# Patient Record
Sex: Female | Born: 1970 | Race: Black or African American | Hispanic: No | Marital: Single | Smoking: Never smoker
Health system: Southern US, Community
[De-identification: ages and names within clinical notes are randomized; demographics above are authoritative.]

## PROBLEM LIST (undated history)

## (undated) DIAGNOSIS — K219 Gastro-esophageal reflux disease without esophagitis: Secondary | ICD-10-CM

## (undated) DIAGNOSIS — N3281 Overactive bladder: Secondary | ICD-10-CM

## (undated) DIAGNOSIS — R053 Chronic cough: Secondary | ICD-10-CM

## (undated) DIAGNOSIS — D219 Benign neoplasm of connective and other soft tissue, unspecified: Secondary | ICD-10-CM

## (undated) DIAGNOSIS — I10 Essential (primary) hypertension: Secondary | ICD-10-CM

## (undated) DIAGNOSIS — N390 Urinary tract infection, site not specified: Secondary | ICD-10-CM

## (undated) DIAGNOSIS — U071 COVID-19: Secondary | ICD-10-CM

## (undated) DIAGNOSIS — R05 Cough: Secondary | ICD-10-CM

---

## 2006-02-05 HISTORY — PX: ABDOMINAL HYSTERECTOMY: SHX81

## 2011-11-01 ENCOUNTER — Other Ambulatory Visit: Payer: Self-pay

## 2011-11-01 ENCOUNTER — Encounter (HOSPITAL_COMMUNITY): Payer: Self-pay | Admitting: *Deleted

## 2011-11-01 ENCOUNTER — Emergency Department (HOSPITAL_COMMUNITY): Payer: Medicaid - Out of State

## 2011-11-01 ENCOUNTER — Observation Stay (HOSPITAL_COMMUNITY)
Admission: EM | Admit: 2011-11-01 | Discharge: 2011-11-02 | Disposition: A | Discharge status: Home / Self Care | Payer: Medicaid - Out of State | Attending: Emergency Medicine | Admitting: Emergency Medicine

## 2011-11-01 DIAGNOSIS — J45909 Unspecified asthma, uncomplicated: Secondary | ICD-10-CM | POA: Insufficient documentation

## 2011-11-01 DIAGNOSIS — Z79899 Other long term (current) drug therapy: Secondary | ICD-10-CM | POA: Insufficient documentation

## 2011-11-01 DIAGNOSIS — R079 Chest pain, unspecified: Principal | ICD-10-CM | POA: Insufficient documentation

## 2011-11-01 DIAGNOSIS — R0602 Shortness of breath: Secondary | ICD-10-CM | POA: Insufficient documentation

## 2011-11-01 LAB — BASIC METABOLIC PANEL
BUN: 14 mg/dL (ref 6–23)
CO2: 27 mEq/L (ref 19–32)
Calcium: 9.2 mg/dL (ref 8.4–10.5)
Chloride: 106 mEq/L (ref 96–112)
Creatinine, Ser: 0.82 mg/dL (ref 0.50–1.10)
GFR calc Af Amer: >90 mL/min (ref >90)
GFR calc non Af Amer: 87 mL/min — ABNORMAL LOW (ref >90)
Glucose, Bld: 86 mg/dL (ref 70–99)
Potassium: 4.2 mEq/L (ref 3.5–5.1)
Sodium: 141 mEq/L (ref 135–145)

## 2011-11-01 LAB — CBC
HCT: 35.4 % — ABNORMAL LOW (ref 36.0–46.0)
Hemoglobin: 11.6 g/dL — ABNORMAL LOW (ref 12.0–15.0)
MCH: 25.9 pg — ABNORMAL LOW (ref 26.0–34.0)
MCHC: 32.8 g/dL (ref 30.0–36.0)
MCV: 79 fL (ref 78.0–100.0)
Platelets: 268 10*3/uL (ref 150–400)
RBC: 4.48 MIL/uL (ref 3.87–5.11)
RDW: 12.5 % (ref 11.5–15.5)
WBC: 8.7 10*3/uL (ref 4.0–10.5)

## 2011-11-01 LAB — TROPONIN I: Troponin I: <0.3 ng/mL (ref <0.30)

## 2011-11-01 LAB — POCT I-STAT TROPONIN I
Troponin i, poc: 0 ng/mL (ref 0.00–0.08)
Troponin i, poc: 0 ng/mL (ref 0.00–0.08)

## 2011-11-01 LAB — DIFFERENTIAL
Basophils Absolute: 0 10*3/uL (ref 0.0–0.1)
Basophils Relative: 0 % (ref 0–1)
Eosinophils Absolute: 0.2 10*3/uL (ref 0.0–0.7)
Eosinophils Relative: 2 % (ref 0–5)
Lymphocytes Relative: 38 % (ref 12–46)
Lymphs Abs: 3.4 10*3/uL (ref 0.7–4.0)
Monocytes Absolute: 0.8 10*3/uL (ref 0.1–1.0)
Monocytes Relative: 9 % (ref 3–12)
Neutro Abs: 4.4 10*3/uL (ref 1.7–7.7)
Neutrophils Relative %: 51 % (ref 43–77)

## 2011-11-01 MED ORDER — ASPIRIN 81 MG PO CHEW
324.0000 mg | CHEWABLE_TABLET | Freq: Once | ORAL | Status: AC
  Administered 2011-11-01: 324 mg via ORAL
  Filled 2011-11-01: qty 4

## 2011-11-01 MED ORDER — NITROGLYCERIN 0.4 MG SL SUBL
0.4000 mg | SUBLINGUAL_TABLET | SUBLINGUAL | Status: DC | PRN
Start: 2011-11-01 — End: 2011-11-02
  Administered 2011-11-01 (×2): 0.4 mg via SUBLINGUAL

## 2011-11-01 MED ORDER — ACETAMINOPHEN 325 MG PO TABS
650.0000 mg | ORAL_TABLET | Freq: Once | ORAL | Status: AC
  Administered 2011-11-01: 650 mg via ORAL
  Filled 2011-11-01: qty 2

## 2011-11-01 NOTE — ED Notes (Signed)
Pt given [place] sandwich, pt informed that at 04:00 on 11/02/11 she is to be NPO

## 2011-11-01 NOTE — ED Provider Notes (Cosign Needed)
History     CSN: [id number]  Arrival date & time 11/01/11  1726   First MD Initiated Contact with Patient 11/01/11 1815      Chief Complaint  Patient presents with  . Chest Pain    (Consider location/radiation/quality/duration/timing/severity/associated sxs/prior treatment) Patient is a 41 y.o. female presenting with chest pain. The history is provided by the patient.  Chest Pain Primary symptoms include shortness of breath and palpitations. Pertinent negatives for primary symptoms include no fever, no cough, no abdominal pain, no nausea and no vomiting.  The palpitations also occurred with shortness of breath.     the patient is a 41 year old, female, with a history of asthma, who presents to emergency department complaining of intermittent chest pain.  For the last 2 days.  She says the pain is on the left side.  It goes under her breast and into her left arm.  It lasts minutes at a time.  He comes and goes.  Several times a day.  It occurs when she is ambulatory, as well as when she is at rest.  She is shortness of breath, associated with it.  She has not had nausea or vomiting associated with it.  Last night.  She was diaphoretic while she was in bed.  She denies leg pain or swelling.  She does not smoke cigarettes.  She denies diabetes or hypertension.  She denies a family history of coronary artery disease.  She took aspirin last 2 days for her pain.  Her symptoms have persisted, and, therefore, she came to the emergency department for further evaluation.  She is recently moved from [place] and does not have a local physician.  Past Medical History  Diagnosis Date  . Asthma     History reviewed. No pertinent past surgical history.  History reviewed. No pertinent family history.  History  Substance Use Topics  . Smoking status: Not on file  . Smokeless tobacco: Not on file  . Alcohol Use: No    OB History    Grav Para Term Preterm Abortions TAB SAB Ect Mult Living                Review of Systems  Constitutional: Negative for fever and chills.  Respiratory: Positive for shortness of breath. Negative for cough.   Cardiovascular: Positive for chest pain and palpitations. Negative for leg swelling.  Gastrointestinal: Negative for nausea, vomiting and abdominal pain.  Neurological: Negative for headaches.  Psychiatric/Behavioral: Negative for confusion.  All other systems reviewed and are negative.    Allergies  Review of patient's allergies indicates no known allergies.  Home Medications   Current Outpatient Rx  Name Route Sig Dispense Refill  . ALBUTEROL SULFATE HFA 108 (90 BASE) MCG/ACT IN AERS Inhalation Inhale 2 puffs into the lungs every 6 (six) hours as needed. For shortness of breath    . FLUTICASONE-SALMETEROL 250-50 MCG/DOSE IN AEPB Inhalation Inhale 1 puff into the lungs every 12 (twelve) hours.    Marland Kitchen LORATADINE 10 MG PO TABS Oral Take 10 mg by mouth daily.      BP 132/89  Pulse 85  Temp(Src) 98.7 F (37.1 C) (Oral)  Resp 18  SpO2 100%  Physical Exam  Vitals reviewed. Constitutional: She is oriented to person, place, and time. She appears well-developed and well-nourished.  HENT:  Head: Normocephalic and atraumatic.  Eyes: Pupils are equal, round, and reactive to light.  Neck: Normal range of motion.  Cardiovascular: Normal rate, regular rhythm and normal heart  sounds.   No murmur heard. Pulmonary/Chest: Effort normal and breath sounds normal. No respiratory distress. She has no wheezes. She has no rales. She exhibits tenderness.       Left-sided chest wall tenderness  Abdominal: Soft. She exhibits no distension and no mass. There is no tenderness. There is no rebound and no guarding.  Musculoskeletal: Normal range of motion. She exhibits no edema and no tenderness.  Neurological: She is alert and oriented to person, place, and time. No cranial nerve deficit.  Skin: Skin is warm and dry. No rash noted. No erythema.    Psychiatric: She has a normal mood and affect. Her behavior is normal.    ED Course  Procedures (including critical care time) 41 year old, female, with no risk factors for coronary artery disease.  Has been having intermittent chest pain, which radiates into her left arm, associated with shortness of breath.  For the past 2 days.  Her symptoms occur both at rest and with exertion.  We will perform laboratory testing.  An EKG, and chest x-ray, and treat her with nitroglycerin to control her pain  Labs Reviewed  CBC - Abnormal; Notable for the following:    Hemoglobin 11.6 (*)    HCT 35.4 (*)    MCH 25.9 (*)    All other components within normal limits  DIFFERENTIAL  BASIC METABOLIC PANEL   No results found.   No diagnosis found.  Still says pain is intermittent.  It "comes and goes."  Discussed with cardiologist.  He suggested CDU CP protocol.  Discussed with the pa.  She will place on protocol.  MDM  Chest pain Possible angina.   No signs stemi or cardiac damage now.Nicholes Stairs, MD 11/01/11 2034

## 2011-11-01 NOTE — ED Notes (Signed)
Pt undressed, in gown, on monitor, continuous pulse oximetry and blood pressure cuff; family at bedside 

## 2011-11-01 NOTE — ED Notes (Signed)
Reports mid chest pains intermittent x 2 days, having mild sob with exertion. ekg done at triage, no acute distress noted at triage.

## 2011-11-01 NOTE — ED Notes (Signed)
Patient ambulated to bathroom. Gait steady. No complaints at this time 

## 2011-11-01 NOTE — ED Provider Notes (Signed)
Assumed care of patient in the CDU from Dr. Weldon Inches.  Patient put on Chest Pain Protocol.  Patient came into the ED today with chest pain.  Cardiology was already been consulted and recommended putting patient on the CDU chest pain protocol.   Patient given Nitroglycerin and Aspirin prior to transfer to CDU. Patient's CXR was negative.  Patient will have CT coronary in the morning.  9:00 PM Reassessed patient.  Patient reports that her chest pain has improved.  Patient is not feeling short of breath at this time.  Patient resting comfortably.  VSS.  Alert and orientated x 3, Heart RRR, Lungs CTAB.  Mild left sided chest wall tenderness to palpation.  Patient awaiting CT coronary in the morning.  12:16 AM Reassessed patient.  Patient resting comfortably.  VSS.  Alert and orientated.  Patient reports that her pain is controlled at this time.    Pascal Lux [place], PA-C 11/02/11 [phone]

## 2011-11-02 ENCOUNTER — Observation Stay (HOSPITAL_COMMUNITY): Payer: Medicaid - Out of State

## 2011-11-02 LAB — POCT I-STAT TROPONIN I: Troponin i, poc: 0 ng/mL (ref 0.00–0.08)

## 2011-11-02 MED ORDER — METOPROLOL TARTRATE 25 MG PO TABS: 100.0000 mg | ORAL_TABLET | Freq: Once | ORAL | Status: DC

## 2011-11-02 MED ORDER — METOPROLOL TARTRATE 1 MG/ML IV SOLN
INTRAVENOUS | Status: AC
  Filled 2011-11-02: qty 5

## 2011-11-02 MED ORDER — NITROGLYCERIN 0.4 MG SL SUBL
SUBLINGUAL_TABLET | SUBLINGUAL | Status: AC
  Filled 2011-11-02: qty 25

## 2011-11-02 MED ORDER — METOPROLOL TARTRATE 1 MG/ML IV SOLN
5.0000 mg | Freq: Once | INTRAVENOUS | Status: AC
  Administered 2011-11-02: 5 mg via INTRAVENOUS

## 2011-11-02 MED ORDER — METOPROLOL TARTRATE 25 MG PO TABS
50.0000 mg | ORAL_TABLET | Freq: Once | ORAL | Status: AC
  Administered 2011-11-02: 100 mg via ORAL
  Filled 2011-11-02 (×2): qty 2

## 2011-11-02 MED ORDER — METOPROLOL TARTRATE 25 MG PO TABS: 50.0000 mg | ORAL_TABLET | Freq: Once | ORAL | Status: DC

## 2011-11-02 MED ORDER — NITROGLYCERIN 0.4 MG SL SUBL
0.4000 mg | SUBLINGUAL_TABLET | Freq: Once | SUBLINGUAL | Status: AC
  Administered 2011-11-02: 0.4 mg via SUBLINGUAL

## 2011-11-02 NOTE — ED Provider Notes (Signed)
7:23 AM patient is in CDU under observation-chest pain protocol.  Patient reports no problems overnight.  Denies any chest pain or shortness of breath currently.  On exam, pt is A&Ox4, NAD, RRR, no m/r/g, CTAB, abd soft, NT, extremities without edema, distal pulses intact and equal bilaterally.  Plan is for coronary CT this morning.  Will continue to follow.  Received a call from Dr Reche Dixon.  Study is negative.  Patient to be discharged home with PCP follow up.  Discussed findings with patient.  Patient verbalizes understanding and agrees with plan.   Rise Patience, [place] 11/02/11 1140

## 2011-11-02 NOTE — ED Provider Notes (Signed)
Medical screening examination/treatment/procedure(s) were conducted as a shared visit with non-physician practitioner(s) and myself.  I personally evaluated the patient during the encounter  Nicholes Stairs, MD 11/02/11 [phone]

## 2011-11-02 NOTE — Progress Notes (Signed)
Observation review is complete. 

## 2011-11-02 NOTE — ED Provider Notes (Signed)
Medical screening examination/treatment/procedure(s) were conducted as a shared visit with non-physician practitioner(s) and myself.  I personally evaluated the patient during the encounter  Joyce Stairs, MD 11/02/11 [phone]

## 2012-07-15 ENCOUNTER — Other Ambulatory Visit: Payer: Self-pay | Admitting: Internal Medicine

## 2012-07-15 DIAGNOSIS — R109 Unspecified abdominal pain: Secondary | ICD-10-CM

## 2012-07-22 ENCOUNTER — Ambulatory Visit
Admission: RE | Admit: 2012-07-22 | Discharge: 2012-07-22 | Disposition: A | Discharge status: Home / Self Care | Payer: Medicaid Other | Source: Ambulatory Visit | Attending: Internal Medicine | Admitting: Internal Medicine

## 2012-07-22 DIAGNOSIS — R109 Unspecified abdominal pain: Secondary | ICD-10-CM

## 2012-08-19 ENCOUNTER — Encounter (HOSPITAL_COMMUNITY): Payer: Self-pay | Admitting: *Deleted

## 2012-08-19 ENCOUNTER — Emergency Department (HOSPITAL_COMMUNITY)
Admission: EM | Admit: 2012-08-19 | Discharge: 2012-08-19 | Disposition: A | Discharge status: Home / Self Care | Payer: Medicaid Other | Source: Home / Self Care

## 2012-08-19 DIAGNOSIS — S39012A Strain of muscle, fascia and tendon of lower back, initial encounter: Secondary | ICD-10-CM

## 2012-08-19 DIAGNOSIS — S335XXA Sprain of ligaments of lumbar spine, initial encounter: Secondary | ICD-10-CM

## 2012-08-19 HISTORY — DX: Overactive bladder: N32.81

## 2012-08-19 LAB — POCT URINALYSIS DIP (DEVICE)
Bilirubin Urine: NEGATIVE
Glucose, UA: NEGATIVE mg/dL
Ketones, ur: NEGATIVE mg/dL
Leukocytes, UA: NEGATIVE
Nitrite: NEGATIVE
Protein, ur: NEGATIVE mg/dL
Specific Gravity, Urine: 1.015 (ref 1.005–1.030)
Urobilinogen, UA: 0.2 mg/dL (ref 0.0–1.0)
pH: 6 (ref 5.0–8.0)

## 2012-08-19 MED ORDER — TRAMADOL HCL 50 MG PO TABS: 50.0000 mg | ORAL_TABLET | Freq: Four times a day (QID) | ORAL | Status: DC | PRN

## 2012-08-19 NOTE — ED Notes (Signed)
Pt reports " i just feel bad. I have so many things going on - it started Thursday. My ears hurts and my chest is sore and I started coughing last night. My lower back hurts."  Pt denies burning when she pees - has hx of OAB

## 2012-08-19 NOTE — ED Notes (Signed)
Medication called into [facility] on [address]. Tramadol 50mg  Q6 hours PRN for pain. 15 tablets.

## 2012-08-19 NOTE — ED Provider Notes (Signed)
History     CSN: [id number]  Arrival date & time 08/19/12  1134   None     Chief Complaint  Patient presents with  . URI  . Back Pain  . Hemorrhoids    (Consider location/radiation/quality/duration/timing/severity/associated sxs/prior treatment) HPI Comments: 41 year old female with chief complaint of low back pain for 4 days. She denies any known injury or move that may have produced her pain. There is pain and tenderness the left low back in the para lumbar musculature. Is worse with sitting and better with standing. She denies any focal neurologic symptoms or radicular pain. She denies urinary symptoms.  2 weeks ago she had an ear infection and saw her PCP. She was treated with an antibiotic and now that is somewhat better but she still had some ear discomfort.  She does not have any urinary tract symptoms.  Patient is a 41 y.o. female presenting with URI and back pain.  URI The primary symptoms include fatigue and ear pain. Primary symptoms do not include fever or sore throat.  Symptoms associated with the illness include congestion.  Back Pain  Pertinent negatives include no fever.    Past Medical History  Diagnosis Date  . Asthma   . OAB (overactive bladder)     Past Surgical History  Procedure Date  . Abdominal hysterectomy     Family History  Problem Relation Age of Onset  . Family history unknown: Yes    History  Substance Use Topics  . Smoking status: Not on file  . Smokeless tobacco: Not on file  . Alcohol Use: No    OB History    Grav Para Term Preterm Abortions TAB SAB Ect Mult Living   1 1 1       1       Review of Systems  Constitutional: Positive for fatigue. Negative for fever.  HENT: Positive for ear pain and congestion. Negative for sore throat, neck stiffness and ear discharge.   Respiratory: Negative.   Cardiovascular: Negative.   Gastrointestinal: Negative.   Genitourinary: Negative.   Musculoskeletal: Positive for back pain.    Neurological: Negative.   Psychiatric/Behavioral: Negative.     Allergies  Review of patient's allergies indicates no known allergies.  Home Medications   Current Outpatient Rx  Name  Route  Sig  Dispense  Refill  . LORATADINE 10 MG PO TABS   Oral   Take 10 mg by mouth daily.         . ALBUTEROL SULFATE HFA 108 (90 BASE) MCG/ACT IN AERS   Inhalation   Inhale 2 puffs into the lungs every 6 (six) hours as needed. For shortness of breath         . BISACODYL 5 MG PO TBEC   Oral   Take 5 mg by mouth daily as needed.         Marland Kitchen FLUTICASONE-SALMETEROL 250-50 MCG/DOSE IN AEPB   Inhalation   Inhale 1 puff into the lungs every 12 (twelve) hours.         . SOLIFENACIN SUCCINATE 10 MG PO TABS   Oral   Take by mouth daily.           BP 128/82  Pulse 78  Temp 98.6 F (37 C) (Oral)  Resp 18  SpO2 100%  Physical Exam  Constitutional: She is oriented to person, place, and time. She appears well-developed and well-nourished. No distress.  HENT:  Mouth/Throat: No oropharyngeal exudate.       Bilateral  TMs without erythema. They are pearly gray and translucent without effusion. The left TM has mild retraction. Oropharynx is clear with scant clear PND  Eyes: Conjunctivae normal and EOM are normal.  Neck: Normal range of motion. Neck supple.  Pulmonary/Chest: Effort normal.  Musculoskeletal: Normal range of motion. She exhibits no edema and no tenderness.  Lymphadenopathy:    She has no cervical adenopathy.  Neurological: She is alert and oriented to person, place, and time. No cranial nerve deficit.  Skin: Skin is warm and dry.  Psychiatric: She has a normal mood and affect.    ED Course  Procedures (including critical care time)  Labs Reviewed  POCT URINALYSIS DIP (DEVICE) - Abnormal; Notable for the following:    Hgb urine dipstick SMALL (*)     All other components within normal limits   No results found.   1. Lumbar strain       MDM  Apply heat to  the sore areas as directed. Stretches as demonstrated. No Heavy lifting pulling pushing May take ibuprofen at home when necessary as previously directed. For any upper respiratory congestion head phenylephrine 10 mg by mouth every 4 hours as needed. He may take this along with your loratadine.        Hayden Rasmussen, NP 08/19/12 1402

## 2012-08-20 NOTE — ED Provider Notes (Signed)
Medical screening examination/treatment/procedure(s) were performed by resident physician or non-physician practitioner and as supervising physician I was immediately available for consultation/collaboration.   KINDL,JAMES DOUGLAS MD.    James D Kindl, MD 08/20/12 1048 

## 2012-12-26 ENCOUNTER — Other Ambulatory Visit: Payer: Self-pay

## 2012-12-26 ENCOUNTER — Other Ambulatory Visit: Payer: Self-pay | Admitting: Internal Medicine

## 2012-12-26 DIAGNOSIS — Z1231 Encounter for screening mammogram for malignant neoplasm of breast: Secondary | ICD-10-CM

## 2013-01-06 ENCOUNTER — Emergency Department (HOSPITAL_COMMUNITY)
Admission: EM | Admit: 2013-01-06 | Discharge: 2013-01-06 | Disposition: A | Discharge status: Home / Self Care | Payer: Medicaid Other | Attending: Emergency Medicine | Admitting: Emergency Medicine

## 2013-01-06 ENCOUNTER — Encounter (HOSPITAL_COMMUNITY): Payer: Self-pay | Admitting: *Deleted

## 2013-01-06 DIAGNOSIS — M545 Low back pain, unspecified: Secondary | ICD-10-CM | POA: Insufficient documentation

## 2013-01-06 DIAGNOSIS — R110 Nausea: Secondary | ICD-10-CM | POA: Insufficient documentation

## 2013-01-06 DIAGNOSIS — R350 Frequency of micturition: Secondary | ICD-10-CM | POA: Insufficient documentation

## 2013-01-06 DIAGNOSIS — Z90710 Acquired absence of both cervix and uterus: Secondary | ICD-10-CM | POA: Insufficient documentation

## 2013-01-06 DIAGNOSIS — Z79899 Other long term (current) drug therapy: Secondary | ICD-10-CM | POA: Insufficient documentation

## 2013-01-06 DIAGNOSIS — Z8742 Personal history of other diseases of the female genital tract: Secondary | ICD-10-CM | POA: Insufficient documentation

## 2013-01-06 DIAGNOSIS — J45909 Unspecified asthma, uncomplicated: Secondary | ICD-10-CM | POA: Insufficient documentation

## 2013-01-06 LAB — URINALYSIS, ROUTINE W REFLEX MICROSCOPIC
Bilirubin Urine: NEGATIVE
Glucose, UA: NEGATIVE mg/dL
Hgb urine dipstick: NEGATIVE
Ketones, ur: NEGATIVE mg/dL
Leukocytes, UA: NEGATIVE
Nitrite: NEGATIVE
Protein, ur: NEGATIVE mg/dL
Specific Gravity, Urine: 1.016 (ref 1.005–1.030)
Urobilinogen, UA: 0.2 mg/dL (ref 0.0–1.0)
pH: 6.5 (ref 5.0–8.0)

## 2013-01-06 MED ORDER — TRAMADOL HCL 50 MG PO TABS: 50.0000 mg | ORAL_TABLET | Freq: Four times a day (QID) | ORAL | Status: DC | PRN

## 2013-01-06 MED ORDER — PHENAZOPYRIDINE HCL 200 MG PO TABS: 200.0000 mg | ORAL_TABLET | Freq: Three times a day (TID) | ORAL | Status: DC

## 2013-01-06 NOTE — ED Notes (Signed)
Pt states she was dx with UTI 1 week ago, finished pyridium and pain has gotten worse since.  Denies n/v, no fevers/chills.  Pt is still finishing abx course.

## 2013-01-06 NOTE — ED Notes (Signed)
Patient is alert and orientedx4.  Patient was explained discharge instructions and they understood them with no questions.   

## 2013-01-06 NOTE — ED Notes (Signed)
Patient went to her PCP for the same thing and they gave her Cipro and Pyridium.  The patient says she has been urinating on herself and her pain has gotten worse.  The patient decided to come to the ED to be evaluated.

## 2013-01-06 NOTE — ED Provider Notes (Signed)
History  This chart was scribed for non-physician practitioner working with Glynn Octave, MD by Erskine Emery, ED Scribe. This patient was seen in room TR07C/TR07C and the patient's care was started at 21:17.   CSN: [id number]  Arrival date & time 01/06/13  2117   First MD Initiated Contact with Patient 01/06/13 2210      Chief Complaint  Patient presents with  . Urinary Frequency    (Consider location/radiation/quality/duration/timing/severity/associated sxs/prior treatment) The history is provided by the patient. No language interpreter was used.  Joyce Welch is a 42 y.o. female who presents to the Emergency Department complaining of urinary frequency. Pt was tx for a UTI last week and prescribed pyridium which she finished and antibiotics (Cipro) which she is still taking. She claims her pain has only gotten worse since then. She reports some associated back pain and nausea.  Denies any dysuria, hematuria, abdominal pain, fever, chills, or vaginal discharge. Pt has a h/o overactive bladder but no h/o kidney stones.  No loss of bowel or bladder function.  Past Medical History  Diagnosis Date  . Asthma   . OAB (overactive bladder)     Past Surgical History  Procedure Laterality Date  . Abdominal hysterectomy    . Cesarean section      History reviewed. No pertinent family history.  History  Substance Use Topics  . Smoking status: Never Smoker   . Smokeless tobacco: Not on file  . Alcohol Use: No    OB History   Grav Para Term Preterm Abortions TAB SAB Ect Mult Living   1 1 1       1       Review of Systems  Constitutional: Negative for fever.  Genitourinary: Positive for frequency.  Musculoskeletal: Positive for back pain.  All other systems reviewed and are negative.    Allergies  Review of patient's allergies indicates no known allergies.  Home Medications   Current Outpatient Rx  Name  Route  Sig  Dispense  Refill  . albuterol (PROVENTIL HFA;VENTOLIN  HFA) 108 (90 BASE) MCG/ACT inhaler   Inhalation   Inhale 2 puffs into the lungs every 6 (six) hours as needed. For shortness of breath         . bisacodyl (DULCOLAX) 5 MG EC tablet   Oral   Take 5 mg by mouth daily as needed.         . Fluticasone-Salmeterol (ADVAIR) 250-50 MCG/DOSE AEPB   Inhalation   Inhale 1 puff into the lungs every 12 (twelve) hours.         Marland Kitchen loratadine (CLARITIN) 10 MG tablet   Oral   Take 10 mg by mouth daily.         . solifenacin (VESICARE) 10 MG tablet   Oral   Take by mouth daily.         . traMADol (ULTRAM) 50 MG tablet   Oral   Take 1 tablet (50 mg total) by mouth every 6 (six) hours as needed for pain.   15 tablet   0     Triage Vitals: BP 133/83  Pulse 91  Temp(Src) 98.3 F (36.8 C) (Oral)  Resp 24  SpO2 100%  Physical Exam  Nursing note and vitals reviewed. Constitutional: She is oriented to person, place, and time. She appears well-developed and well-nourished.  HENT:  Head: Normocephalic and atraumatic.  Mouth/Throat: Oropharynx is clear and moist.  Eyes: Conjunctivae and EOM are normal. Pupils are equal, round, and reactive to light.  Neck: Normal range of motion.  Cardiovascular: Normal rate, regular rhythm and normal heart sounds.   Pulmonary/Chest: Effort normal and breath sounds normal.  Abdominal: Soft. Bowel sounds are normal. There is no tenderness. There is no CVA tenderness.  Musculoskeletal: Normal range of motion.  Pain localized to left LS- no bony tenderness, deformity, or spasm; normal ROM and sensation  Neurological: She is alert and oriented to person, place, and time.  Skin: Skin is warm and dry.  Psychiatric: She has a normal mood and affect.    ED Course  Procedures (including critical care time) DIAGNOSTIC STUDIES: Oxygen Saturation is 100% on room air, normal by my interpretation.    COORDINATION OF CARE: 23:00--I evaluated the patient and we discussed a treatment plan including urinalysis  and continued use of antibiotics to which the pt agreed. I notified the pt that her urine is completely normal.   Results for orders placed during the hospital encounter of 01/06/13  URINALYSIS, ROUTINE W REFLEX MICROSCOPIC      Result Value Range   Color, Urine YELLOW  YELLOW   APPearance CLEAR  CLEAR   Specific Gravity, Urine 1.016  1.005 - 1.030   pH 6.5  5.0 - 8.0   Glucose, UA NEGATIVE  NEGATIVE mg/dL   Hgb urine dipstick NEGATIVE  NEGATIVE   Bilirubin Urine NEGATIVE  NEGATIVE   Ketones, ur NEGATIVE  NEGATIVE mg/dL   Protein, ur NEGATIVE  NEGATIVE mg/dL   Urobilinogen, UA 0.2  0.0 - 1.0 mg/dL   Nitrite NEGATIVE  NEGATIVE   Leukocytes, UA NEGATIVE  NEGATIVE       1. Increased urinary frequency       MDM   Dx with UTI last week, currently taking ciprofloxacin with 4 days of tx remaining.  Continues to have increased urinary frequency without dysuria.  U/a negative for infection- culture pending.  Back pain is localized to left LS, not CVA.  Continue with abx.  Rx pyridium and tramadol given.  FU with PCP if sx remain after finishing abx.  Return precautions advised.  I personally performed the services described in this documentation, which was scribed in my presence. The recorded information has been reviewed and is accurate.    Garlon Hatchet, PA-C 01/07/13 1403  Garlon Hatchet, PA-C 01/07/13 [phone]

## 2013-01-08 LAB — URINE CULTURE
Colony Count: NO GROWTH
Culture: NO GROWTH

## 2013-01-08 NOTE — ED Provider Notes (Signed)
Medical screening examination/treatment/procedure(s) were performed by non-physician practitioner and as supervising physician I was immediately available for consultation/collaboration.  Glynn Octave, MD 01/08/13 1000

## 2013-01-22 ENCOUNTER — Ambulatory Visit: Payer: Medicaid Other | Attending: Physician Assistant | Admitting: Physical Therapy

## 2013-01-22 DIAGNOSIS — M25669 Stiffness of unspecified knee, not elsewhere classified: Secondary | ICD-10-CM | POA: Insufficient documentation

## 2013-01-22 DIAGNOSIS — IMO0001 Reserved for inherently not codable concepts without codable children: Secondary | ICD-10-CM | POA: Insufficient documentation

## 2013-01-22 DIAGNOSIS — M25569 Pain in unspecified knee: Secondary | ICD-10-CM | POA: Insufficient documentation

## 2013-01-30 ENCOUNTER — Ambulatory Visit: Payer: Medicaid Other | Attending: Physician Assistant | Admitting: Physical Therapy

## 2013-01-30 DIAGNOSIS — M25669 Stiffness of unspecified knee, not elsewhere classified: Secondary | ICD-10-CM | POA: Insufficient documentation

## 2013-01-30 DIAGNOSIS — M25569 Pain in unspecified knee: Secondary | ICD-10-CM | POA: Insufficient documentation

## 2013-01-30 DIAGNOSIS — IMO0001 Reserved for inherently not codable concepts without codable children: Secondary | ICD-10-CM | POA: Insufficient documentation

## 2013-02-04 ENCOUNTER — Ambulatory Visit
Admission: RE | Admit: 2013-02-04 | Discharge: 2013-02-04 | Disposition: A | Discharge status: Home / Self Care | Payer: Medicaid Other | Source: Ambulatory Visit

## 2013-02-04 DIAGNOSIS — Z1231 Encounter for screening mammogram for malignant neoplasm of breast: Secondary | ICD-10-CM

## 2013-02-07 ENCOUNTER — Ambulatory Visit: Payer: Medicaid Other | Admitting: Physical Therapy

## 2013-05-01 ENCOUNTER — Encounter (HOSPITAL_COMMUNITY): Payer: Self-pay | Admitting: *Deleted

## 2013-05-01 ENCOUNTER — Emergency Department (HOSPITAL_COMMUNITY)
Admission: EM | Admit: 2013-05-01 | Discharge: 2013-05-01 | Disposition: A | Discharge status: Home / Self Care | Payer: Medicaid Other | Source: Home / Self Care | Attending: Family Medicine | Admitting: Family Medicine

## 2013-05-01 DIAGNOSIS — S20229A Contusion of unspecified back wall of thorax, initial encounter: Secondary | ICD-10-CM

## 2013-05-01 HISTORY — DX: Benign neoplasm of connective and other soft tissue, unspecified: D21.9

## 2013-05-01 MED ORDER — DICLOFENAC POTASSIUM 50 MG PO TABS: 50.0000 mg | ORAL_TABLET | Freq: Three times a day (TID) | ORAL | Status: DC

## 2013-05-01 MED ORDER — CYCLOBENZAPRINE HCL 5 MG PO TABS: 5.0000 mg | ORAL_TABLET | Freq: Three times a day (TID) | ORAL | Status: DC | PRN

## 2013-05-01 MED ORDER — KETOROLAC TROMETHAMINE 30 MG/ML IJ SOLN
INTRAMUSCULAR | Status: AC
  Filled 2013-05-01: qty 1

## 2013-05-01 MED ORDER — KETOROLAC TROMETHAMINE 30 MG/ML IJ SOLN
30.0000 mg | Freq: Once | INTRAMUSCULAR | Status: AC
  Administered 2013-05-01: 30 mg via INTRAMUSCULAR

## 2013-05-01 NOTE — ED Provider Notes (Signed)
CSN: [id number]     Arrival date & time 05/01/13  1758 History     First MD Initiated Contact with Patient 05/01/13 1814     Chief Complaint  Patient presents with  . Fall   (Consider location/radiation/quality/duration/timing/severity/associated sxs/prior Treatment) Patient is a 42 y.o. female presenting with fall. The history is provided by the patient.  Fall This is a new problem. The current episode started 3 to 5 hours ago (chasing a yorkie in house and slipped on floor and landed on back, still sore, no neuro sx.). The problem has been gradually worsening. Pertinent negatives include no chest pain, no abdominal pain and no headaches.    Past Medical History  Diagnosis Date  . Asthma   . OAB (overactive bladder)   . Fibroids    Past Surgical History  Procedure Laterality Date  . Cesarean section  1994, 2002, 2005,     x 3  . Abdominal hysterectomy  02/05/2006    partial   History reviewed. No pertinent family history. History  Substance Use Topics  . Smoking status: Never Smoker   . Smokeless tobacco: Not on file  . Alcohol Use: No   OB History   Grav Para Term Preterm Abortions TAB SAB Ect Mult Living   1 1 1       1      Review of Systems  Constitutional: Negative.   HENT: Negative for neck pain.   Cardiovascular: Negative for chest pain.  Gastrointestinal: Negative for abdominal pain.  Genitourinary: Negative.   Musculoskeletal: Positive for back pain. Negative for joint swelling and gait problem.  Neurological: Negative for dizziness, speech difficulty, weakness and headaches.    Allergies  Review of patient's allergies indicates no known allergies.  Home Medications   Current Outpatient Rx  Name  Route  Sig  Dispense  Refill  . albuterol (PROVENTIL HFA;VENTOLIN HFA) 108 (90 BASE) MCG/ACT inhaler   Inhalation   Inhale 2 puffs into the lungs every 6 (six) hours as needed. For shortness of breath         . loratadine (CLARITIN) 10 MG tablet    Oral   Take 10 mg by mouth daily.         . bisacodyl (DULCOLAX) 5 MG EC tablet   Oral   Take 5 mg by mouth daily.          . ciprofloxacin (CIPRO) 250 MG tablet   Oral   Take 250 mg by mouth 2 (two) times daily. Started 4.1.14 ending 4.11.14 for 10 days         . cyclobenzaprine (FLEXERIL) 5 MG tablet   Oral   Take 1 tablet (5 mg total) by mouth 3 (three) times daily as needed for muscle spasms.   30 tablet   0   . diclofenac (CATAFLAM) 50 MG tablet   Oral   Take 1 tablet (50 mg total) by mouth 3 (three) times daily. For back pain.   21 tablet   0   . Fluticasone-Salmeterol (ADVAIR) 250-50 MCG/DOSE AEPB   Inhalation   Inhale 1 puff into the lungs every 12 (twelve) hours as needed (for asthma).          . phenazopyridine (PYRIDIUM) 200 MG tablet   Oral   Take 1 tablet (200 mg total) by mouth 3 (three) times daily.   9 tablet   0   . solifenacin (VESICARE) 10 MG tablet   Oral   Take 10 mg by mouth daily.          Marland Kitchen  traMADol (ULTRAM) 50 MG tablet   Oral   Take 1 tablet (50 mg total) by mouth every 6 (six) hours as needed for pain.   15 tablet   0    BP 127/85  Pulse 90  Temp(Src) 98.3 F (36.8 C) (Oral)  Resp 20  SpO2 99% Physical Exam  Nursing note and vitals reviewed. Constitutional: She is oriented to person, place, and time. She appears well-developed and well-nourished. No distress.  HENT:  Head: Normocephalic and atraumatic.  Eyes: Conjunctivae are normal. Pupils are equal, round, and reactive to light.  Neck: Normal range of motion. Neck supple.  Pulmonary/Chest: She exhibits no tenderness.  Abdominal: Soft. There is no tenderness.  Musculoskeletal: Normal range of motion. She exhibits tenderness.       Lumbar back: She exhibits tenderness, pain and spasm. She exhibits normal range of motion, no bony tenderness, no swelling, no edema and normal pulse.       Back:  Neurological: She is alert and oriented to person, place, and time.  Skin:  Skin is warm and dry.    ED Course   Procedures (including critical care time)  Labs Reviewed - No data to display No results found. 1. Back contusion, unspecified laterality, initial encounter     MDM    Linna Hoff, MD 05/01/13 [phone]

## 2013-05-01 NOTE — ED Notes (Signed)
Chasing dog @ 1300 and fell.  States she landed on her back hard and her R hand.  No hx. of back trouble.  C/o headache and low back pain.

## 2013-06-26 ENCOUNTER — Encounter: Payer: Self-pay | Admitting: Obstetrics & Gynecology

## 2013-07-08 ENCOUNTER — Encounter (HOSPITAL_COMMUNITY): Payer: Self-pay | Admitting: Emergency Medicine

## 2013-07-08 ENCOUNTER — Emergency Department (INDEPENDENT_AMBULATORY_CARE_PROVIDER_SITE_OTHER): Payer: Medicaid Other

## 2013-07-08 ENCOUNTER — Emergency Department (HOSPITAL_COMMUNITY)
Admission: EM | Admit: 2013-07-08 | Discharge: 2013-07-08 | Disposition: A | Discharge status: Home / Self Care | Payer: Medicaid Other | Source: Home / Self Care | Attending: Emergency Medicine | Admitting: Emergency Medicine

## 2013-07-08 DIAGNOSIS — IMO0001 Reserved for inherently not codable concepts without codable children: Secondary | ICD-10-CM

## 2013-07-08 DIAGNOSIS — S335XXA Sprain of ligaments of lumbar spine, initial encounter: Secondary | ICD-10-CM

## 2013-07-08 DIAGNOSIS — S6390XA Sprain of unspecified part of unspecified wrist and hand, initial encounter: Secondary | ICD-10-CM

## 2013-07-08 DIAGNOSIS — M79644 Pain in right finger(s): Secondary | ICD-10-CM

## 2013-07-08 DIAGNOSIS — M79609 Pain in unspecified limb: Secondary | ICD-10-CM

## 2013-07-08 MED ORDER — TRAMADOL HCL 50 MG PO TABS: 50.0000 mg | ORAL_TABLET | Freq: Four times a day (QID) | ORAL | Status: DC | PRN
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
## 2013-07-08 NOTE — ED Provider Notes (Signed)
Medical screening examination/treatment/procedure(s) were performed by non-physician practitioner and as supervising physician I was immediately available for consultation/collaboration.  Briston Lax, M.D.  Dawsyn Ramsaran C Joslyn Ramos, MD 07/08/13 2032 
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
## 2013-07-30 ENCOUNTER — Encounter: Payer: Self-pay | Admitting: Obstetrics

## 2013-07-30 ENCOUNTER — Ambulatory Visit (INDEPENDENT_AMBULATORY_CARE_PROVIDER_SITE_OTHER): Payer: Medicaid Other | Admitting: Obstetrics

## 2013-07-30 VITALS — BP 125/85 | HR 89 | Temp 98.7°F | Ht 66.0 in | Wt 231.0 lb

## 2013-07-30 DIAGNOSIS — Z0000 Encounter for general adult medical examination without abnormal findings: Secondary | ICD-10-CM

## 2013-07-30 DIAGNOSIS — N951 Menopausal and female climacteric states: Secondary | ICD-10-CM

## 2013-07-30 NOTE — Progress Notes (Signed)
Subjective:     Joyce Welch is a 42 y.o. female here for a routine exam.  Current complaints: Patient wants to establish care. Patient had hysterectomy for fibroids and heavy bleeding. P[atient request a prescription for premarin.  Personal health questionnaire reviewed: yes.   Gynecologic History No LMP recorded. Patient has had a hysterectomy. Contraception: status post hysterectomy Last Pap: 2007 Last mammogram: current- 2014. Results were: normal  Obstetric History OB History  Gravida Para Term Preterm AB SAB TAB Ectopic Multiple Living  1 1 1       1     # Outcome Date GA Lbr Len/2nd Weight Sex Delivery Anes PTL Lv  1 TRM                The following portions of the patient's history were reviewed and updated as appropriate: allergies, current medications, past family history, past medical history, past social history, past surgical history and problem list.  Review of Systems Pertinent items are noted in HPI.    Objective:    General appearance: alert, no distress and mildly obese Breasts: normal appearance, no masses or tenderness Abdomen: soft, non-tender; bowel sounds normal; no masses,  no organomegaly Pelvic: external genitalia normal, no adnexal masses or tenderness, uterus surgically absent and vagina normal without discharge Extremities: extremities normal, atraumatic, no cyanosis or edema    Assessment:    Healthy female exam.   Perimenopause.  Vasomotor symptoms.   Plan:    Education reviewed: calcium supplements, safe sex/STD prevention, self breast exams, weight bearing exercise and trying phytoestrogens.  [facility] Follow up in: 1 year.

## 2013-07-31 DIAGNOSIS — N951 Menopausal and female climacteric states: Secondary | ICD-10-CM | POA: Insufficient documentation

## 2013-07-31 LAB — WET PREP BY MOLECULAR PROBE
Candida species: NEGATIVE
Gardnerella vaginalis: POSITIVE — AB
Trichomonas vaginosis: NEGATIVE

## 2013-08-01 ENCOUNTER — Telehealth: Payer: Self-pay | Admitting: *Deleted

## 2013-08-01 MED ORDER — METRONIDAZOLE 500 MG PO TABS: 500.0000 mg | ORAL_TABLET | Freq: Two times a day (BID) | ORAL | Status: DC

## 2013-08-01 NOTE — Telephone Encounter (Signed)
Message copied by Glendell Docker on Fri Aug 01, 2013  3:16 PM ------      Message from: Coral Ceo A      Created: Fri Aug 01, 2013  6:51 AM       Flagyl 500mg  po bid x 7 days. ------

## 2013-08-01 NOTE — Telephone Encounter (Signed)
Call placed to patient, she was informed of lab results and rx to pharmacy. Questions asked and answered, patient verbalized understanding, and states she will pick the medication up prior to her leaving town.

## 2013-11-30 ENCOUNTER — Emergency Department (HOSPITAL_COMMUNITY)
Admission: EM | Admit: 2013-11-30 | Discharge: 2013-11-30 | Disposition: A | Discharge status: Home / Self Care | Payer: Medicaid Other | Source: Home / Self Care | Attending: Family Medicine | Admitting: Family Medicine

## 2013-11-30 ENCOUNTER — Encounter (HOSPITAL_COMMUNITY): Payer: Self-pay | Admitting: Emergency Medicine

## 2013-11-30 DIAGNOSIS — M222X9 Patellofemoral disorders, unspecified knee: Secondary | ICD-10-CM

## 2013-11-30 DIAGNOSIS — M25569 Pain in unspecified knee: Secondary | ICD-10-CM

## 2013-11-30 MED ORDER — TRAMADOL HCL 50 MG PO TABS: 50.0000 mg | ORAL_TABLET | Freq: Four times a day (QID) | ORAL | Status: DC | PRN

## 2013-11-30 MED ORDER — CYCLOBENZAPRINE HCL 10 MG PO TABS: 10.0000 mg | ORAL_TABLET | Freq: Every evening | ORAL | Status: DC | PRN

## 2013-11-30 MED ORDER — PREDNISONE 10 MG PO KIT: PACK | ORAL | Status: DC

## 2013-11-30 NOTE — ED Provider Notes (Signed)
Joyce Welch is a 43 y.o. female who presents to Urgent Care today for right knee pain. Patient has had right knee pain worsening over the past several days. Around 2 years ago she was incapacitated with patellofemoral pain syndrome and received physical therapy and home exercise program. This worked well enough for her to return to work. She currently works at [employer]. She has not doing home exercise programs over the past 6 months or so and her knee pain has returned. She denies any new injury. She notes anterior to medial knee pain worse with deep knee bending, climbing stairs, getting out of cars. She denies any significant radiating pain weakness or numbness. Additionally she notes. Point tenderness at the distal insertion of the patellar tendon. She has tried ibuprofen which has not been very effective.   Past Medical History  Diagnosis Date  . Asthma   . OAB (overactive bladder)   . Fibroids    History  Substance Use Topics  . Smoking status: Never Smoker   . Smokeless tobacco: Not on file  . Alcohol Use: No   ROS as above Medications: No current facility-administered medications for this encounter.   Current Outpatient Prescriptions  Medication Sig Dispense Refill  . albuterol (PROVENTIL HFA;VENTOLIN HFA) 108 (90 BASE) MCG/ACT inhaler Inhale 2 puffs into the lungs every 6 (six) hours as needed. For shortness of breath      . cyclobenzaprine (FLEXERIL) 10 MG tablet Take 1 tablet (10 mg total) by mouth at bedtime as needed for muscle spasms.  20 tablet  0  . Fluticasone-Salmeterol (ADVAIR) 250-50 MCG/DOSE AEPB Inhale 1 puff into the lungs every 12 (twelve) hours as needed (for asthma).       . loratadine (CLARITIN) 10 MG tablet Take 10 mg by mouth daily.      . PredniSONE 10 MG KIT 12 day dose pack po  1 kit  0  . solifenacin (VESICARE) 10 MG tablet Take 10 mg by mouth daily.       . traMADol (ULTRAM) 50 MG tablet Take 1 tablet (50 mg total) by mouth every 6 (six) hours  as needed.  15 tablet  0    Exam:  BP 115/90  Pulse 94  Temp(Src) 98.2 F (36.8 C) (Oral)  Resp 20  SpO2 100% Gen: Well NAD RIGHT KNEE: Normal-appearing no effusion. Decreased VMO bulk. Range of motion 0-100 with 1+ retropatellar crepitations on extension Tender to palpation distal patella tendon insertion.  Stable ligamentous exam Strength is intact  Capillary refill and sensation are intact distally  Limited musculoskeletal ultrasound of the right knee. Quadriceps tendon is normal-appearing with no significant effusion. Patella tendon is normal-appearing with no irregularities distally. No increased vascular activity at the distal insertion.. No significant pes anserine bursitis.   Assessment and Plan: 43 y.o. female with patellofemoral pain syndrome of the right knee. Pain is recurred after patient stopped her home exercise program. She seems to be in quite a bit of pain currently. Plan to use prednisone dose pack, tramadol, and straight leg raise and foot externally rotated straight leg raise. Additionally refer to [place].  Discussed warning signs or symptoms. Please see discharge instructions. Patient expresses understanding.    Gregor Hams, MD 11/30/13 1213

## 2013-11-30 NOTE — Discharge Instructions (Signed)
Thank you for coming in today. Take prednisone daily for 10 days. Use Flexeril at bedtime as needed for severe pain or muscle spasm Use tramadol for severe pain. Do not drive or work after taking this medication.  Restart your exercises:  Straight leg raises 10-15 repetitions 2 sets 2 times daily Toe rotated outward straight leg raises 10-15 repetitions 2 sets 2 times daily Followup at the [facility] [phone]

## 2013-11-30 NOTE — ED Notes (Signed)
C/o right knee pain  States she was seen by physical therapy Denies any injury

## 2013-12-02 ENCOUNTER — Encounter: Payer: Self-pay | Admitting: Family Medicine

## 2013-12-02 ENCOUNTER — Ambulatory Visit (INDEPENDENT_AMBULATORY_CARE_PROVIDER_SITE_OTHER): Payer: Medicaid Other | Admitting: Family Medicine

## 2013-12-02 VITALS — BP 120/86 | Ht 66.0 in | Wt 234.0 lb

## 2013-12-02 DIAGNOSIS — IMO0002 Reserved for concepts with insufficient information to code with codable children: Secondary | ICD-10-CM

## 2013-12-02 DIAGNOSIS — M7050 Other bursitis of knee, unspecified knee: Secondary | ICD-10-CM

## 2013-12-02 MED ORDER — DICLOFENAC SODIUM 1 % TD GEL: 4.0000 g | Freq: Four times a day (QID) | TRANSDERMAL | Status: DC

## 2013-12-02 NOTE — Progress Notes (Signed)
CC: Right knee pain HPI: Patient presents for evaluation of right knee pain that has been present for about the last year. She states that she did previously have problems with knee pain greater than 3 years ago and was told that she had arthritis and was eventually need a knee replacement. She states that her knee pain did improve temporarily but has returned in the last year since she began working at [employer] again. She has to do a lot of standing as well as kneeling. She notes that her pain is worse with prolonged standing or walking. She does not have any stairs in her house and she is not sure if it is exacerbated by stairs. She does feel like she gets some swelling. Pain is over the anterior aspect of the knee and slightly medial to the tibial tuberosity. She denies any redness. Does not wake her up and night. She states some days are worse than others and it seems primarily worse as she does more activity. She is taking 800 mg Motrin once a day. At times she does feel like her knee is going to give out of her. She was seen in urgent care where she was prescribed prednisone, cyclobenzaprine, and tramadol. She was experiencing a lot of drowsiness from this so her primary Dr. told her to stop everything but the tramadol. She is otherwise not had any therapy for this.  ROS: As above in the HPI. All other systems are stable or negative.  OBJECTIVE: APPEARANCE:  Patient in no acute distress.The patient appeared well nourished and normally developed. HEENT: No scleral icterus. Conjunctiva non-injected Resp: Non labored Skin: No rash MSK:  Knee exam: - No swelling or deformity - Full range of motion with pain in deep flexion - There is mild tenderness over the medial joint line. Significant tenderness over the pens anserine - Strength is 5 out of 5 in knee flexion extension - Pain with patellar compression - No laxity on varus, valgus. Negative Lachman's - Painful McMurray's - Negative  effusion   MSK [place]: Ultrasound of the right knee was performed today. There is no hypoechoic fluid in the suprapatellar pouch so do not see evidence of effusion. There is spurring off of the patella in the area of the quadriceps tendon. Patellar tendon has some increased hypoechoic signal overlying the tendon at the distal insertion at the tibial tuberosity. There is mildly decreased joint space without evidence of meniscal tear or joint effusion on the medial and lateral joint line. At the area of the pens anserine there is a significant amount of hypoechoic change surrounding the tendons consistent with pens anserine bursitis that is present in both longitudinal and transverse views.   ASSESSMENT: #1. 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 Cannot rule out some contribution of knee osteoarthritis.   PLAN: Discussed options for treatment with the patient. Discussed conservative care with medications and modalities versus injection. Patient will like to start with medications and modalities. She was prescribed topical Voltaren to apply 3-4 times a day. She was instructed to ice the knee in the area of the pens anserine about 3 times a day for 10-15 minutes. She was asked to wear knee pads well at work to limit pressure when she is kneeling. We'll see her back in 3 weeks for reevaluation. She is still having pain in that area at that time may consider corticosteroid injection under ultrasound guidance. We also may need to consider knee x-rays in the future to assess the  degree of degenerative change. Followup 3 weeks.

## 2013-12-02 NOTE — Patient Instructions (Signed)
Thank you for coming in today  You have a condition called pes anserine bursitis  Wear knee pads at work.  Apply topical voltaren 3-4 times per day Ice for 10-15 minutes 3 times per day  Followup in 3 weeks

## 2014-01-15 ENCOUNTER — Encounter (HOSPITAL_COMMUNITY): Payer: Self-pay | Admitting: Emergency Medicine

## 2014-01-15 ENCOUNTER — Emergency Department (HOSPITAL_COMMUNITY)
Admission: EM | Admit: 2014-01-15 | Discharge: 2014-01-15 | Disposition: A | Discharge status: Nursing Home (Includes ICF) | Payer: Medicaid Other | Source: Home / Self Care

## 2014-01-15 ENCOUNTER — Emergency Department (HOSPITAL_COMMUNITY): Payer: Medicaid Other

## 2014-01-15 ENCOUNTER — Emergency Department (HOSPITAL_COMMUNITY)
Admission: EM | Admit: 2014-01-15 | Discharge: 2014-01-16 | Disposition: A | Discharge status: Home / Self Care | Payer: Medicaid Other | Attending: Emergency Medicine | Admitting: Emergency Medicine

## 2014-01-15 DIAGNOSIS — J45909 Unspecified asthma, uncomplicated: Secondary | ICD-10-CM | POA: Insufficient documentation

## 2014-01-15 DIAGNOSIS — Z8742 Personal history of other diseases of the female genital tract: Secondary | ICD-10-CM | POA: Insufficient documentation

## 2014-01-15 DIAGNOSIS — Z79899 Other long term (current) drug therapy: Secondary | ICD-10-CM | POA: Insufficient documentation

## 2014-01-15 DIAGNOSIS — Z87448 Personal history of other diseases of urinary system: Secondary | ICD-10-CM | POA: Insufficient documentation

## 2014-01-15 DIAGNOSIS — R079 Chest pain, unspecified: Secondary | ICD-10-CM

## 2014-01-15 DIAGNOSIS — E669 Obesity, unspecified: Secondary | ICD-10-CM

## 2014-01-15 DIAGNOSIS — R0789 Other chest pain: Secondary | ICD-10-CM | POA: Insufficient documentation

## 2014-01-15 DIAGNOSIS — N959 Unspecified menopausal and perimenopausal disorder: Secondary | ICD-10-CM

## 2014-01-15 DIAGNOSIS — IMO0002 Reserved for concepts with insufficient information to code with codable children: Secondary | ICD-10-CM | POA: Insufficient documentation

## 2014-01-15 DIAGNOSIS — R9431 Abnormal electrocardiogram [ECG] [EKG]: Secondary | ICD-10-CM

## 2014-01-15 LAB — BASIC METABOLIC PANEL
BUN: 14 mg/dL (ref 6–23)
CO2: 23 mEq/L (ref 19–32)
Calcium: 9 mg/dL (ref 8.4–10.5)
Chloride: 104 mEq/L (ref 96–112)
Creatinine, Ser: 0.78 mg/dL (ref 0.50–1.10)
GFR calc Af Amer: >90 mL/min (ref >90)
GFR calc non Af Amer: >90 mL/min (ref >90)
Glucose, Bld: 94 mg/dL (ref 70–99)
Potassium: 4 mEq/L (ref 3.7–5.3)
Sodium: 139 mEq/L (ref 137–147)

## 2014-01-15 LAB — CBC WITH DIFFERENTIAL/PLATELET
Basophils Absolute: 0 10*3/uL (ref 0.0–0.1)
Basophils Relative: 0 % (ref 0–1)
Eosinophils Absolute: 0.2 10*3/uL (ref 0.0–0.7)
Eosinophils Relative: 2 % (ref 0–5)
HCT: 36.4 % (ref 36.0–46.0)
Hemoglobin: 11.9 g/dL — ABNORMAL LOW (ref 12.0–15.0)
Lymphocytes Relative: 30 % (ref 12–46)
Lymphs Abs: 2.5 10*3/uL (ref 0.7–4.0)
MCH: 26.6 pg (ref 26.0–34.0)
MCHC: 32.7 g/dL (ref 30.0–36.0)
MCV: 81.3 fL (ref 78.0–100.0)
Monocytes Absolute: 0.6 10*3/uL (ref 0.1–1.0)
Monocytes Relative: 8 % (ref 3–12)
Neutro Abs: 4.9 10*3/uL (ref 1.7–7.7)
Neutrophils Relative %: 60 % (ref 43–77)
Platelets: 247 10*3/uL (ref 150–400)
RBC: 4.48 MIL/uL (ref 3.87–5.11)
RDW: 12.7 % (ref 11.5–15.5)
WBC: 8.1 10*3/uL (ref 4.0–10.5)

## 2014-01-15 LAB — URINALYSIS, ROUTINE W REFLEX MICROSCOPIC
Bilirubin Urine: NEGATIVE
Glucose, UA: NEGATIVE mg/dL
Hgb urine dipstick: NEGATIVE
Ketones, ur: NEGATIVE mg/dL
Leukocytes, UA: NEGATIVE
Nitrite: NEGATIVE
Protein, ur: NEGATIVE mg/dL
Specific Gravity, Urine: 1.029 (ref 1.005–1.030)
Urobilinogen, UA: 1 mg/dL (ref 0.0–1.0)
pH: 7.5 (ref 5.0–8.0)

## 2014-01-15 LAB — TROPONIN I
Troponin I: <0.3 ng/mL (ref <0.30)
Troponin I: <0.3 ng/mL (ref <0.30)

## 2014-01-15 MED ORDER — SODIUM CHLORIDE 0.9 % IV SOLN
Freq: Once | INTRAVENOUS | Status: AC
  Administered 2014-01-15: 18:00:00 via INTRAVENOUS

## 2014-01-15 MED ORDER — HYDROCODONE-ACETAMINOPHEN 5-325 MG PO TABS: 1.0000 | ORAL_TABLET | Freq: Four times a day (QID) | ORAL | Status: DC | PRN

## 2014-01-15 MED ORDER — IBUPROFEN 800 MG PO TABS: 800.0000 mg | ORAL_TABLET | Freq: Three times a day (TID) | ORAL | Status: DC | PRN

## 2014-01-15 NOTE — ED Notes (Signed)
Pt sent here from [facility]. Pt reports intermittent squeezing chest pains left side of chest and into left arm x3 days.. Pt states that the pain moves and has also been in right arm. Having left side mid back pains and lower abd pain, denies any n/v/d, urinary or vaginal symptoms.

## 2014-01-15 NOTE — ED Notes (Signed)
Monitor ST 103 while IV started.

## 2014-01-15 NOTE — ED Notes (Signed)
C/o  intermittant  Chest  Tightness   Which  Started  2  Days   Comes  And  Goes                   Arms  Feel       Slightly  Heavy     Sensation                 Pain  l  Side  Back              denys  Any  Nausea   denys  Any  Vomiting

## 2014-01-15 NOTE — ED Notes (Signed)
Pt states that she received 324 mg of ASA in the ambulance en route.

## 2014-01-15 NOTE — Discharge Instructions (Signed)
Return here as needed. Follow up with your doctor for a recheck. Your testing here today was normal

## 2014-01-15 NOTE — ED Notes (Signed)
[facility] called and was not available.  [facility] called and is coming.  Report called to [facility] RN -charge RN.

## 2014-01-15 NOTE — ED Notes (Signed)
Patient reports having pain in left chest, neck, and bilateral arms intermittent for 3 days.  Describes this pain as a sharpness with a squeeze/tightness.  Today patient has low abdominal pain and left low back pain in addition to chest pain.

## 2014-01-15 NOTE — ED Provider Notes (Signed)
CSN: [id number]     Arrival date & time 01/15/14  1740 History   First MD Initiated Contact with Patient 01/15/14 1927     Chief Complaint  Patient presents with  . Chest Pain  . Abdominal Pain     (Consider location/radiation/quality/duration/timing/severity/associated sxs/prior Treatment) HPI Patient presents to the emergency department with a three-day history of mid to left-sided chest pain, that  has been constant.  Patient, states, that nothing seems make her condition, better or worse.  The patient, states, that she's not had any shortness breath, weakness, dizziness, blurred vision, headache, neck pain, back pain, abdominal pain, nausea, vomiting, fever, cough or syncope.  The patient, states, that she sneezed and at that point, felt some sharp pain in her lower abdominal area.  Patient, states, that is better att this time.  The patient, states she did not take any medications prior to arrival for her symptoms.  She states that the chest pain, lasts for seconds at a time Past Medical History  Diagnosis Date  . Asthma   . OAB (overactive bladder)   . Fibroids    Past Surgical History  Procedure Laterality Date  . Cesarean section  1994, 2002, 2005,     x 3  . Abdominal hysterectomy  02/05/2006    partial   History reviewed. No pertinent family history. History  Substance Use Topics  . Smoking status: Never Smoker   . Smokeless tobacco: Not on file  . Alcohol Use: No   OB History   Grav Para Term Preterm Abortions TAB SAB Ect Mult Living   1 1 1       1      Review of Systems All other systems negative except as documented in the HPI. All pertinent positives and negatives as reviewed in the HPI.   Allergies  Review of patient's allergies indicates no known allergies.  Home Medications   Prior to Admission medications   Medication Sig Start Date End Date Taking? Authorizing Provider  fluticasone (FLONASE) 50 MCG/ACT nasal spray Place 1 spray into both nostrils  daily.   Yes Historical Provider, MD  loratadine (CLARITIN) 10 MG tablet Take 10 mg by mouth daily.   Yes Historical Provider, MD   BP 115/73  Pulse 73  Resp 19  SpO2 100% Physical Exam  Nursing note and vitals reviewed. Constitutional: She is oriented to person, place, and time. She appears well-developed and well-nourished. No distress.  HENT:  Head: Normocephalic and atraumatic.  Mouth/Throat: Oropharynx is clear and moist.  Eyes: Pupils are equal, round, and reactive to light.  Neck: Normal range of motion. Neck supple.  Cardiovascular: Normal rate, regular rhythm and normal heart sounds.   Pulmonary/Chest: Effort normal and breath sounds normal.  Neurological: She is alert and oriented to person, place, and time. She exhibits normal muscle tone. Coordination normal.  Skin: Skin is warm and dry. No erythema.    ED Course  Procedures (including critical care time) Labs Review Labs Reviewed  CBC WITH DIFFERENTIAL - Abnormal; Notable for the following:    Hemoglobin 11.9 (*)    All other components within normal limits  BASIC METABOLIC PANEL  URINALYSIS, ROUTINE W REFLEX MICROSCOPIC  TROPONIN I  TROPONIN I    Imaging Review Dg Chest 2 View  01/15/2014   CLINICAL DATA:  Chest discomfort  EXAM: CHEST  2 VIEW  COMPARISON:  CT HEART MORP W/CTA COR W/SCORE W/CA W/CM &/OR WO/CM dated 11/02/2011  FINDINGS: The heart size and mediastinal contours  are within normal limits. Both lungs are clear. The visualized skeletal structures are unremarkable.  IMPRESSION: No active cardiopulmonary disease.   Electronically Signed   By: Kathreen Devoid   On: 01/15/2014 21:06     EKG Interpretation None        The patient has an atypical story for cardiac chest pain based on the fact that the pain will last for seconds at a time. The patient states that there has been no SOB, diaphoresis, exertional SOB, or syncope. The patient has only obesity as a risk factor.  Patient is advised followup with  her primary care Dr. told to return here as needed.  Told her results, and all questions were answered  Brent General, PA-C 01/17/14 [phone]

## 2014-01-15 NOTE — ED Notes (Signed)
Report given to [facility]. 

## 2014-01-15 NOTE — ED Notes (Signed)
Pt has returned from xray

## 2014-01-15 NOTE — ED Provider Notes (Signed)
CSN: [id number]     Arrival date & time 01/15/14  1635 History   First MD Initiated Contact with Patient 01/15/14 1731     Chief Complaint  Patient presents with  . Chest Pain   (Consider location/radiation/quality/duration/timing/severity/associated sxs/prior Treatment) HPI Comments: 43 year old female presents with a three-day history of chest pain. The pain is located in the lower mid sternum radiating to the left anterior chest and left arm. She describes the pain as a squeezing pressure type pain. She has not been feeling well these past 3 days, feeling more tired and less productive in usual. Denies shortness of breath or GI symptoms. She has had a couple of episodes of nighttime diaphoresis but states it may have been due to the hot and/or cold environment. She is also complaining of pain across the lower most aspect of the abdomen when she sneezes. Her only known risk factor is obesity. She has a history of asthma.   Past Medical History  Diagnosis Date  . Asthma   . OAB (overactive bladder)   . Fibroids    Past Surgical History  Procedure Laterality Date  . Cesarean section  1994, 2002, 2005,     x 3  . Abdominal hysterectomy  02/05/2006    partial   History reviewed. No pertinent family history. History  Substance Use Topics  . Smoking status: Never Smoker   . Smokeless tobacco: Not on file  . Alcohol Use: No   OB History   Grav Para Term Preterm Abortions TAB SAB Ect Mult Living   1 1 1       1      Review of Systems  Constitutional: Positive for activity change, appetite change and fatigue. Negative for fever.  HENT: Negative.   Respiratory: Positive for chest tightness. Negative for shortness of breath.   Cardiovascular: Positive for chest pain. Negative for palpitations and leg swelling.  Gastrointestinal: Negative.   Genitourinary: Negative.   Skin: Negative for color change and rash.  Neurological: Negative.     Allergies  Review of patient's allergies  indicates no known allergies.  Home Medications   Prior to Admission medications   Medication Sig Start Date End Date Taking? Authorizing Provider  albuterol (PROVENTIL HFA;VENTOLIN HFA) 108 (90 BASE) MCG/ACT inhaler Inhale 2 puffs into the lungs every 6 (six) hours as needed. For shortness of breath    Historical Provider, MD  cyclobenzaprine (FLEXERIL) 10 MG tablet Take 1 tablet (10 mg total) by mouth at bedtime as needed for muscle spasms. 11/30/13   Gregor Hams, MD  diclofenac sodium (VOLTAREN) 1 % GEL Apply 4 g topically 4 (four) times daily. 12/02/13   Duane Boston, MD  Fluticasone-Salmeterol (ADVAIR) 250-50 MCG/DOSE AEPB Inhale 1 puff into the lungs every 12 (twelve) hours as needed (for asthma).     Historical Provider, MD  loratadine (CLARITIN) 10 MG tablet Take 10 mg by mouth daily.    Historical Provider, MD  PredniSONE 10 MG KIT 12 day dose pack po 11/30/13   Gregor Hams, MD  solifenacin (VESICARE) 10 MG tablet Take 10 mg by mouth daily.     Historical Provider, MD  traMADol (ULTRAM) 50 MG tablet Take 1 tablet (50 mg total) by mouth every 6 (six) hours as needed. 11/30/13   Gregor Hams, MD   BP 138/76  Pulse 100  Temp(Src) 98.3 F (36.8 C) (Oral)  Resp 24  SpO2 100% Physical Exam  Nursing note and vitals reviewed. Constitutional: She is  oriented to person, place, and time. She appears well-developed and well-nourished. No distress.  obese  Eyes: Conjunctivae and EOM are normal.  Neck: Normal range of motion. Neck supple.  Cardiovascular: Normal rate, regular rhythm, normal heart sounds and intact distal pulses.   No murmur heard. Pulmonary/Chest: Effort normal and breath sounds normal. No respiratory distress. She has no wheezes. She has no rales.  Abdominal: Soft. There is no tenderness.  obese  Musculoskeletal: She exhibits no edema and no tenderness.  No reproducible chest wall or upper extremity pain  Lymphadenopathy:    She has no cervical adenopathy.  Neurological: She  is alert and oriented to person, place, and time. She exhibits normal muscle tone.  Skin: Skin is warm and dry.  Psychiatric: She has a normal mood and affect.    ED Course  Procedures (including critical care time) Labs Review Labs Reviewed - No data to display  Results for orders placed in visit on 07/30/13  WET PREP BY MOLECULAR PROBE      Result Value Ref Range   Candida species NEG  Negative   Trichomonas vaginosis NEG  Negative   Gardnerella vaginalis POS (*) Negative   Imaging Review No results found. EKG: NSR. No ectopy. <90m elevation lead II and III. QS with biphasic T wave lead III. "Suttle changes."  MDM   1. Chest pain   2. Abnormal EKG   3. Obesity   4. Menopausal and perimenopausal disorder    [facility] and abnormal EKG. NonSTEMI. Pt is stable. O2, IV, monitor.    DJanne Napoleon NP 01/15/14 [phone]

## 2014-01-16 NOTE — ED Provider Notes (Signed)
Medical screening examination/treatment/procedure(s) were performed by a resident physician or non-physician practitioner and as the supervising physician I was immediately available for consultation/collaboration.  Lynne Leader, MD    Gregor Hams, MD 01/16/14 0800

## 2014-01-23 NOTE — ED Provider Notes (Signed)
Medical screening examination/treatment/procedure(s) were performed by non-physician practitioner and as supervising physician I was immediately available for consultation/collaboration.   EKG Interpretation None       Virgel Manifold, MD 01/23/14 [phone]

## 2014-02-14 ENCOUNTER — Emergency Department (HOSPITAL_COMMUNITY)
Admission: EM | Admit: 2014-02-14 | Discharge: 2014-02-14 | Disposition: A | Discharge status: Home / Self Care | Payer: Medicaid Other | Source: Home / Self Care | Attending: Emergency Medicine | Admitting: Emergency Medicine

## 2014-02-14 ENCOUNTER — Encounter (HOSPITAL_COMMUNITY): Payer: Self-pay | Admitting: Emergency Medicine

## 2014-02-14 DIAGNOSIS — S76119A Strain of unspecified quadriceps muscle, fascia and tendon, initial encounter: Secondary | ICD-10-CM | POA: Diagnosis present

## 2014-02-14 DIAGNOSIS — IMO0002 Reserved for concepts with insufficient information to code with codable children: Secondary | ICD-10-CM

## 2014-02-14 MED ORDER — DICLOFENAC SODIUM 75 MG PO TBEC: 75.0000 mg | DELAYED_RELEASE_TABLET | Freq: Two times a day (BID) | ORAL | Status: DC

## 2014-02-14 NOTE — Discharge Instructions (Signed)
Ms. Vawter,   You have strained your quadriceps tendon. You need to ice it and rest it for a few days. Also, take the voltaren for the next few days. Then go to sports medicine for follow up.   Take Care,   Dr. Maricela Bo

## 2014-02-14 NOTE — ED Provider Notes (Signed)
CSN: [id number]     Arrival date & time 02/14/14  0900 History   First MD Initiated Contact with Patient 02/14/14 [phone]     Chief Complaint  Patient presents with  . Knee Injury   (Consider location/radiation/quality/duration/timing/severity/associated sxs/prior Treatment) HPI  Knee Pain:   > Laterality: left, superior knee > Onset: suddenly last night while working at [employer] > Course: pain worsened overnight > Character: throbbing > Inciting Event: bending to pick up a box > Associated Symptoms: none > Alleviating: rest > Exacerbating: flexion of knee > Hx of injury: patellofemoral pain on right > Hx of imaging: none   Past Medical History  Diagnosis Date  . Asthma   . OAB (overactive bladder)   . Fibroids    Past Surgical History  Procedure Laterality Date  . Cesarean section  1994, 2002, 2005,     x 3  . Abdominal hysterectomy  02/05/2006    partial   History reviewed. No pertinent family history. History  Substance Use Topics  . Smoking status: Never Smoker   . Smokeless tobacco: Not on file  . Alcohol Use: No   OB History   Grav Para Term Preterm Abortions TAB SAB Ect Mult Living   1 1 1       1      Review of Systems Negative for numbness, tingling or swelling of left leg Allergies  Review of patient's allergies indicates no known allergies.  Home Medications   Prior to Admission medications   Medication Sig Start Date End Date Taking? Authorizing Provider  fluticasone (FLONASE) 50 MCG/ACT nasal spray Place 1 spray into both nostrils daily.    Historical Provider, MD  HYDROcodone-acetaminophen (NORCO/VICODIN) 5-325 MG per tablet Take 1 tablet by mouth every 6 (six) hours as needed for moderate pain. 01/15/14   Resa Miner Lawyer, PA-C  ibuprofen (ADVIL,MOTRIN) 800 MG tablet Take 1 tablet (800 mg total) by mouth every 8 (eight) hours as needed. 01/15/14   Resa Miner Lawyer, PA-C  loratadine (CLARITIN) 10 MG tablet Take 10 mg by mouth daily.     Historical Provider, MD   BP 166/69  Pulse 80  Temp(Src) 98.2 F (36.8 C) (Oral)  Resp 16  SpO2 100% Physical Exam Gen: middle age AAF, obese, pleasant and conversant Knee Exam:  Laterality: left Appearance:  Edema: no   Tenderness: yes  Superior patellar injury Range of Motion: Passive Extension: normal Flexion:normal Active Extension: limited Flexion: limited Laxity: none Maneuvers: Lachman's: negative McMurray's: negative Posterior Drawer: negative Patellar Compression: positive Strength:  Quadricep: 4/5 Hamstring: 4/5 Gait: limp     ED Course  Procedures (including critical care time) Labs Review Labs Reviewed - No data to display  Imaging Review No results found.   MDM   1. Strain of quadriceps tendon    Conservative management with ice and NSAIDS - Voltarent 75 mg BID # 30. Follow up with sports med.     Angelica Ran, MD 02/14/14 [phone]

## 2014-02-14 NOTE — ED Notes (Signed)
Pt  Reports      Yesterday  Her l  Knee  Gave  Way  And  She  Developed  Pain /  Swelling           She   Reports    Pain on  Certain movements  And  As  Well  On  Weight  Bearing

## 2014-02-16 ENCOUNTER — Encounter: Payer: Self-pay | Admitting: Family Medicine

## 2014-02-16 ENCOUNTER — Ambulatory Visit (INDEPENDENT_AMBULATORY_CARE_PROVIDER_SITE_OTHER): Payer: Medicaid Other | Admitting: Family Medicine

## 2014-02-16 VITALS — BP 140/83 | Ht 66.0 in | Wt 215.0 lb

## 2014-02-16 DIAGNOSIS — IMO0002 Reserved for concepts with insufficient information to code with codable children: Secondary | ICD-10-CM

## 2014-02-16 DIAGNOSIS — S76119A Strain of unspecified quadriceps muscle, fascia and tendon, initial encounter: Secondary | ICD-10-CM

## 2014-02-16 NOTE — Patient Instructions (Addendum)
You have a strain of the quadricep tendon in her left knee area. I would recommend no prolonged standing or lifting more than 10 pounds for the next week or so. Ice the area twice a day. Wear a compression bandage as tolerated during the day. Elevate the extremity when you can. Return to clinic in about 2 weeks. . 20 times a day what she should do full extension and full flexion of the leg. Followup in one week.

## 2014-02-16 NOTE — ED Provider Notes (Signed)
Medical screening examination/treatment/procedure(s) were performed by a resident physician and as supervising physician I was immediately available for consultation/collaboration.  Marco Raper, M.D.  Virna Livengood C Derald Lorge, MD 02/16/14 0753 

## 2014-02-17 NOTE — Progress Notes (Addendum)
Patient ID: Joyce Welch, female   DOB: May 24, 1971, 43 y.o.   MRN: [id number] . Joyce Welch - 43 y.o. female MRN [id number]  Date of birth: Jan 18, 1971    SUBJECTIVE:     Date of injury: Feb 14, 2014. Injury to left knee. 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 As she picked up a rather heavy bag of fertilizer in turn to place it on the shelf, she felt a pop and odd sensation in her left knee. She was immediately having pain with weightbearing. She tried to continue working and the knee swelled up and felt unsteady. She was seen at urgent care where they diagnosed her with her quadricep tendon strain. Since then, she's put some ice on it and tried to elevate it. She has a lot of stiffness and swelling. When she stands on it feels like there's a lot of instability and that's going to give way.  ROS:     No unusual weight change, no foot numbness, no fever, sweats, chills.   PERTINENT  PMH / PSH FH / / SH:  Past Medical, Surgical, Social, and Family History Reviewed & Updated per EMR.  Pertinent Historical Findings include:  No personal history diabetes mellitus. No prior history of left knee injury or surgery. She has had some problems in the past with her right knee.  OBJECTIVE: BP 140/83  Ht 5\' 6"  (1.676 m)  Wt 215 lb (97.523 kg)  BMI 34.72 kg/m2  Physical Exam:  Vital signs are reviewed. GENERAL: Well-developed overweight female no acute distress KNEES: Left knee has small effusion. There is no warmth or erythema. She has full extension but pain with the last 20 of extension. Full flexion but some stiffness with the last 10-15 of flexion. Ligamentously intact to varus and valgus stress. Some pain with patellar mobilization. Tender to palpation along the attachment of the quadriceps tendon to the lateral proximal patella area the popliteal space is mildly tender and slightly full. Lachman's is normal. McMurray could not be performed adequately secondary to  patient discomfort. Calf is soft. Distally she is neurovascularly intact. ULTRASOUND: Mild amount of fluid in the suprapatellar pouch and in the popliteal space but no clear-cut Baker cyst. There is an area on the lateral portion of the quadricep tendon (at the attachment to the proximal patellar pole) that   reveals a longitudinal split about a centimeter and a half in length. This corresponds with her area of tenderness. There is increased Doppler activity seen over this area. The rest of the knee exam including the patellar tendon is normal.  ASSESSMENT & PLAN:   QUADRICEPS TENDON STRAIN: Small tear in quadriceps tendon at its insertion  into lateral retinaculum / proximal patellar pole. Partial tear distal quadricep tendon on lateral side.  Work modification. I don't think she should be doing a lot of standing or lifting for the next week. I will have her ice twice a day. She's instructed on flexion extension exercises to do intermittently throughout the day. Return to clinic one week.

## 2014-02-17 NOTE — Assessment & Plan Note (Signed)
Partial tear distal quadricep tendon a lateral side. Work modification. I don't think she should be doing a lot of standing or lifting for the next week. I will have her ice twice a day. She's instructed on flexion extension exercises to do intermittently throughout the day. Return to clinic one week.

## 2014-02-25 ENCOUNTER — Ambulatory Visit: Payer: Medicaid Other | Admitting: Emergency Medicine

## 2014-03-09 ENCOUNTER — Encounter: Payer: Self-pay | Admitting: Family Medicine

## 2014-03-09 ENCOUNTER — Ambulatory Visit (INDEPENDENT_AMBULATORY_CARE_PROVIDER_SITE_OTHER): Payer: Medicaid Other | Admitting: Family Medicine

## 2014-03-09 VITALS — BP 128/84 | Ht 66.0 in | Wt 230.0 lb

## 2014-03-09 DIAGNOSIS — IMO0002 Reserved for concepts with insufficient information to code with codable children: Secondary | ICD-10-CM

## 2014-03-09 DIAGNOSIS — S76119A Strain of unspecified quadriceps muscle, fascia and tendon, initial encounter: Secondary | ICD-10-CM

## 2014-03-10 ENCOUNTER — Other Ambulatory Visit: Payer: Self-pay

## 2014-03-10 DIAGNOSIS — Z1231 Encounter for screening mammogram for malignant neoplasm of breast: Secondary | ICD-10-CM

## 2014-03-10 NOTE — Progress Notes (Signed)
Patient ID: Joyce Welch, female   DOB: 1971/05/27, 43 y.o.   MRN: [id number]  Joyce Welch - 43 y.o. female MRN [id number]  Date of birth: 1971-04-10    SUBJECTIVE:     Date of injury Feb 14, 2014. 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 When she picked up the fertilizer bag and turn to put on the shelf, she felt immediate pain in her left knee. I had seen her on May 18 and an ultrasound which showed a small quadricep tear. Since then she has been evaluated by their workers comp physician. She is back today with me for followup because she was notified by [employer] that her workers comp station claim was denied.  She has improved in her Leg and knee pain and welling but it is still not back to baseline. After about 2 hours of standing she has significant increase in pain and some knee swelling. She's not been working. She is very confused about what's going on with the job but ultimately says she wants to take care of her health has not was anything to jeopardize her knee. ROS:     Leg pain and swelling as per history of present illness. She's had no numbness in her left lower extremity. No fever, sweats, chills.  PERTINENT  PMH / PSH FH / / SH:  Past Medical, Surgical, Social, and Family History Reviewed & Updated per EMR.  Pertinent Historical Findings include:  No prior history of left knee injury her left knee surgery.  OBJECTIVE: BP 128/84  Ht 5\' 6"  (1.676 m)  Wt 230 lb (104.327 kg)  BMI 37.14 kg/m2  Physical Exam:  Vital signs are reviewed. GENERAL: Well-developed overweight female no acute distress KNEE: Left. Small amount of effusion is still present. She has full extension and flexion. Her strength in knee extension is about 30-50% of that of the right, exam is somewhat hampered by pain with active range of motion/strength testing. Distally she is neurovascularly intact. The calf is soft. The quadricep is still mildly tender  to palpation. There are no ecchymoses. The popliteal space is benign.  ASSESSMENT & PLAN:  See problem based charting & AVS for pt instructions.

## 2014-03-10 NOTE — Assessment & Plan Note (Signed)
Quadricep muscle/tendon tear, still not back to baseline. A bit puzzling to me why they are telling her this is not workers comp injury as it seems quite consistent with the job she was doing as she describes it to me. I do not think she is ready return to work. I will have her see physical therapy to see if we can get a formal evaluation of comparative strength, left versus right, to help her make her decisions.  Ido not think she should return to full duty until the left quadricep is 85-90% strength of the right quadricep.  Will discuss results with her after we get physical therapy evaluation. Until then I would continue only intermittent standing, no lifting greater than 10 pounds, no standing more than a total of 2-3 hours a day. Ice daily or twice a day. Brace or compression bandage as tolerated. Weightbearing as tolerated..  Additionally: The workers comp physician had called mania with a question or 2 about my note. I had amended that as there was a typographical error and send her the note. She wonder whether or not we should get an MRI. Given the expected full recovery based on the clinical exam and was on ultrasound, I told her we probably did not need the MRI. I still feel the MRIs not necessary for evaluation, diagnosis or treatment. I do wonder if workers comp would have been happier had we had the expensive test to "prove" her injury. Unclear to me. I did discuss this with her at length today. I'll see her back in 2 weeks.

## 2014-03-12 ENCOUNTER — Ambulatory Visit: Payer: Self-pay

## 2014-03-17 ENCOUNTER — Ambulatory Visit
Admission: RE | Admit: 2014-03-17 | Discharge: 2014-03-17 | Disposition: A | Discharge status: Home / Self Care | Payer: Medicaid Other | Source: Ambulatory Visit

## 2014-03-17 DIAGNOSIS — Z1231 Encounter for screening mammogram for malignant neoplasm of breast: Secondary | ICD-10-CM

## 2014-03-24 ENCOUNTER — Ambulatory Visit: Payer: Medicaid Other | Attending: Family Medicine | Admitting: Physical Therapy

## 2014-03-24 DIAGNOSIS — M25569 Pain in unspecified knee: Secondary | ICD-10-CM | POA: Insufficient documentation

## 2014-03-24 DIAGNOSIS — IMO0001 Reserved for inherently not codable concepts without codable children: Secondary | ICD-10-CM | POA: Insufficient documentation

## 2014-03-24 DIAGNOSIS — R5381 Other malaise: Secondary | ICD-10-CM | POA: Insufficient documentation

## 2014-03-27 ENCOUNTER — Ambulatory Visit (INDEPENDENT_AMBULATORY_CARE_PROVIDER_SITE_OTHER): Payer: Medicaid Other | Admitting: Family Medicine

## 2014-03-27 ENCOUNTER — Encounter: Payer: Self-pay | Admitting: Family Medicine

## 2014-03-27 VITALS — BP 142/86 | Ht 66.0 in | Wt 227.0 lb

## 2014-03-27 DIAGNOSIS — Z5189 Encounter for other specified aftercare: Secondary | ICD-10-CM

## 2014-03-27 DIAGNOSIS — S76112D Strain of left quadriceps muscle, fascia and tendon, subsequent encounter: Secondary | ICD-10-CM

## 2014-03-27 DIAGNOSIS — IMO0002 Reserved for concepts with insufficient information to code with codable children: Secondary | ICD-10-CM

## 2014-03-27 NOTE — Progress Notes (Signed)
   Subjective:    Patient ID: Joyce Welch, female    DOB: [birth year], 43 y.o.   MRN: [id number]  HPI Followup left quadriceps strain. She was evaluated by physical therapy in they have sent there results today. She feels like she's pretty much back to normal but still a little weak. She wants to return to work wants to make sure she's not doing anything to further arm or leg. Says a little swelling of she's on her feet all day. She's been where an Ace bandage on the knee in that helps with the swelling some.   Review of Systems No giving way of the leg, no falls.    Objective:   Physical Exam  Vital signs are reviewed GENERAL: Well-developed overweight female no acute distress KNEES: Bilaterally symmetrical without effusion. QUADRICEP are symmetrical and bulk and tone. The left quad is about 85-90% strength of the right in knee extension. ULTRASOUND: Quadricep muscle is intact without any sign of tear. There is no increased Doppler activity. Previous area of deficit is resolved. He quadricep tendon is intact. Patellar tendon is intact. There is no knee effusion. There is a very small amount of fluid in the suprapatellar pouch.      Assessment & Plan:

## 2014-03-27 NOTE — Assessment & Plan Note (Signed)
I would recommend continuing her exercise regimen at home, particularly the short arc quadriceps lists. I would recommend she do this for the next month to ensure that she gets to strength back. I think she's otherwise read return to work without restriction and it filled out paperwork, copy in the chart

## 2014-05-13 ENCOUNTER — Emergency Department (HOSPITAL_COMMUNITY)
Admission: EM | Admit: 2014-05-13 | Discharge: 2014-05-13 | Disposition: A | Discharge status: Home / Self Care | Payer: Medicaid Other | Attending: Emergency Medicine | Admitting: Emergency Medicine

## 2014-05-13 ENCOUNTER — Encounter (HOSPITAL_COMMUNITY): Payer: Self-pay | Admitting: Emergency Medicine

## 2014-05-13 ENCOUNTER — Emergency Department (HOSPITAL_COMMUNITY): Payer: Medicaid Other

## 2014-05-13 DIAGNOSIS — N318 Other neuromuscular dysfunction of bladder: Secondary | ICD-10-CM | POA: Diagnosis not present

## 2014-05-13 DIAGNOSIS — B9789 Other viral agents as the cause of diseases classified elsewhere: Secondary | ICD-10-CM | POA: Insufficient documentation

## 2014-05-13 DIAGNOSIS — B349 Viral infection, unspecified: Secondary | ICD-10-CM

## 2014-05-13 DIAGNOSIS — J45909 Unspecified asthma, uncomplicated: Secondary | ICD-10-CM | POA: Diagnosis not present

## 2014-05-13 DIAGNOSIS — R05 Cough: Secondary | ICD-10-CM | POA: Diagnosis present

## 2014-05-13 DIAGNOSIS — R059 Cough, unspecified: Secondary | ICD-10-CM | POA: Insufficient documentation

## 2014-05-13 MED ORDER — HYDROCODONE-HOMATROPINE 5-1.5 MG/5ML PO SYRP: 5.0000 mL | ORAL_SOLUTION | Freq: Four times a day (QID) | ORAL | Status: DC | PRN

## 2014-05-13 NOTE — ED Notes (Signed)
Anda Kraft, PA-C, at the bedside.

## 2014-05-13 NOTE — Discharge Instructions (Signed)
Take hycodan as needed for cough. Refer to attached documents for more information. Follow up with your doctor as needed.

## 2014-05-13 NOTE — ED Notes (Signed)
Pt reports having asthma and increase in cough, no relief with her inhaler at home. Airway intact at triage, spo2 100%.

## 2014-05-13 NOTE — ED Provider Notes (Signed)
CSN: [id number]     Arrival date & time 05/13/14  21 History   First MD Initiated Contact with Patient 05/13/14 1827   This chart was scribed for non-physician practitioner Phineas Inches, PA-C, working with Dot Lanes, MD by Rosary Lively, ED scribe. This patient was seen in room TR06C/TR06C and the patient's care was started at 7:30 PM.    Chief Complaint  Patient presents with  . Asthma  . Cough     The history is provided by the patient. No language interpreter was used.  HPI Comments:  Joyce Welch is a 43 y.o. female with h/o asthma who presents to the Emergency Department complaining of worsening cough. Pt states that she has also experienced associated symptoms of fever, chills, SOB, and night sweats. Pt has used inhaler, with no relief. Pt states that she visited PCP, and was treated with nebulizer, antibiotic, pretnozone and cough medicine with no relief. Pt reports that she has also been coughing to the point were she gags. Pt states that her cough is now non- productive. Pt denies fever.  Past Medical History  Diagnosis Date  . Asthma   . OAB (overactive bladder)   . Fibroids    Past Surgical History  Procedure Laterality Date  . Cesarean section  1994, 2002, 2005,     x 3  . Abdominal hysterectomy  02/05/2006    partial   History reviewed. No pertinent family history. History  Substance Use Topics  . Smoking status: Never Smoker   . Smokeless tobacco: Not on file  . Alcohol Use: No   OB History   Grav Para Term Preterm Abortions TAB SAB Ect Mult Living   1 1 1       1      Review of Systems  HENT: Positive for congestion.   Respiratory: Positive for cough. Negative for wheezing.   Cardiovascular: Positive for chest pain.  All other systems reviewed and are negative.     Allergies  Review of patient's allergies indicates no known allergies.  Home Medications   Prior to Admission medications   Medication Sig Start Date End Date Taking? Authorizing  Provider  fluticasone (FLONASE) 50 MCG/ACT nasal spray Place 1 spray into both nostrils daily.   Yes Historical Provider, MD  ibuprofen (ADVIL,MOTRIN) 800 MG tablet Take 800 mg by mouth every 8 (eight) hours as needed for moderate pain. 01/15/14  Yes Resa Miner Lawyer, PA-C  loratadine (CLARITIN) 10 MG tablet Take 10 mg by mouth daily.   Yes Historical Provider, MD   BP 149/78  Pulse 84  Temp(Src) 98 F (36.7 C) (Oral)  Resp 18  SpO2 100% Physical Exam  Nursing note and vitals reviewed. Constitutional: She is oriented to person, place, and time. She appears well-developed and well-nourished. No distress.  HENT:  Head: Normocephalic and atraumatic.  Right Ear: External ear normal.  Left Ear: External ear normal.  Mouth/Throat: Oropharynx is clear and moist. No oropharyngeal exudate.  Eyes: Conjunctivae and EOM are normal. Pupils are equal, round, and reactive to light.  Neck: Normal range of motion. Neck supple.  Cardiovascular: Normal rate, regular rhythm and normal heart sounds.  Exam reveals no gallop and no friction rub.   No murmur heard. Pulmonary/Chest: Effort normal. She has wheezes. She has no rales. She exhibits no tenderness.  Mild inspiratory wheeze noted in RLL.   Abdominal: Soft. She exhibits no distension. There is no tenderness. There is no rebound.  Musculoskeletal: Normal range of motion.  Neurological: She is alert and oriented to person, place, and time.  Speech is goal-oriented. Moves limbs without ataxia.   Skin: Skin is warm and dry.  Psychiatric: She has a normal mood and affect. Her behavior is normal.    ED Course  Procedures  DIAGNOSTIC STUDIES: Oxygen Saturation is 100% on RA, normal by my interpretation.  COORDINATION OF CARE: 7:35 PM-Discussed treatment plan which includes CXR and antibiotics with pt at bedside and pt agreed to plan.  Labs Review Labs Reviewed - No data to display  Imaging Review Dg Chest 2 View  05/13/2014   CLINICAL DATA:   Chest pain, shortness of breath and cough. History of asthma.  EXAM: CHEST  2 VIEW  COMPARISON:  Chest radiograph performed 01/15/2014  FINDINGS: The lungs are well-aerated and clear. There is no evidence of focal opacification, pleural effusion or pneumothorax.  The heart is normal in size; the mediastinal contour is within normal limits. No acute osseous abnormalities are seen.  IMPRESSION: No acute cardiopulmonary process seen.   Electronically Signed   By: Garald Balding M.D.   On: 05/13/2014 21:14     EKG Interpretation None      MDM   Final diagnoses:  Viral illness    Patient's chest xray unremarkable for acute changes. Patient likely has a viral illness. Vitals stable and patient afebrile. Patient will be discharged with hycodan.   I personally performed the services described in this documentation, which was scribed in my presence. The recorded information has been reviewed and is accurate.      Alvina Chou, PA-C 05/13/14 2329

## 2014-05-18 NOTE — ED Provider Notes (Signed)
Medical screening examination/treatment/procedure(s) were performed by non-physician practitioner and as supervising physician I was immediately available for consultation/collaboration.    Dot Lanes, MD 05/18/14 [phone]

## 2014-06-17 ENCOUNTER — Other Ambulatory Visit (HOSPITAL_COMMUNITY)
Admission: RE | Admit: 2014-06-17 | Discharge: 2014-06-17 | Disposition: A | Discharge status: Home / Self Care | Payer: Medicaid Other | Source: Ambulatory Visit | Attending: Family Medicine | Admitting: Family Medicine

## 2014-06-17 ENCOUNTER — Emergency Department (HOSPITAL_COMMUNITY)
Admission: EM | Admit: 2014-06-17 | Discharge: 2014-06-17 | Disposition: A | Discharge status: Home / Self Care | Payer: Medicaid Other | Source: Home / Self Care | Attending: Family Medicine | Admitting: Family Medicine

## 2014-06-17 ENCOUNTER — Encounter (HOSPITAL_COMMUNITY): Payer: Self-pay | Admitting: Emergency Medicine

## 2014-06-17 DIAGNOSIS — Z113 Encounter for screening for infections with a predominantly sexual mode of transmission: Secondary | ICD-10-CM | POA: Diagnosis not present

## 2014-06-17 DIAGNOSIS — N760 Acute vaginitis: Secondary | ICD-10-CM | POA: Diagnosis present

## 2014-06-17 LAB — POCT URINALYSIS DIP (DEVICE)
Bilirubin Urine: NEGATIVE
Glucose, UA: NEGATIVE mg/dL
Ketones, ur: NEGATIVE mg/dL
Leukocytes, UA: NEGATIVE
Nitrite: NEGATIVE
Protein, ur: NEGATIVE mg/dL
Specific Gravity, Urine: 1.015 (ref 1.005–1.030)
Urobilinogen, UA: 0.2 mg/dL (ref 0.0–1.0)
pH: 7 (ref 5.0–8.0)

## 2014-06-17 MED ORDER — METRONIDAZOLE 500 MG PO TABS: 500.0000 mg | ORAL_TABLET | Freq: Two times a day (BID) | ORAL | Status: DC

## 2014-06-17 NOTE — ED Provider Notes (Signed)
Joyce Welch is a 43 y.o. female who presents to Urgent Care today for pelvic pain and discharge associated with mild left low back pain. Symptoms of the present for a few days. No fevers or chills nausea vomiting or diarrhea. Patient recently was given antibiotics and prednisone and fluconazole.   Past Medical History  Diagnosis Date  . Asthma   . OAB (overactive bladder)   . Fibroids    History  Substance Use Topics  . Smoking status: Never Smoker   . Smokeless tobacco: Not on file  . Alcohol Use: No   ROS as above Medications: No current facility-administered medications for this encounter.   Current Outpatient Prescriptions  Medication Sig Dispense Refill  . fluticasone (FLONASE) 50 MCG/ACT nasal spray Place 1 spray into both nostrils daily.      Marland Kitchen HYDROcodone-homatropine (HYCODAN) 5-1.5 MG/5ML syrup Take 5 mLs by mouth every 6 (six) hours as needed.  120 mL  0  . ibuprofen (ADVIL,MOTRIN) 800 MG tablet Take 800 mg by mouth every 8 (eight) hours as needed for moderate pain.      Marland Kitchen loratadine (CLARITIN) 10 MG tablet Take 10 mg by mouth daily.      . metroNIDAZOLE (FLAGYL) 500 MG tablet Take 1 tablet (500 mg total) by mouth 2 (two) times daily.  14 tablet  0    Exam:  BP 129/91  Pulse 74  Temp(Src) 98.6 F (37 C) (Oral)  Resp 16  SpO2 100% Gen: Well NAD HEENT: EOMI,  MMM Lungs: Normal work of breathing. CTABL Heart: RRR no MRG Abd: NABS, Soft. Nondistended, Nontender no CV angle tenderness to percussion Exts: Brisk capillary refill, warm and well perfused.  GYN: Normal external genitalia. Vaginal canal with thin white discharge. Normal-appearing cervix. Nontender  No results found for this or any previous visit (from the past 24 hour(s)). No results found.  Assessment and Plan: 43 y.o. female with vaginitis.  Cytology pending. Treatment with metronidazole.  Discussed warning signs or symptoms. Please see discharge instructions. Patient expresses  understanding.   This note was created using Systems analyst. Any transcription errors are unintended.    Gregor Hams, MD 06/17/14 [phone]

## 2014-06-17 NOTE — Discharge Instructions (Signed)
Thank you for coming in today. Take metronidazole twice daily for one week. Do not take with alcohol. If your belly pain worsens, or you have high fever, bad vomiting, blood in your stool or black tarry stool go to the Emergency Room.    Bacterial Vaginosis Bacterial vaginosis is a vaginal infection that occurs when the normal balance of bacteria in the vagina is disrupted. It results from an overgrowth of certain bacteria. This is the most common vaginal infection in women of childbearing age. Treatment is important to prevent complications, especially in pregnant women, as it can cause a premature delivery. CAUSES  Bacterial vaginosis is caused by an increase in harmful bacteria that are normally present in smaller amounts in the vagina. Several different kinds of bacteria can cause bacterial vaginosis. However, the reason that the condition develops is not fully understood. RISK FACTORS Certain activities or behaviors can put you at an increased risk of developing bacterial vaginosis, including:  Having a new sex partner or multiple sex partners.  Douching.  Using an intrauterine device (IUD) for contraception. Women do not get bacterial vaginosis from toilet seats, bedding, swimming pools, or contact with objects around them. SIGNS AND SYMPTOMS  Some women with bacterial vaginosis have no signs or symptoms. Common symptoms include:  Grey vaginal discharge.  A fishlike odor with discharge, especially after sexual intercourse.  Itching or burning of the vagina and vulva.  Burning or pain with urination. DIAGNOSIS  Your health care provider will take a medical history and examine the vagina for signs of bacterial vaginosis. A sample of vaginal fluid may be taken. Your health care provider will look at this sample under a microscope to check for bacteria and abnormal cells. A vaginal pH test may also be done.  TREATMENT  Bacterial vaginosis may be treated with antibiotic medicines.  These may be given in the form of a pill or a vaginal cream. A second round of antibiotics may be prescribed if the condition comes back after treatment.  HOME CARE INSTRUCTIONS   Only take over-the-counter or prescription medicines as directed by your health care provider.  If antibiotic medicine was prescribed, take it as directed. Make sure you finish it even if you start to feel better.  Do not have sex until treatment is completed.  Tell all sexual partners that you have a vaginal infection. They should see their health care provider and be treated if they have problems, such as a mild rash or itching.  Practice safe sex by using condoms and only having one sex partner. SEEK MEDICAL CARE IF:   Your symptoms are not improving after 3 days of treatment.  You have increased discharge or pain.  You have a fever. MAKE SURE YOU:   Understand these instructions.  Will watch your condition.  Will get help right away if you are not doing well or get worse. FOR MORE INFORMATION  Centers for Disease Control and Prevention, Division of STD Prevention: AppraiserFraud.fi American Sexual Health Association (ASHA): www.ashastd.org  Document Released: 09/18/2005 Document Revised: 07/09/2013 Document Reviewed: 04/30/2013 [facility] Patient Information 2015 [place], [place]. This information is not intended to replace advice given to you by your health care provider. Make sure you discuss any questions you have with your health care provider.

## 2014-06-17 NOTE — ED Notes (Signed)
C/o lower back pain and pelvic pain onset 2 days Reports PCP gave her prednisone and antibiotics for URI Also reports vag d/c onset 2 days Denies f/v/n, chills, urinary sx Alert, no signs of acute distress.

## 2014-06-17 NOTE — ED Notes (Signed)
Waiting on urine sample from pt.

## 2014-08-01 ENCOUNTER — Emergency Department (INDEPENDENT_AMBULATORY_CARE_PROVIDER_SITE_OTHER)
Admission: EM | Admit: 2014-08-01 | Discharge: 2014-08-01 | Disposition: A | Discharge status: Home / Self Care | Payer: Self-pay | Source: Home / Self Care | Attending: Emergency Medicine | Admitting: Emergency Medicine

## 2014-08-01 ENCOUNTER — Other Ambulatory Visit (HOSPITAL_COMMUNITY)
Admission: RE | Admit: 2014-08-01 | Discharge: 2014-08-01 | Disposition: A | Discharge status: Home / Self Care | Payer: Self-pay | Source: Ambulatory Visit | Attending: Emergency Medicine | Admitting: Emergency Medicine

## 2014-08-01 ENCOUNTER — Encounter (HOSPITAL_COMMUNITY): Payer: Self-pay | Admitting: Emergency Medicine

## 2014-08-01 DIAGNOSIS — N760 Acute vaginitis: Secondary | ICD-10-CM | POA: Insufficient documentation

## 2014-08-01 DIAGNOSIS — N9489 Other specified conditions associated with female genital organs and menstrual cycle: Secondary | ICD-10-CM

## 2014-08-01 DIAGNOSIS — Z113 Encounter for screening for infections with a predominantly sexual mode of transmission: Secondary | ICD-10-CM | POA: Insufficient documentation

## 2014-08-01 DIAGNOSIS — M545 Low back pain, unspecified: Secondary | ICD-10-CM

## 2014-08-01 DIAGNOSIS — R102 Pelvic and perineal pain: Secondary | ICD-10-CM

## 2014-08-01 LAB — POCT URINALYSIS DIP (DEVICE)
Bilirubin Urine: NEGATIVE
Glucose, UA: NEGATIVE mg/dL
Ketones, ur: NEGATIVE mg/dL
Nitrite: NEGATIVE
Protein, ur: NEGATIVE mg/dL
Specific Gravity, Urine: 1.025 (ref 1.005–1.030)
Urobilinogen, UA: 0.2 mg/dL (ref 0.0–1.0)
pH: 5.5 (ref 5.0–8.0)

## 2014-08-01 MED ORDER — MELOXICAM 7.5 MG PO TABS
7.5000 mg | ORAL_TABLET | Freq: Every day | ORAL | Status: DC | PRN
Start: 2014-08-01 — End: 2015-06-21

## 2014-08-01 MED ORDER — SENNOSIDES-DOCUSATE SODIUM 8.6-50 MG PO TABS: 2.0000 | ORAL_TABLET | Freq: Every day | ORAL | Status: DC

## 2014-08-01 MED ORDER — CEPHALEXIN 500 MG PO CAPS: 500.0000 mg | ORAL_CAPSULE | Freq: Four times a day (QID) | ORAL | Status: DC

## 2014-08-01 MED ORDER — CYCLOBENZAPRINE HCL 10 MG PO TABS: 10.0000 mg | ORAL_TABLET | Freq: Three times a day (TID) | ORAL | Status: DC | PRN

## 2014-08-01 NOTE — ED Provider Notes (Addendum)
CSN: [id number]     Arrival date & time 08/01/14  0938 History   First MD Initiated Contact with Patient 08/01/14 0932     Chief Complaint  Patient presents with  . Abdominal Pain   (Consider location/radiation/quality/duration/timing/severity/associated sxs/prior Treatment) HPI She is a 43 year old woman here for evaluation of low back pain and pelvic pressure. She was seen here in September with similar symptoms and diagnosed with bacterial vaginosis. About 2-3 days ago, she noted recurrence of her low back pain. It is described as aching and stiffness. It is worse with rotational movement. It is better with a heating pad. She also reports pelvic pressure, "like my insides are coming out," when she tries to use the bathroom. She denies any vaginal discharge. No dysuria or hematuria. She has 3 children, but they were all delivered by C-section. She has had a partial hysterectomy. She denies any history of constipation, last bowel movement was yesterday and it was normal.  Past Medical History  Diagnosis Date  . Asthma   . OAB (overactive bladder)   . Fibroids    Past Surgical History  Procedure Laterality Date  . Cesarean section  1994, 2002, 2005,     x 3  . Abdominal hysterectomy  02/05/2006    partial   History reviewed. No pertinent family history. History  Substance Use Topics  . Smoking status: Never Smoker   . Smokeless tobacco: Not on file  . Alcohol Use: No   OB History   Grav Para Term Preterm Abortions TAB SAB Ect Mult Living   1 1 1       1      Review of Systems  Constitutional: Negative for fever and chills.  Genitourinary: Positive for pelvic pain (pressure). Negative for dysuria, hematuria and vaginal discharge.  Musculoskeletal: Positive for back pain.    Allergies  Review of patient's allergies indicates no known allergies.  Home Medications   Prior to Admission medications   Medication Sig Start Date End Date Taking? Authorizing Provider  fluticasone  (FLONASE) 50 MCG/ACT nasal spray Place 1 spray into both nostrils daily.    Historical Provider, MD  HYDROcodone-homatropine (HYCODAN) 5-1.5 MG/5ML syrup Take 5 mLs by mouth every 6 (six) hours as needed. 05/13/14   Kaitlyn Szekalski, PA-C  ibuprofen (ADVIL,MOTRIN) 800 MG tablet Take 800 mg by mouth every 8 (eight) hours as needed for moderate pain. 01/15/14   Resa Miner Lawyer, PA-C  loratadine (CLARITIN) 10 MG tablet Take 10 mg by mouth daily.    Historical Provider, MD  metroNIDAZOLE (FLAGYL) 500 MG tablet Take 1 tablet (500 mg total) by mouth 2 (two) times daily. 06/17/14   Gregor Hams, MD   BP 129/81  Pulse 86  Temp(Src) 98.3 F (36.8 C) (Oral)  Resp 18  SpO2 100% Physical Exam  Constitutional: She is oriented to person, place, and time. She appears well-developed and well-nourished. No distress.  Cardiovascular: Normal rate.   Pulmonary/Chest: Effort normal.  Abdominal: Soft. Bowel sounds are normal. She exhibits no distension. There is no tenderness. There is no rebound and no guarding.  Genitourinary: There is no rash on the right labia. There is no rash on the left labia. No tenderness around the vagina. No vaginal discharge found.  Stool in rectal vault.  No prolapse appreciated.  Musculoskeletal:  Back: no vertebral tenderness or stepoffs.  Mild tenderness over SI joint areas.  Neurological: She is alert and oriented to person, place, and time.    ED Course  Procedures (including critical care time) Labs Review Labs Reviewed  POCT URINALYSIS DIP (DEVICE) - Abnormal; Notable for the following:    Hgb urine dipstick SMALL (*)    Leukocytes, UA TRACE (*)    All other components within normal limits  URINE CULTURE  CERVICOVAGINAL ANCILLARY ONLY    Imaging Review No results found.   MDM  No diagnosis found. I suspect her back pain is musculoskeletal. We'll treat with meloxicam and Flexeril. Recommend alternating heat and ice to the area.  Her urine does appear to  be infected with leukocytes and hemoglobin. We'll treat with Keflex for 3 days.  Swabs collected for wet prep and gonorrhea/chlamydia. Will call if any results are positive. We'll also give a stool softener as she did have stool in the rectal vault. If stools collecting there, this may be responsible her pressure-like sensation.  Follow-up with PCP as needed.    Melony Overly, MD 08/01/14 Hildebran Faiga Stones, MD 08/01/14 [phone]

## 2014-08-01 NOTE — Discharge Instructions (Signed)
Your back pain is likely coming from stiff muscles. Take meloxicam 1 pill daily for 1 week, then as needed. Do not take this medication with ibuprofen, Aleve, Advil. Take Flexeril every 8 hours as needed for muscle tightness or spasm. This medication will make you sleepy, do not take it while driving.  The pelvic pressure may be due to some mild constipation. Take Senokot, 2 pills at bedtime for the next month.  Your urine does look like it is infected. Take keflex 1 pill 4 times a day for 3 days.  Follow-up with your regular doctor in 2-4 weeks for follow-up.

## 2014-08-01 NOTE — ED Notes (Signed)
Pt unable to give sample;  Pt given cup of water

## 2014-08-01 NOTE — ED Notes (Signed)
Seen mid September for lower abdominal and low back pain, given dx of BV, used Rx as directed, now has recurrent syx. Spouse here in [facility] for check "to be sure they are not swapping this back and forth"

## 2014-08-02 LAB — URINE CULTURE: Colony Count: 70000

## 2014-08-03 ENCOUNTER — Encounter (HOSPITAL_COMMUNITY): Payer: Self-pay | Admitting: Emergency Medicine

## 2014-08-03 LAB — CERVICOVAGINAL ANCILLARY ONLY
Chlamydia: NEGATIVE
Neisseria Gonorrhea: NEGATIVE

## 2014-08-04 LAB — CERVICOVAGINAL ANCILLARY ONLY
Wet Prep (BD Affirm): NEGATIVE
Wet Prep (BD Affirm): POSITIVE — AB
Wet Prep (BD Affirm): POSITIVE — AB

## 2014-08-05 ENCOUNTER — Telehealth (HOSPITAL_COMMUNITY): Payer: Self-pay | Admitting: *Deleted

## 2014-08-05 MED ORDER — METRONIDAZOLE 500 MG PO TABS: 500.0000 mg | ORAL_TABLET | Freq: Two times a day (BID) | ORAL | Status: DC

## 2014-08-05 MED ORDER — FLUCONAZOLE 150 MG PO TABS: 150.0000 mg | ORAL_TABLET | Freq: Every day | ORAL | Status: DC

## 2014-08-05 NOTE — ED Notes (Signed)
GC/Chlamdydia neg., Affirm: Candida and Gardnerella pos., Trich neg., Urine culture: 70,000 colonies mult. bacterial types none predominant.  Message to Dr. Bridgett Larsson. She e-prescribed Flagyl and Diflucan to pt.'s pharmacy.  I called pt. Pt. verified x 2 and given results.  Pt. said she finished all of the Keflex. Pt. Told she needs Diflucan for the yeast infections and Flagyl for bacterial vaginosis.   Pt. instructed to no alcohol while taking this medication.  Pt. Told where to pick up her Rx.'s.  Pt. voiced understanding. [name] 08/05/2014

## 2014-09-11 ENCOUNTER — Emergency Department (HOSPITAL_COMMUNITY)
Admission: EM | Admit: 2014-09-11 | Discharge: 2014-09-11 | Disposition: A | Discharge status: Home / Self Care | Payer: Self-pay | Attending: Emergency Medicine | Admitting: Emergency Medicine

## 2014-09-11 ENCOUNTER — Emergency Department (INDEPENDENT_AMBULATORY_CARE_PROVIDER_SITE_OTHER)
Admission: EM | Admit: 2014-09-11 | Discharge: 2014-09-11 | Disposition: A | Discharge status: Nursing Home (Includes ICF) | Payer: Self-pay | Source: Home / Self Care | Attending: Emergency Medicine | Admitting: Emergency Medicine

## 2014-09-11 ENCOUNTER — Encounter (HOSPITAL_COMMUNITY): Payer: Self-pay | Admitting: Emergency Medicine

## 2014-09-11 ENCOUNTER — Emergency Department (HOSPITAL_COMMUNITY): Payer: Self-pay

## 2014-09-11 ENCOUNTER — Encounter (HOSPITAL_COMMUNITY): Payer: Self-pay | Admitting: *Deleted

## 2014-09-11 DIAGNOSIS — I209 Angina pectoris, unspecified: Secondary | ICD-10-CM

## 2014-09-11 DIAGNOSIS — Z791 Long term (current) use of non-steroidal anti-inflammatories (NSAID): Secondary | ICD-10-CM | POA: Insufficient documentation

## 2014-09-11 DIAGNOSIS — E663 Overweight: Secondary | ICD-10-CM | POA: Insufficient documentation

## 2014-09-11 DIAGNOSIS — R079 Chest pain, unspecified: Secondary | ICD-10-CM | POA: Insufficient documentation

## 2014-09-11 DIAGNOSIS — J45909 Unspecified asthma, uncomplicated: Secondary | ICD-10-CM | POA: Insufficient documentation

## 2014-09-11 DIAGNOSIS — Z87448 Personal history of other diseases of urinary system: Secondary | ICD-10-CM | POA: Insufficient documentation

## 2014-09-11 DIAGNOSIS — Z8542 Personal history of malignant neoplasm of other parts of uterus: Secondary | ICD-10-CM | POA: Insufficient documentation

## 2014-09-11 DIAGNOSIS — Z79899 Other long term (current) drug therapy: Secondary | ICD-10-CM | POA: Insufficient documentation

## 2014-09-11 LAB — BASIC METABOLIC PANEL
Anion gap: 13 (ref 5–15)
BUN: 13 mg/dL (ref 6–23)
CO2: 23 mEq/L (ref 19–32)
Calcium: 9 mg/dL (ref 8.4–10.5)
Chloride: 105 mEq/L (ref 96–112)
Creatinine, Ser: 0.85 mg/dL (ref 0.50–1.10)
GFR calc Af Amer: >90 mL/min (ref >90)
GFR calc non Af Amer: 82 mL/min — ABNORMAL LOW (ref >90)
Glucose, Bld: 86 mg/dL (ref 70–99)
Potassium: 3.9 mEq/L (ref 3.7–5.3)
Sodium: 141 mEq/L (ref 137–147)

## 2014-09-11 LAB — CBC
HCT: 34.7 % — ABNORMAL LOW (ref 36.0–46.0)
Hemoglobin: 11 g/dL — ABNORMAL LOW (ref 12.0–15.0)
MCH: 25.3 pg — ABNORMAL LOW (ref 26.0–34.0)
MCHC: 31.7 g/dL (ref 30.0–36.0)
MCV: 80 fL (ref 78.0–100.0)
Platelets: 260 10*3/uL (ref 150–400)
RBC: 4.34 MIL/uL (ref 3.87–5.11)
RDW: 12.7 % (ref 11.5–15.5)
WBC: 7.1 10*3/uL (ref 4.0–10.5)

## 2014-09-11 LAB — TROPONIN I: Troponin I: <0.3 ng/mL (ref <0.30)

## 2014-09-11 MED ORDER — ASPIRIN 81 MG PO CHEW
324.0000 mg | CHEWABLE_TABLET | Freq: Once | ORAL | Status: AC
  Administered 2014-09-11: 324 mg via ORAL

## 2014-09-11 MED ORDER — NITROGLYCERIN 0.4 MG SL SUBL
0.4000 mg | SUBLINGUAL_TABLET | SUBLINGUAL | Status: AC | PRN
  Administered 2014-09-11: 0.4 mg via SUBLINGUAL

## 2014-09-11 MED ORDER — NITROGLYCERIN 0.4 MG SL SUBL
SUBLINGUAL_TABLET | SUBLINGUAL | Status: AC
  Filled 2014-09-11: qty 1

## 2014-09-11 MED ORDER — SODIUM CHLORIDE 0.9 % IV SOLN
INTRAVENOUS | Status: DC
  Administered 2014-09-11: 20:00:00 via INTRAVENOUS

## 2014-09-11 MED ORDER — ASPIRIN 81 MG PO CHEW
CHEWABLE_TABLET | ORAL | Status: AC
Start: 2014-09-11 — End: 2014-09-11
  Filled 2014-09-11: qty 4

## 2014-09-11 NOTE — ED Notes (Addendum)
Patient returned from X-ray 

## 2014-09-11 NOTE — ED Provider Notes (Signed)
CSN: [id number]     Arrival date & time 09/11/14  2013 History   First MD Initiated Contact with Patient 09/11/14 2018     Chief Complaint  Patient presents with  . Chest Pain     (Consider location/radiation/quality/duration/timing/severity/associated sxs/prior Treatment) HPI Comments: 43 year old female with a past medical history of asthma presenting via EMS from urgent care for evaluation of chest pain that has been ongoing for 2 days. Patient reports gradual onset midsternal chest pain beginning around 3:00 AM yesterday radiating to her right arm and the right side of her neck, lasting a few minutes and subsiding on its own. Pain has been intermittent since, coming and going at random, lasting a few seconds to a few minutes. Nothing in specific makes her pain come or go. Initially she had mild shortness of breath, used her albuterol inhaler with relief. She took 2 aspirin when the pain began. She presented to urgent care this evening, because earlier today will she was at work she started to experience lightheadedness, dizziness, slight blurred vision along with a sensation that she is going to pass out. She did not have any syncopal episode. This sensation lasted a minutes until she sat down and had something to eat, however shortly after getting back up to work again, symptoms returned. She then went home from work and her symptoms resolved. Currently she is asymptomatic. She is a nonsmoker. No family history of early heart disease. No personal history of heart disease. Denies nausea, vomiting or diaphoresis.  The history is provided by the patient and medical records.    Past Medical History  Diagnosis Date  . Asthma   . OAB (overactive bladder)   . Fibroids    Past Surgical History  Procedure Laterality Date  . Cesarean section  1994, 2002, 2005,     x 3  . Abdominal hysterectomy  02/05/2006    partial   History reviewed. No pertinent family history. History  Substance Use  Topics  . Smoking status: Never Smoker   . Smokeless tobacco: Never Used  . Alcohol Use: No   OB History    Gravida Para Term Preterm AB TAB SAB Ectopic Multiple Living   1 1 1       1      Review of Systems 10 Systems reviewed and are negative for acute change except as noted in the HPI.   Allergies  Review of patient's allergies indicates no known allergies.  Home Medications   Prior to Admission medications   Medication Sig Start Date End Date Taking? Authorizing Provider  albuterol (PROVENTIL HFA;VENTOLIN HFA) 108 (90 BASE) MCG/ACT inhaler Inhale 1 puff into the lungs every 6 (six) hours as needed for wheezing or shortness of breath.   Yes Historical Provider, MD  loratadine (CLARITIN) 10 MG tablet Take 10 mg by mouth daily.   Yes Historical Provider, MD  cephALEXin (KEFLEX) 500 MG capsule Take 1 capsule (500 mg total) by mouth 4 (four) times daily. Patient not taking: Reported on 09/11/2014 08/01/14   Melony Overly, MD  cyclobenzaprine (FLEXERIL) 10 MG tablet Take 1 tablet (10 mg total) by mouth 3 (three) times daily as needed for muscle spasms. 08/01/14   Melony Overly, MD  fluconazole (DIFLUCAN) 150 MG tablet Take 1 tablet (150 mg total) by mouth daily. Patient not taking: Reported on 09/11/2014 08/05/14   Melony Overly, MD  fluticasone [facility]) 50 MCG/ACT nasal spray Place 1 spray into both nostrils daily as needed for allergies.  Historical Provider, MD  HYDROcodone-homatropine (HYCODAN) 5-1.5 MG/5ML syrup Take 5 mLs by mouth every 6 (six) hours as needed. Patient taking differently: Take 5 mLs by mouth every 6 (six) hours as needed for cough.  05/13/14   Kaitlyn Szekalski, PA-C  ibuprofen (ADVIL,MOTRIN) 800 MG tablet Take 800 mg by mouth every 8 (eight) hours as needed for moderate pain. 01/15/14   Resa Miner Lawyer, PA-C  meloxicam (MOBIC) 7.5 MG tablet Take 1 tablet (7.5 mg total) by mouth daily as needed for pain. 08/01/14   Melony Overly, MD  metroNIDAZOLE (FLAGYL) 500  MG tablet Take 1 tablet (500 mg total) by mouth 2 (two) times daily. Patient not taking: Reported on 09/11/2014 08/05/14   Melony Overly, MD  senna-docusate (SENOKOT-S) 8.6-50 MG per tablet Take 2 tablets by mouth at bedtime. Patient not taking: Reported on 09/11/2014 08/01/14   Melony Overly, MD   BP 148/76 mmHg  Pulse 62  Temp(Src) 98.1 F (36.7 C) (Oral)  Resp 15  Ht 5\' 6"  (1.676 m)  Wt 220 lb (99.791 kg)  BMI 35.53 kg/m2  SpO2 99% Physical Exam  Constitutional: She is oriented to person, place, and time. She appears well-developed and well-nourished. No distress.  Overweight.  HENT:  Head: Normocephalic and atraumatic.  Mouth/Throat: Oropharynx is clear and moist.  Eyes: Conjunctivae and EOM are normal. Pupils are equal, round, and reactive to light.  Neck: Normal range of motion. Neck supple. No JVD present.  Cardiovascular: Normal rate, regular rhythm, normal heart sounds and intact distal pulses.   No extremity edema.  Pulmonary/Chest: Effort normal and breath sounds normal. No respiratory distress.  Abdominal: Soft. Bowel sounds are normal. There is no tenderness.  Musculoskeletal: Normal range of motion. She exhibits no edema.  Neurological: She is alert and oriented to person, place, and time. She has normal strength. No sensory deficit.  Speech fluent, goal oriented. Moves limbs without ataxia. Equal grip strength bilateral.  Skin: Skin is warm and dry. She is not diaphoretic.  Psychiatric: She has a normal mood and affect. Her behavior is normal.  Nursing note and vitals reviewed.   ED Course  Procedures (including critical care time) Labs Review Labs Reviewed  CBC - Abnormal; Notable for the following:    Hemoglobin 11.0 (*)    HCT 34.7 (*)    MCH 25.3 (*)    All other components within normal limits  BASIC METABOLIC PANEL - Abnormal; Notable for the following:    GFR calc non Af Amer 82 (*)    All other components within normal limits  TROPONIN I     Imaging Review Dg Chest 2 View  09/11/2014   CLINICAL DATA:  Right-sided chest pain radiating down the right arm today. Weakness.  EXAM: CHEST  2 VIEW  COMPARISON:  05/13/2014  FINDINGS: The heart size and mediastinal contours are within normal limits. Both lungs are clear. The visualized skeletal structures are unremarkable.  IMPRESSION: No active cardiopulmonary disease.   Electronically Signed   By: Lucienne Capers M.D.   On: 09/11/2014 21:39     EKG Interpretation   Date/Time:  Friday September 11 2014 20:25:06 EST Ventricular Rate:  66 PR Interval:  172 QRS Duration: 80 QT Interval:  393 QTC Calculation: 412 R Axis:   6 Text Interpretation:  Sinus arrhythmia Probable left atrial enlargement  Similar to prior Confirmed by Mingo Amber  MD, [place] ([id number]) on 09/11/2014  8:27:39 PM      MDM   Final  diagnoses:  Chest pain   Patient presenting from urgent care with chest pain. She is well appearing and in no apparent distress. Vital signs stable. No pain throughout her emergency department encounter. EKG without any acute findings. Workup negative. Doubt acute cardiac issue, heart score 2. PERC negative. Doubt PE. Advised her to follow-up with her PCP. She is stable for discharge. Patient comfortable being discharged home. Return precautions given. Patient states understanding of treatment care plan and is agreeable.  Carman Ching, PA-C 09/11/14 2244  Evelina Bucy, MD 09/12/14 [phone]

## 2014-09-11 NOTE — ED Notes (Signed)
Pt. Refused wheelchair and left with all belongings 

## 2014-09-11 NOTE — ED Provider Notes (Signed)
Chief Complaint   Chest Pain  History of Present Illness    Joyce Welch is a 43 year old female with no risk factors who has had a history since 3 AM yesterday morning of substernal chest pain with radiation to the right neck and down the right arm. She denies any radiation to the back. The pain is intermittent lasting seconds to minutes at a time and rated a 5/10 in intensity. The pain woke her up in the middle the night out of a sleep. This recurred multiple times throughout the day yesterday and today. Today she felt dizzy and lightheaded and felt like she was about to pass out. Her vision was blurring. She denies any shortness of breath, diaphoresis, or nausea. She has no history of heart disease, hypertension, diabetes, elevated cholesterol, cigarette smoking, or family history of heart disease. She had something like this before in 2003 and was observed in the emergency department.  Review of Systems    Other than noted above, the patient denies any of the following symptoms. Systemic:  No fever or chills. Pulmonary:  No cough, wheezing, shortness of breath, sputum production, hemoptysis. Cardiac:  No palpitations, rapid heartbeat, dizziness, presyncope or syncope. GI:  No abdominal pain, heartburn, nausea, or vomiting. Ext:  No leg pain or swelling.  [place]    Past medical history, family history, social history, meds, and allergies were reviewed. Current meds are ibuprofen, Claritin, albuterol. She has asthma and allergies. She had a hysterectomy in 2007 because of fibroid tumors.  Physical Exam     Vital signs:  BP 146/102 mmHg  Pulse 72  Temp(Src) 97.6 F (36.4 C) (Oral)  Resp 18  SpO2 99% Gen:  Alert, oriented, in no distress, skin warm and dry. ENT:  Mucous membranes moist, pharynx clear. Neck:  Supple, no adenopathy or tenderness.  No JVD. Lungs:  Clear to auscultation, no wheezes, rales or rhonchi.  No respiratory distress. Heart:  Regular rhythm.  No gallops, murmers,  clicks or rubs. Chest:  No chest wall tenderness. Abdomen:  Soft, nontender, no organomegaly or mass.  Bowel sounds normal.  No pulsatile abdominal mass or bruit. Ext:  No edema.  No calf tenderness and Homann's sign negative.  Pulses full and equal. Skin:  Warm and dry.  No rash.   EKG Results:  Date: 09/11/2014  Rate: 77  Rhythm: normal sinus rhythm  QRS Axis: normal--23  Intervals: normal--QTc interval 436 ms  ST/T Wave abnormalities: normal  Conduction Disutrbances:none  Narrative Interpretation: Normal sinus rhythm, possible left atrial enlargement, no ischemic changes.  Old EKG Reviewed: none available   Course in Urgent [place]   The following medications were given:  Medications  nitroGLYCERIN (NITROSTAT) SL tablet 0.4 mg   aspirin chewable tablet 324 mg   0.9 %  sodium chloride infusion  Assessment     The encounter diagnosis was Angina pectoris.  Plan     The patient was transferred to the ED via EMS in stable condition.  Medical Decision Making:  43 year old female with no risk factors has a history since 3 a.m. Yesterday of intermittent substernal chest pain, radiating to right neck and down right arm.  Pain rated 5/10 and lasts for seconds to minutes.  Associated with dizziness and near syncope.  No shortness of breath, nausea, or diaphoresis.  No cardiac history.  EKG was normal.  Will start NS and give ASA and TNG and transfer via EMS.       Harden Mo, MD 09/11/14 [phone]

## 2014-09-11 NOTE — ED Notes (Signed)
Pt states that she had had chest pain with pain radiating to the right arm for 2 days. Pt states that while she was at work she got real dizzy. Pt is in no acute distress at this time

## 2014-09-11 NOTE — Discharge Instructions (Signed)
We have determined that your problem requires further evaluation in the emergency department.  We will take care of your transport there.  Once at the emergency department, you will be evaluated by a provider and they will order whatever treatment or tests they deem necessary.  We cannot guarantee that they will do any specific test or do any specific treatment.  ° °

## 2014-09-11 NOTE — ED Notes (Signed)
Pt. Has had substernal chest pain for 3 days with no radiation. 5/10 pain. Pt. States that the pain comes and goes.

## 2014-09-11 NOTE — ED Notes (Signed)
Pt will be transferred over to the [facility] via EMS. Spoke with charge Delia Chimes gave history and chief complaint

## 2014-09-11 NOTE — Discharge Instructions (Signed)

## 2015-06-21 ENCOUNTER — Encounter (HOSPITAL_COMMUNITY): Payer: Self-pay | Admitting: Emergency Medicine

## 2015-06-21 ENCOUNTER — Emergency Department (HOSPITAL_COMMUNITY): Payer: Medicaid Other

## 2015-06-21 ENCOUNTER — Emergency Department (HOSPITAL_COMMUNITY)
Admission: EM | Admit: 2015-06-21 | Discharge: 2015-06-22 | Disposition: A | Discharge status: Home / Self Care | Payer: Medicaid Other | Attending: Emergency Medicine | Admitting: Emergency Medicine

## 2015-06-21 DIAGNOSIS — J45909 Unspecified asthma, uncomplicated: Secondary | ICD-10-CM | POA: Insufficient documentation

## 2015-06-21 DIAGNOSIS — Z86018 Personal history of other benign neoplasm: Secondary | ICD-10-CM | POA: Insufficient documentation

## 2015-06-21 DIAGNOSIS — R55 Syncope and collapse: Secondary | ICD-10-CM | POA: Insufficient documentation

## 2015-06-21 DIAGNOSIS — R51 Headache: Secondary | ICD-10-CM | POA: Insufficient documentation

## 2015-06-21 DIAGNOSIS — Z79899 Other long term (current) drug therapy: Secondary | ICD-10-CM | POA: Insufficient documentation

## 2015-06-21 DIAGNOSIS — R0789 Other chest pain: Secondary | ICD-10-CM | POA: Insufficient documentation

## 2015-06-21 LAB — CBC
HCT: 38.3 % (ref 36.0–46.0)
Hemoglobin: 12.3 g/dL (ref 12.0–15.0)
MCH: 25.5 pg — ABNORMAL LOW (ref 26.0–34.0)
MCHC: 32.1 g/dL (ref 30.0–36.0)
MCV: 79.3 fL (ref 78.0–100.0)
Platelets: 288 10*3/uL (ref 150–400)
RBC: 4.83 MIL/uL (ref 3.87–5.11)
RDW: 12.8 % (ref 11.5–15.5)
WBC: 7.9 10*3/uL (ref 4.0–10.5)

## 2015-06-21 LAB — BASIC METABOLIC PANEL
Anion gap: 9 (ref 5–15)
BUN: 10 mg/dL (ref 6–20)
CO2: 26 mmol/L (ref 22–32)
Calcium: 9.2 mg/dL (ref 8.9–10.3)
Chloride: 103 mmol/L (ref 101–111)
Creatinine, Ser: 0.95 mg/dL (ref 0.44–1.00)
GFR calc Af Amer: >60 mL/min (ref >60)
GFR calc non Af Amer: >60 mL/min (ref >60)
Glucose, Bld: 94 mg/dL (ref 65–99)
Potassium: 4.1 mmol/L (ref 3.5–5.1)
Sodium: 138 mmol/L (ref 135–145)

## 2015-06-21 LAB — I-STAT TROPONIN, ED
Troponin i, poc: 0 ng/mL (ref 0.00–0.08)
Troponin i, poc: 0 ng/mL (ref 0.00–0.08)

## 2015-06-21 MED ORDER — IBUPROFEN 400 MG PO TABS
400.0000 mg | ORAL_TABLET | Freq: Once | ORAL | Status: AC
  Administered 2015-06-21: 400 mg via ORAL
  Filled 2015-06-21: qty 1

## 2015-06-21 NOTE — ED Provider Notes (Signed)
CSN: [id number]   Arrival date & time 06/21/15 2000  History  This chart was scribed for Joyce Hacker, MD by Altamease Oiler, ED Scribe. This patient was seen in room D34C/D34C and the patient's care was started at 11:19 PM.  Chief Complaint  Patient presents with  . Chest Pain    The patient started having chest pain since 1800hrs today.  She dropped her sister off and had someone to bring her to the ED.      HPI The history is provided by the patient. No language interpreter was used.   Joyce Welch is a 44 y.o. female with PMHx of asthma who presents to the Emergency Department complaining of an episode of central chest pain with sudden onset today around 7:30 PM while driving. The episode started with dizziness ("like I was going to black out") and then she notes chest pain with inspiration. Associated symptoms include numbness in the hands.   She has had new daily headaches for the last week that she has been treating with ibuprofen. Pt denies fever, cough, LE pain or swelling. She drove 10 hours from [place] on 06/19/15. Pt denies birth control use. No history of HTN or cardiac disease.   Past Medical History  Diagnosis Date  . Asthma   . OAB (overactive bladder)   . Fibroids     Past Surgical History  Procedure Laterality Date  . Cesarean section  1994, 2002, 2005,     x 3  . Abdominal hysterectomy  02/05/2006    partial    History reviewed. No pertinent family history.  Social History  Substance Use Topics  . Smoking status: Never Smoker   . Smokeless tobacco: Never Used  . Alcohol Use: No     Review of Systems  Constitutional: Negative for fever.  Eyes: Negative for visual disturbance.  Respiratory: Negative for cough, chest tightness and shortness of breath.   Cardiovascular: Positive for chest pain. Negative for palpitations and leg swelling.  Gastrointestinal: Negative for nausea, vomiting and abdominal pain.  Genitourinary: Negative for dysuria.  Neurological:  Positive for headaches.       Near syncope  All other systems reviewed and are negative.  Home Medications   Prior to Admission medications   Medication Sig Start Date End Date Taking? Authorizing Provider  albuterol (PROVENTIL HFA;VENTOLIN HFA) 108 (90 BASE) MCG/ACT inhaler Inhale 1 puff into the lungs daily.    Yes Historical Provider, MD  albuterol (PROVENTIL) (2.5 MG/3ML) 0.083% nebulizer solution Take 2.5 mg by nebulization every 6 (six) hours as needed for wheezing or shortness of breath.   Yes Historical Provider, MD  ibuprofen (ADVIL,MOTRIN) 800 MG tablet Take 800 mg by mouth every 8 (eight) hours as needed for moderate pain. 01/15/14  Yes Dalia Heading, PA-C    Allergies  Review of patient's allergies indicates no known allergies.  Triage Vitals: BP 148/84 mmHg  Pulse 75  Temp(Src) 97.9 F (36.6 C) (Oral)  Resp 16  Ht 5\' 6"  (1.676 m)  Wt 210 lb (95.255 kg)  BMI 33.91 kg/m2  SpO2 100%  Physical Exam  Constitutional: She is oriented to person, place, and time. She appears well-developed and well-nourished.  Obese  HENT:  Head: Normocephalic and atraumatic.  Eyes: Pupils are equal, round, and reactive to light.  Cardiovascular: Normal rate, regular rhythm and normal heart sounds.   No murmur heard. Pulmonary/Chest: Effort normal and breath sounds normal. No respiratory distress. She has no wheezes. She exhibits no tenderness.  Abdominal: Soft. Bowel sounds are normal. There is no tenderness. There is no rebound.  Musculoskeletal: She exhibits no edema.  Neurological: She is alert and oriented to person, place, and time.  Skin: Skin is warm and dry.  Psychiatric: She has a normal mood and affect.  Nursing note and vitals reviewed.   ED Course  Procedures   DIAGNOSTIC STUDIES: Oxygen Saturation is 100% on RA, normal by my interpretation.    COORDINATION OF CARE: 11:23 PM Discussed treatment plan which includes CXR, EKG, and labs with pt at bedside and pt  agreed to plan.  12:52 AM I re-evaluated the patient and provided an update on the results of her lab work and CXR. I informed her of the plan for discharge with cardiology f/u.   Labs Reviewed  CBC - Abnormal; Notable for the following:    MCH 25.5 (*)    All other components within normal limits  BASIC METABOLIC PANEL  D-DIMER, QUANTITATIVE (NOT AT [facility])  Randolm Idol, ED  Randolm Idol, ED    Imaging Review Dg Chest 2 View  06/21/2015   CLINICAL DATA:  Chest pain beginning this evening.  EXAM: CHEST  2 VIEW  COMPARISON:  09/11/2014  FINDINGS: Lungs are adequately inflated without consolidation or effusion. Cardiomediastinal silhouette, bones and soft tissues are within normal.  IMPRESSION: No active cardiopulmonary disease.   Electronically Signed   By: Marin Olp M.D.   On: 06/21/2015 21:24    EKG Interpretation  Date/Time:  Monday June 21 2015 20:05:41 EDT Ventricular Rate:  76 PR Interval:  164 QRS Duration: 78 QT Interval:  378 QTC Calculation: 425 R Axis:   24 Text Interpretation:  Normal sinus rhythm Normal ECG Confirmed by HORTON  MD, COURTNEY ([id number]) on 06/21/2015 11:10:48 PM    MDM   Final diagnoses:  Other chest pain  Near syncope    Patient presents with chest pain and lightheadedness. Nontoxic on exam.  Vital signs are reassuring. Relatively low risk for heart disease. Risk factor of obesity. EKG reassuring. Chest x-ray shows no evidence of pneumonia or pneumothorax. D-dimer is negative and low suspicion for PE. No evidence of arrhythmia. Patient is not orthostatic. She is currently symptom-free. Discussed workup with the patient. Advised her to follow-up with her primary physician and cardiology for definitive cardiac evaluation. She was given strict return precautions.  After history, exam, and medical workup I feel the patient has been appropriately medically screened and is safe for discharge home. Pertinent diagnoses were discussed with the  patient. Patient was given return precautions.  I personally performed the services described in this documentation, which was scribed in my presence. The recorded information has been reviewed and is accurate.    Joyce Hacker, MD 06/22/15 [phone]

## 2015-06-21 NOTE — ED Notes (Signed)
The patient started having chest pain since 1800hrs today.  She dropped her sister off and had someone to bring her to the ED.  The patient rates her pain 7/10.  She has not taken anything for the chest pain.  She is lightheaded but denies any other symptoms.

## 2015-06-21 NOTE — ED Notes (Signed)
Dr. Horton at the bedside.  

## 2015-06-22 LAB — D-DIMER, QUANTITATIVE (NOT AT ARMC): D-Dimer, Quant: <0.27 ug/mL-FEU (ref 0.00–0.48)

## 2015-06-22 NOTE — Discharge Instructions (Signed)
You were seen today for chest pain and near syncope. Workup is reassuring. There is no evidence of heart attack and your screening for blood clots is negative. The rest of your workup is reassuring. You need to follow-up with your primary physician and cardiology for further evaluation. If he has any new or recurrent symptoms you should be reevaluated immediately.  Chest Pain (Nonspecific) It is often hard to give a specific diagnosis for the cause of chest pain. There is always a chance that your pain could be related to something serious, such as a heart attack or a blood clot in the lungs. You need to follow up with your health care provider for further evaluation. CAUSES   Heartburn.  Pneumonia or bronchitis.  Anxiety or stress.  Inflammation around your heart (pericarditis) or lung (pleuritis or pleurisy).  A blood clot in the lung.  A collapsed lung (pneumothorax). It can develop suddenly on its own (spontaneous pneumothorax) or from trauma to the chest.  Shingles infection (herpes zoster virus). The chest wall is composed of bones, muscles, and cartilage. Any of these can be the source of the pain.  The bones can be bruised by injury.  The muscles or cartilage can be strained by coughing or overwork.  The cartilage can be affected by inflammation and become sore (costochondritis). DIAGNOSIS  Lab tests or other studies may be needed to find the cause of your pain. Your health care provider may have you take a test called an ambulatory electrocardiogram (ECG). An ECG records your heartbeat patterns over a 24-hour period. You may also have other tests, such as:  Transthoracic echocardiogram (TTE). During echocardiography, sound waves are used to evaluate how blood flows through your heart.  Transesophageal echocardiogram (TEE).  Cardiac monitoring. This allows your health care provider to monitor your heart rate and rhythm in real time.  Holter monitor. This is a portable  device that records your heartbeat and can help diagnose heart arrhythmias. It allows your health care provider to track your heart activity for several days, if needed.  Stress tests by exercise or by giving medicine that makes the heart beat faster. TREATMENT   Treatment depends on what may be causing your chest pain. Treatment may include:  Acid blockers for heartburn.  Anti-inflammatory medicine.  Pain medicine for inflammatory conditions.  Antibiotics if an infection is present.  You may be advised to change lifestyle habits. This includes stopping smoking and avoiding alcohol, caffeine, and chocolate.  You may be advised to keep your head raised (elevated) when sleeping. This reduces the chance of acid going backward from your stomach into your esophagus. Most of the time, nonspecific chest pain will improve within 2-3 days with rest and mild pain medicine.  HOME CARE INSTRUCTIONS   If antibiotics were prescribed, take them as directed. Finish them even if you start to feel better.  For the next few days, avoid physical activities that bring on chest pain. Continue physical activities as directed.  Do not use any tobacco products, including cigarettes, chewing tobacco, or electronic cigarettes.  Avoid drinking alcohol.  Only take medicine as directed by your health care provider.  Follow your health care provider's suggestions for further testing if your chest pain does not go away.  Keep any follow-up appointments you made. If you do not go to an appointment, you could develop lasting (chronic) problems with pain. If there is any problem keeping an appointment, call to reschedule. SEEK MEDICAL CARE IF:   Your chest  pain does not go away, even after treatment.  You have a rash with blisters on your chest.  You have a fever. SEEK IMMEDIATE MEDICAL CARE IF:   You have increased chest pain or pain that spreads to your arm, neck, jaw, back, or abdomen.  You have  shortness of breath.  You have an increasing cough, or you cough up blood.  You have severe back or abdominal pain.  You feel nauseous or vomit.  You have severe weakness.  You faint.  You have chills. This is an emergency. Do not wait to see if the pain will go away. Get medical help at once. Call your local emergency services (911 in [place] Do not drive yourself to the hospital. MAKE SURE YOU:   Understand these instructions.  Will watch your condition.  Will get help right away if you are not doing well or get worse. Document Released: 06/28/2005 Document Revised: 09/23/2013 Document Reviewed: 04/23/2008 [facility] Patient Information 2015 [place], [place]. This information is not intended to replace advice given to you by your health care provider. Make sure you discuss any questions you have with your health care provider.

## 2015-09-08 ENCOUNTER — Other Ambulatory Visit: Payer: Self-pay

## 2015-09-08 DIAGNOSIS — Z1231 Encounter for screening mammogram for malignant neoplasm of breast: Secondary | ICD-10-CM

## 2015-09-09 ENCOUNTER — Ambulatory Visit: Payer: Self-pay

## 2015-09-28 ENCOUNTER — Encounter (HOSPITAL_COMMUNITY): Payer: Self-pay | Admitting: Emergency Medicine

## 2015-09-28 ENCOUNTER — Emergency Department (HOSPITAL_COMMUNITY)
Admission: EM | Admit: 2015-09-28 | Discharge: 2015-09-28 | Disposition: A | Discharge status: Home / Self Care | Payer: Medicaid Other | Attending: Emergency Medicine | Admitting: Emergency Medicine

## 2015-09-28 DIAGNOSIS — Z79899 Other long term (current) drug therapy: Secondary | ICD-10-CM | POA: Diagnosis not present

## 2015-09-28 DIAGNOSIS — Z87440 Personal history of urinary (tract) infections: Secondary | ICD-10-CM | POA: Diagnosis not present

## 2015-09-28 DIAGNOSIS — M545 Low back pain: Secondary | ICD-10-CM | POA: Diagnosis present

## 2015-09-28 DIAGNOSIS — M5442 Lumbago with sciatica, left side: Secondary | ICD-10-CM | POA: Diagnosis not present

## 2015-09-28 DIAGNOSIS — Z87448 Personal history of other diseases of urinary system: Secondary | ICD-10-CM | POA: Insufficient documentation

## 2015-09-28 DIAGNOSIS — Z86018 Personal history of other benign neoplasm: Secondary | ICD-10-CM | POA: Insufficient documentation

## 2015-09-28 DIAGNOSIS — J45909 Unspecified asthma, uncomplicated: Secondary | ICD-10-CM | POA: Insufficient documentation

## 2015-09-28 LAB — URINALYSIS, ROUTINE W REFLEX MICROSCOPIC
Bilirubin Urine: NEGATIVE
Glucose, UA: NEGATIVE mg/dL
Ketones, ur: NEGATIVE mg/dL
Leukocytes, UA: NEGATIVE
Nitrite: NEGATIVE
Protein, ur: NEGATIVE mg/dL
Specific Gravity, Urine: 1.018 (ref 1.005–1.030)
pH: 6 (ref 5.0–8.0)

## 2015-09-28 LAB — URINE MICROSCOPIC-ADD ON

## 2015-09-28 MED ORDER — NAPROXEN 500 MG PO TABS: 500.0000 mg | ORAL_TABLET | Freq: Two times a day (BID) | ORAL | Status: DC

## 2015-09-28 MED ORDER — CYCLOBENZAPRINE HCL 10 MG PO TABS: 10.0000 mg | ORAL_TABLET | Freq: Two times a day (BID) | ORAL | Status: DC | PRN

## 2015-09-28 NOTE — ED Notes (Signed)
Reports 2 days of low back pain, left worse than right, denies N/V/D/F or abd pain, A/O x4, ambulatory and in NAD

## 2015-09-28 NOTE — ED Notes (Signed)
See PA assessment 

## 2015-09-28 NOTE — ED Provider Notes (Signed)
CSN: [id number]     Arrival date & time 09/28/15  [id number] History  By signing my name below, I, Stephania Fragmin, attest that this documentation has been prepared under the direction and in the presence of Samantha Dowless, PA-C. Electronically Signed: Stephania Fragmin, ED Scribe. 09/28/2015. 12:31 PM.    Chief Complaint  Patient presents with  . Urinary Tract Infection   The history is provided by the patient. No language interpreter was used.    HPI Comments: Joyce Welch is a 44 y.o. female with a history of overactive bladder and fibroids, who presents to the Emergency Department complaining of constant, worsening, sharp bilateral low back pain, left greater than right, that began 5 days ago while sitting. She denies trauma, injury, or heavy lifting. She also complains of associated tingling in her right leg. Lying on her left side exacerbates her pain. She reports a history of UTI's but states this does not feel like one. She reports she had applied IcyHot cream to her back, which provided mild, transient relief. She did not try anything else for her symptoms. She denies a history of IVDA or CA. She denies dysuria, fever, or bowel or bladder incontinence.    Past Medical History  Diagnosis Date  . Asthma   . OAB (overactive bladder)   . Fibroids    Past Surgical History  Procedure Laterality Date  . Cesarean section  1994, 2002, 2005,     x 3  . Abdominal hysterectomy  02/05/2006    partial   No family history on file. Social History  Substance Use Topics  . Smoking status: Never Smoker   . Smokeless tobacco: Never Used  . Alcohol Use: No   OB History    Gravida Para Term Preterm AB TAB SAB Ectopic Multiple Living   1 1 1       1      Review of Systems  Constitutional: Negative for fever.  Genitourinary: Negative for dysuria.  Musculoskeletal: Positive for back pain.    Allergies  Review of patient's allergies indicates no known allergies.  Home Medications   Prior to Admission  medications   Medication Sig Start Date End Date Taking? Authorizing Provider  albuterol (PROVENTIL HFA;VENTOLIN HFA) 108 (90 BASE) MCG/ACT inhaler Inhale 1 puff into the lungs daily.     Historical Provider, MD  albuterol (PROVENTIL) (2.5 MG/3ML) 0.083% nebulizer solution Take 2.5 mg by nebulization every 6 (six) hours as needed for wheezing or shortness of breath.    Historical Provider, MD  ibuprofen (ADVIL,MOTRIN) 800 MG tablet Take 800 mg by mouth every 8 (eight) hours as needed for moderate pain. 01/15/14   Christopher Lawyer, PA-C   BP 120/90 mmHg  Pulse 81  Temp(Src) 98 F (36.7 C) (Oral)  Resp 18  Ht 5\' 5"  (1.651 m)  Wt 240 lb (108.863 kg)  BMI 39.94 kg/m2  SpO2 99% Physical Exam  Constitutional: She is oriented to person, place, and time. She appears well-developed and well-nourished. No distress.  HENT:  Head: Normocephalic and atraumatic.  Eyes: Conjunctivae are normal. Right eye exhibits no discharge. Left eye exhibits no discharge. No scleral icterus.  Neck: Normal range of motion. Neck supple.  Cardiovascular: Normal rate.   Pulmonary/Chest: Effort normal.  Abdominal: Soft.  Musculoskeletal: Normal range of motion. She exhibits no edema.  Mild TTP of left lumbar paraspinal muscles. negative SLR. FROM of cervical, thoracic, and lumbar spine. No step offs. No midline spinal tenderness.   Neurological: She is alert and  oriented to person, place, and time. No cranial nerve deficit. She exhibits normal muscle tone. Coordination normal.  Strength 5/5 throughout. No sensory deficits.  No gait abnormality.  Skin: Skin is warm and dry. No rash noted. She is not diaphoretic. No erythema. No pallor.  Psychiatric: She has a normal mood and affect. Her behavior is normal.  Nursing note and vitals reviewed.   ED Course  Procedures (including critical care time)  DIAGNOSTIC STUDIES: Oxygen Saturation is 99% on RA, normal by my interpretation.    COORDINATION OF CARE: 12:26 PM  - Suspect sciatica. Discussed treatment plan with pt at bedside which includes naproxen. Will also give referral to orthopedist for pt to use as needed. Pt verbalized understanding and agreed to plan.   Labs Review Labs Reviewed  URINALYSIS, ROUTINE W REFLEX MICROSCOPIC (NOT AT [facility]) - Abnormal; Notable for the following:    Hgb urine dipstick TRACE (*)    All other components within normal limits  URINE MICROSCOPIC-ADD ON - Abnormal; Notable for the following:    Squamous Epithelial / LPF 0-5 (*)    Bacteria, UA RARE (*)    All other components within normal limits   MDM   Final diagnoses:  Left-sided low back pain with left-sided sciatica   Patient with back pain.  Pt has history of UTI's but UA here is negative. No neurological deficits and normal neuro exam.  Patient can walk but states is painful.  No loss of bowel or bladder control.  No concern for cauda equina.  No fever, night sweats, weight loss, h/o cancer, IVDU.  Suspect sciatica. RICE protocol and pain medicine indicated and discussed with patient. Will give orthopedic referral for pt to use if symptoms persist after trying conservative therapies. Pt verbalized understanding and agreed to plan.    I personally performed the services described in this documentation, which was scribed in my presence. The recorded information has been reviewed and is accurate.      Dondra [place], PA-C 09/29/15 [id number]  Lajean Saver, MD 09/30/15 [phone]

## 2015-09-28 NOTE — Discharge Instructions (Signed)
Sciatica °Sciatica is pain, weakness, numbness, or tingling along the path of the sciatic nerve. The nerve starts in the lower back and runs down the back of each leg. The nerve controls the muscles in the lower leg and in the back of the knee, while also providing sensation to the back of the thigh, lower leg, and the sole of your foot. Sciatica is a symptom of another medical condition. For instance, nerve damage or certain conditions, such as a herniated disk or bone spur on the spine, pinch or put pressure on the sciatic nerve. This causes the pain, weakness, or other sensations normally associated with sciatica. Generally, sciatica only affects one side of the body. °CAUSES  °· Herniated or slipped disc. °· Degenerative disk disease. °· A pain disorder involving the narrow muscle in the buttocks (piriformis syndrome). °· Pelvic injury or fracture. °· Pregnancy. °· Tumor (rare). °SYMPTOMS  °Symptoms can vary from mild to very severe. The symptoms usually travel from the low back to the buttocks and down the back of the leg. Symptoms can include: °· Mild tingling or dull aches in the lower back, leg, or hip. °· Numbness in the back of the calf or sole of the foot. °· Burning sensations in the lower back, leg, or hip. °· Sharp pains in the lower back, leg, or hip. °· Leg weakness. °· Severe back pain inhibiting movement. °These symptoms may get worse with coughing, sneezing, laughing, or prolonged sitting or standing. Also, being overweight may worsen symptoms. °DIAGNOSIS  °Your caregiver will perform a physical exam to look for common symptoms of sciatica. He or she may ask you to do certain movements or activities that would trigger sciatic nerve pain. Other tests may be performed to find the cause of the sciatica. These may include: °· Blood tests. °· X-rays. °· Imaging tests, such as an MRI or CT scan. °TREATMENT  °Treatment is directed at the cause of the sciatic pain. Sometimes, treatment is not necessary  and the pain and discomfort goes away on its own. If treatment is needed, your caregiver may suggest: °· Over-the-counter medicines to relieve pain. °· Prescription medicines, such as anti-inflammatory medicine, muscle relaxants, or narcotics. °· Applying heat or ice to the painful area. °· Steroid injections to lessen pain, irritation, and inflammation around the nerve. °· Reducing activity during periods of pain. °· Exercising and stretching to strengthen your abdomen and improve flexibility of your spine. Your caregiver may suggest losing weight if the extra weight makes the back pain worse. °· Physical therapy. °· Surgery to eliminate what is pressing or pinching the nerve, such as a bone spur or part of a herniated disk. °HOME CARE INSTRUCTIONS  °· Only take over-the-counter or prescription medicines for pain or discomfort as directed by your caregiver. °· Apply ice to the affected area for 20 minutes, 3-4 times a day for the first 48-72 hours. Then try heat in the same way. °· Exercise, stretch, or perform your usual activities if these do not aggravate your pain. °· Attend physical therapy sessions as directed by your caregiver. °· Keep all follow-up appointments as directed by your caregiver. °· Do not wear high heels or shoes that do not provide proper support. °· Check your mattress to see if it is too soft. A firm mattress may lessen your pain and discomfort. °SEEK IMMEDIATE MEDICAL CARE IF:  °· You lose control of your bowel or bladder (incontinence). °· You have increasing weakness in the lower back, pelvis, buttocks,   or legs.  You have redness or swelling of your back.  You have a burning sensation when you urinate.  You have pain that gets worse when you lie down or awakens you at night.  Your pain is worse than you have experienced in the past.  Your pain is lasting longer than 4 weeks.  You are suddenly losing weight without reason. MAKE SURE YOU:  Understand these  instructions.  Will watch your condition.  Will get help right away if you are not doing well or get worse.   This information is not intended to replace advice given to you by your health care provider. Make sure you discuss any questions you have with your health care provider.   Follow up with your PCP if your symptoms do not improve. Apply ice to the affected area. Return to the ED if you experience fever, bowel/bladder incontinence, saddle anesthesia.

## 2015-10-01 ENCOUNTER — Ambulatory Visit
Admission: RE | Admit: 2015-10-01 | Discharge: 2015-10-01 | Disposition: A | Discharge status: Home / Self Care | Payer: Medicaid Other | Source: Ambulatory Visit

## 2015-10-01 DIAGNOSIS — Z1231 Encounter for screening mammogram for malignant neoplasm of breast: Secondary | ICD-10-CM

## 2016-01-23 ENCOUNTER — Ambulatory Visit (HOSPITAL_COMMUNITY)
Admission: EM | Admit: 2016-01-23 | Discharge: 2016-01-23 | Disposition: A | Discharge status: Home / Self Care | Payer: Medicaid Other | Attending: Family Medicine | Admitting: Family Medicine

## 2016-01-23 ENCOUNTER — Encounter (HOSPITAL_COMMUNITY): Payer: Self-pay | Admitting: Emergency Medicine

## 2016-01-23 DIAGNOSIS — J4520 Mild intermittent asthma, uncomplicated: Secondary | ICD-10-CM

## 2016-01-23 DIAGNOSIS — R0982 Postnasal drip: Secondary | ICD-10-CM

## 2016-01-23 MED ORDER — PREDNISONE 20 MG PO TABS: ORAL_TABLET | ORAL | Status: DC

## 2016-01-23 NOTE — ED Notes (Signed)
C/o cold sx onset x6 days associated w/chest congestion, back pain, and a cough... Using her rescue inhaler w/temp relief.

## 2016-01-23 NOTE — ED Provider Notes (Signed)
CSN: [id number]     Arrival date & time 01/23/16  1928 History   First MD Initiated Contact with Patient 01/23/16 1940     Chief Complaint  Patient presents with  . URI   (Consider location/radiation/quality/duration/timing/severity/associated sxs/prior Treatment) HPI Comments: 45 year old female with a past history of asthma and allergies is now complaining of anterior chest congestion and cough.She last used her inhaler 5 days ago. She is also having soreness across the low back.she is currently taking Claritin 10 mg and Flonase nasal spray.  Patient is a 45 y.o. female presenting with URI.  URI Presenting symptoms: congestion and cough   Presenting symptoms: no fatigue, no fever and no sore throat     Past Medical History  Diagnosis Date  . Asthma   . OAB (overactive bladder)   . Fibroids    Past Surgical History  Procedure Laterality Date  . Cesarean section  1994, 2002, 2005,     x 3  . Abdominal hysterectomy  02/05/2006    partial   No family history on file. Social History  Substance Use Topics  . Smoking status: Never Smoker   . Smokeless tobacco: Never Used  . Alcohol Use: No   OB History    Gravida Para Term Preterm AB TAB SAB Ectopic Multiple Living   1 1 1       1      Review of Systems  Constitutional: Negative for fever, activity change and fatigue.  HENT: Positive for congestion and postnasal drip. Negative for sore throat.   Eyes: Negative.   Respiratory: Positive for cough. Negative for shortness of breath.   Cardiovascular: Negative.   Musculoskeletal: Negative.   Skin: Negative.     Allergies  Review of patient's allergies indicates no known allergies.  Home Medications   Prior to Admission medications   Medication Sig Start Date End Date Taking? Authorizing Provider  albuterol (PROVENTIL HFA;VENTOLIN HFA) 108 (90 BASE) MCG/ACT inhaler Inhale 1 puff into the lungs daily.    Yes Historical Provider, MD  albuterol (PROVENTIL) (2.5 MG/3ML)  0.083% nebulizer solution Take 2.5 mg by nebulization every 6 (six) hours as needed for wheezing or shortness of breath.    Historical Provider, MD  cyclobenzaprine (FLEXERIL) 10 MG tablet Take 1 tablet (10 mg total) by mouth 2 (two) times daily as needed for muscle spasms. 09/28/15   Samantha Tripp Dowless, PA-C  ibuprofen (ADVIL,MOTRIN) 800 MG tablet Take 800 mg by mouth every 8 (eight) hours as needed for moderate pain. 01/15/14   Dalia Heading, PA-C  naproxen (NAPROSYN) 500 MG tablet Take 1 tablet (500 mg total) by mouth 2 (two) times daily. 09/28/15   Samantha Tripp Dowless, PA-C  predniSONE (DELTASONE) 20 MG tablet Take 3 tabs po on first day, 2 tabs second day, 2 tabs third day, 1 tab fourth day, 1 tab 5th day. Take with food. 01/23/16   Janne Napoleon, NP   Meds Ordered and Administered this Visit  Medications - No data to display  BP 133/81 mmHg  Pulse 96  Temp(Src) 98.3 F (36.8 C) (Oral)  Resp 16  SpO2 99% No data found.   Physical Exam  Constitutional: She is oriented to person, place, and time. She appears well-developed and well-nourished. No distress.  HENT:  Mouth/Throat: No oropharyngeal exudate.  Bilateral TMs are normal. Oropharynx with minor erythema.  Eyes: Conjunctivae and EOM are normal.  Neck: Normal range of motion. Neck supple.  Cardiovascular: Normal rate, regular rhythm and normal heart sounds.  Pulmonary/Chest: Effort normal and breath sounds normal. No respiratory distress.  Tidal volume auscultation is normal. Lungs clear. With forced expiration and cough there is faint end-expiratory wheeze.  Musculoskeletal: Normal range of motion. She exhibits no edema.  Lymphadenopathy:    She has no cervical adenopathy.  Neurological: She is alert and oriented to person, place, and time.  Skin: Skin is warm and dry. No rash noted.  Psychiatric: She has a normal mood and affect.  Nursing note and vitals reviewed.   ED Course  Procedures (including critical  care time)  Labs Review Labs Reviewed - No data to display  Imaging Review No results found.   Visual Acuity Review  Right Eye Distance:   Left Eye Distance:   Bilateral Distance:    Right Eye Near:   Left Eye Near:    Bilateral Near:         MDM   1. PND (post-nasal drip)   2. Asthma, mild intermittent, uncomplicated     Use your albuterol inhaler a little more frequently particularly when you are having cough or sensation of chest congestion or shortness of breath. Start taking the prednisone tablets tomorrow as directed Use saline nasal spray as needed and continuethe other medications you are currently taking.  Meds ordered this encounter  Medications  . predniSONE (DELTASONE) 20 MG tablet    Sig: Take 3 tabs po on first day, 2 tabs second day, 2 tabs third day, 1 tab fourth day, 1 tab 5th day. Take with food.    Dispense:  9 tablet    Refill:  0    Order Specific Question:  Supervising Provider    Answer:  Billy Fischer [[id number]]      Janne Napoleon, NP 01/23/16 2010

## 2016-01-23 NOTE — Discharge Instructions (Signed)
Asthma, Acute Bronchospasm Use your albuterol inhaler a little more frequently particularly when you are having cough or sensation of chest congestion or shortness of breath. Start taking the prednisone tablets tomorrow as directed Use saline nasal spray as needed and continuethe other medications you are currently taking. Acute bronchospasm caused by asthma is also referred to as an asthma attack. Bronchospasm means your air passages become narrowed. The narrowing is caused by inflammation and tightening of the muscles in the air tubes (bronchi) in your lungs. This can make it hard to breathe or cause you to wheeze and cough. CAUSES Possible triggers are:  Animal dander from the skin, hair, or feathers of animals.  Dust mites contained in house dust.  Cockroaches.  Pollen from trees or grass.  Mold.  Cigarette or tobacco smoke.  Air pollutants such as dust, household cleaners, hair sprays, aerosol sprays, paint fumes, strong chemicals, or strong odors.  Cold air or weather changes. Cold air may trigger inflammation. Winds increase molds and pollens in the air.  Strong emotions such as crying or laughing hard.  Stress.  Certain medicines such as aspirin or beta-blockers.  Sulfites in foods and drinks, such as dried fruits and wine.  Infections or inflammatory conditions, such as a flu, cold, or inflammation of the nasal membranes (rhinitis).  Gastroesophageal reflux disease (GERD). GERD is a condition where stomach acid backs up into your esophagus.  Exercise or strenuous activity. SIGNS AND SYMPTOMS   Wheezing.  Excessive coughing, particularly at night.  Chest tightness.  Shortness of breath. DIAGNOSIS  Your health care provider will ask you about your medical history and perform a physical exam. A chest X-ray or blood testing may be performed to look for other causes of your symptoms or other conditions that may have triggered your asthma attack. TREATMENT    Treatment is aimed at reducing inflammation and opening up the airways in your lungs. Most asthma attacks are treated with inhaled medicines. These include quick relief or rescue medicines (such as bronchodilators) and controller medicines (such as inhaled corticosteroids). These medicines are sometimes given through an inhaler or a nebulizer. Systemic steroid medicine taken by mouth or given through an IV tube also can be used to reduce the inflammation when an attack is moderate or severe. Antibiotic medicines are only used if a bacterial infection is present.  HOME CARE INSTRUCTIONS   Rest.  Drink plenty of liquids. This helps the mucus to remain thin and be easily coughed up. Only use caffeine in moderation and do not use alcohol until you have recovered from your illness.  Do not smoke. Avoid being exposed to secondhand smoke.  You play a critical role in keeping yourself in good health. Avoid exposure to things that cause you to wheeze or to have breathing problems.  Keep your medicines up-to-date and available. Carefully follow your health care provider's treatment plan.  Take your medicine exactly as prescribed.  When pollen or pollution is bad, keep windows closed and use an air conditioner or go to places with air conditioning.  Asthma requires careful medical care. See your health care provider for a follow-up as advised. If you are more than [redacted] weeks pregnant and you were prescribed any new medicines, let your obstetrician know about the visit and how you are doing. Follow up with your health care provider as directed.  After you have recovered from your asthma attack, make an appointment with your outpatient doctor to talk about ways to reduce the likelihood of  future attacks. If you do not have a doctor who manages your asthma, make an appointment with a primary care doctor to discuss your asthma. SEEK IMMEDIATE MEDICAL CARE IF:   You are getting worse.  You have trouble  breathing. If severe, call your local emergency services (911 in the [place]  You develop chest pain or discomfort.  You are vomiting.  You are not able to keep fluids down.  You are coughing up yellow, green, brown, or bloody sputum.  You have a fever and your symptoms suddenly get worse.  You have trouble swallowing. MAKE SURE YOU:   Understand these instructions.  Will watch your condition.  Will get help right away if you are not doing well or get worse.   This information is not intended to replace advice given to you by your health care provider. Make sure you discuss any questions you have with your health care provider.   Document Released: 01/03/2007 Document Revised: 09/23/2013 Document Reviewed: 03/26/2013 Elsevier Interactive Patient Education 2016 Else[employer]  Cough, Adult Coughing is a reflex that clears your throat and your airways. Coughing helps to heal and protect your lungs. It is normal to cough occasionally, but a cough that happens with other symptoms or lasts a long time may be a sign of a condition that needs treatment. A cough may last only 2-3 weeks (acute), or it may last longer than 8 weeks (chronic). CAUSES Coughing is commonly caused by:  Breathing in substances that irritate your lungs.  A viral or bacterial respiratory infection.  Allergies.  Asthma.  Postnasal drip.  Smoking.  Acid backing up from the stomach into the esophagus (gastroesophageal reflux).  Certain medicines.  Chronic lung problems, including COPD (or rarely, lung cancer).  Other medical conditions such as heart failure. HOME CARE INSTRUCTIONS  Pay attention to any changes in your symptoms. Take these actions to help with your discomfort:  Take medicines only as told by your health care provider.  If you were prescribed an antibiotic medicine, take it as told by your health care provider. Do not stop taking the antibiotic even if you start to feel better.  Talk  with your health care provider before you take a cough suppressant medicine.  Drink enough fluid to keep your urine clear or pale yellow.  If the air is dry, use a cold steam vaporizer or humidifier in your bedroom or your home to help loosen secretions.  Avoid anything that causes you to cough at work or at home.  If your cough is worse at night, try sleeping in a semi-upright position.  Avoid cigarette smoke. If you smoke, quit smoking. If you need help quitting, ask your health care provider.  Avoid caffeine.  Avoid alcohol.  Rest as needed. SEEK MEDICAL CARE IF:   You have new symptoms.  You cough up pus.  Your cough does not get better after 2-3 weeks, or your cough gets worse.  You cannot control your cough with suppressant medicines and you are losing sleep.  You develop pain that is getting worse or pain that is not controlled with pain medicines.  You have a fever.  You have unexplained weight loss.  You have night sweats. SEEK IMMEDIATE MEDICAL CARE IF:  You cough up blood.  You have difficulty breathing.  Your heartbeat is very fast.   This information is not intended to replace advice given to you by your health care provider. Make sure you discuss any questions you have with  your health care provider.   Document Released: 03/17/2011 Document Revised: 06/09/2015 Document Reviewed: 11/25/2014 Elsevier Interactive Patient Education [employer].

## 2016-03-15 ENCOUNTER — Encounter (HOSPITAL_COMMUNITY): Payer: Self-pay | Admitting: Family Medicine

## 2016-03-15 ENCOUNTER — Emergency Department (HOSPITAL_COMMUNITY)
Admission: EM | Admit: 2016-03-15 | Discharge: 2016-03-15 | Disposition: A | Discharge status: Home / Self Care | Payer: Medicaid Other | Attending: Emergency Medicine | Admitting: Emergency Medicine

## 2016-03-15 DIAGNOSIS — Y939 Activity, unspecified: Secondary | ICD-10-CM | POA: Diagnosis not present

## 2016-03-15 DIAGNOSIS — Y999 Unspecified external cause status: Secondary | ICD-10-CM | POA: Diagnosis not present

## 2016-03-15 DIAGNOSIS — Y92410 Unspecified street and highway as the place of occurrence of the external cause: Secondary | ICD-10-CM | POA: Diagnosis not present

## 2016-03-15 DIAGNOSIS — Z79899 Other long term (current) drug therapy: Secondary | ICD-10-CM | POA: Diagnosis not present

## 2016-03-15 DIAGNOSIS — J45909 Unspecified asthma, uncomplicated: Secondary | ICD-10-CM | POA: Diagnosis not present

## 2016-03-15 DIAGNOSIS — M545 Low back pain: Secondary | ICD-10-CM | POA: Diagnosis present

## 2016-03-15 DIAGNOSIS — M549 Dorsalgia, unspecified: Secondary | ICD-10-CM

## 2016-03-15 DIAGNOSIS — Z791 Long term (current) use of non-steroidal anti-inflammatories (NSAID): Secondary | ICD-10-CM | POA: Insufficient documentation

## 2016-03-15 MED ORDER — NAPROXEN 500 MG PO TABS: 500.0000 mg | ORAL_TABLET | Freq: Two times a day (BID) | ORAL | Status: DC

## 2016-03-15 MED ORDER — METHOCARBAMOL 500 MG PO TABS: 500.0000 mg | ORAL_TABLET | Freq: Two times a day (BID) | ORAL | Status: DC

## 2016-03-15 NOTE — ED Notes (Signed)
Pt is in stable condition upon d/c and ambulates from ED. 
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
## 2016-03-15 NOTE — Discharge Instructions (Signed)
Take your medications as prescribed. I recommend resting and applying ice to affected area for 15-20 minutes 3-4 times daily. Follow-up with your primary care provider within the next week if your symptoms have not improved. Please return to the Emergency Department if symptoms worsen or new onset of fever, numbness, tingling, saddle anesthesia, loss of bowel or bladder, weakness.
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
## 2016-05-15 ENCOUNTER — Ambulatory Visit (HOSPITAL_COMMUNITY)
Admission: EM | Admit: 2016-05-15 | Discharge: 2016-05-15 | Disposition: A | Discharge status: Home / Self Care | Payer: Medicaid Other

## 2016-05-15 ENCOUNTER — Encounter (HOSPITAL_COMMUNITY): Payer: Self-pay | Admitting: Emergency Medicine

## 2016-05-15 DIAGNOSIS — A09 Infectious gastroenteritis and colitis, unspecified: Secondary | ICD-10-CM

## 2016-05-15 MED ORDER — DIPHENOXYLATE-ATROPINE 2.5-0.025 MG PO TABS: 2.0000 | ORAL_TABLET | Freq: Four times a day (QID) | ORAL | 0 refills | Status: DC | PRN

## 2016-05-15 NOTE — ED Triage Notes (Signed)
The patient presented to the [facility] with a complaint of a headache and fever the last three days and diarrhea that occurred 3 days ago but has stopped.

## 2016-05-15 NOTE — ED Provider Notes (Signed)
[facility]    CSN: [id number] Arrival date & time: 05/15/16  1949  First Provider Contact:  First MD Initiated Contact with Patient 05/15/16 2039        History   Chief Complaint No chief complaint on file.   HPI Joyce Welch is a 45 y.o. female.   The history is provided by the patient.  Diarrhea  Quality:  Watery Severity:  Mild Onset quality:  Sudden Duration:  3 days Progression:  Partially resolved Relieved by:  None tried Worsened by:  Nothing Ineffective treatments:  None tried Associated symptoms: no abdominal pain, no chills, no recent cough, no fever and no vomiting   Risk factors: no sick contacts, no suspicious food intake and no travel to endemic areas     Past Medical History:  Diagnosis Date  . Asthma   . Fibroids   . OAB (overactive bladder)     Patient Active Problem List   Diagnosis Date Noted  . Strain of quadriceps tendon 02/14/2014  . Symptomatic menopausal or female climacteric states 07/31/2013    Past Surgical History:  Procedure Laterality Date  . ABDOMINAL HYSTERECTOMY  02/05/2006   partial  . [place], 2002, 2005,    x 3    OB History    Gravida Para Term Preterm AB Living   1 1 1     1    SAB TAB Ectopic Multiple Live Births                   Home Medications    Prior to Admission medications   Medication Sig Start Date End Date Taking? Authorizing Provider  albuterol (PROVENTIL HFA;VENTOLIN HFA) 108 (90 BASE) MCG/ACT inhaler Inhale 1 puff into the lungs daily.     Historical Provider, MD  albuterol (PROVENTIL) (2.5 MG/3ML) 0.083% nebulizer solution Take 2.5 mg by nebulization every 6 (six) hours as needed for wheezing or shortness of breath.    Historical Provider, MD  cyclobenzaprine (FLEXERIL) 10 MG tablet Take 1 tablet (10 mg total) by mouth 2 (two) times daily as needed for muscle spasms. 09/28/15   Samantha Tripp Dowless, PA-C  ibuprofen (ADVIL,MOTRIN) 800 MG tablet Take 800 mg by mouth every  8 (eight) hours as needed for moderate pain. 01/15/14   Dalia Heading, PA-C  methocarbamol (ROBAXIN) 500 MG tablet Take 1 tablet (500 mg total) by mouth 2 (two) times daily. 03/15/16   Nona Dell, PA-C  naproxen (NAPROSYN) 500 MG tablet Take 1 tablet (500 mg total) by mouth 2 (two) times daily. 03/15/16   Nona Dell, PA-C  predniSONE (DELTASONE) 20 MG tablet Take 3 tabs po on first day, 2 tabs second day, 2 tabs third day, 1 tab fourth day, 1 tab 5th day. Take with food. 01/23/16   Janne Napoleon, NP    Family History No family history on file.  Social History Social History  Substance Use Topics  . Smoking status: Never Smoker  . Smokeless tobacco: Never Used  . Alcohol use No     Allergies   Review of patient's allergies indicates no known allergies.   Review of Systems Review of Systems  Constitutional: Negative.  Negative for chills and fever.  HENT: Negative.   Respiratory: Negative.   Gastrointestinal: Positive for diarrhea. Negative for abdominal pain, blood in stool, nausea and vomiting.  Genitourinary: Negative.   All other systems reviewed and are negative.    Physical Exam Triage Vital Signs ED Triage Vitals [05/15/16  2032]  Enc Vitals Group     BP 132/58     Pulse Rate 95     Resp      Temp 99.7 F (37.6 C)     Temp Source Oral     SpO2 100 %     Weight      Height      Head Circumference      Peak Flow      Pain Score      Pain Loc      Pain Edu?      Excl. in [place]?    No data found.   Updated Vital Signs BP 132/58 (BP Location: Right Arm)   Pulse 95   Temp 99.7 F (37.6 C) (Oral)   SpO2 100%   Visual Acuity Right Eye Distance:   Left Eye Distance:   Bilateral Distance:    Right Eye Near:   Left Eye Near:    Bilateral Near:     Physical Exam  Constitutional: She is oriented to person, place, and time. She appears well-developed and well-nourished. No distress.  HENT:  Right Ear: External ear normal.  Left  Ear: External ear normal.  Mouth/Throat: Oropharynx is clear and moist.  Eyes: Pupils are equal, round, and reactive to light.  Neck: Normal range of motion. Neck supple.  Cardiovascular: Normal rate, regular rhythm, normal heart sounds and intact distal pulses.   Pulmonary/Chest: Effort normal and breath sounds normal.  Abdominal: Soft. Bowel sounds are normal. She exhibits no distension and no mass. There is no tenderness. There is no guarding.  Lymphadenopathy:    She has no cervical adenopathy.  Neurological: She is alert and oriented to person, place, and time.  Skin: Skin is warm and dry.  Nursing note and vitals reviewed.    UC Treatments / Results  Labs (all labs ordered are listed, but only abnormal results are displayed) Labs Reviewed - No data to display  EKG  EKG Interpretation None       Radiology No results found.  Procedures Procedures (including critical care time)  Medications Ordered in UC Medications - No data to display   Initial Impression / Assessment and Plan / UC Course  I have reviewed the triage vital signs and the nursing notes.  Pertinent labs & imaging results that were available during my care of the patient were reviewed by me and considered in my medical decision making (see chart for details).  Clinical Course      Final Clinical Impressions(s) / UC Diagnoses   Final diagnoses:  None    New Prescriptions New Prescriptions   No medications on file     Billy Fischer, MD 05/15/16 2050

## 2016-06-15 ENCOUNTER — Encounter (HOSPITAL_COMMUNITY): Payer: Self-pay | Admitting: Emergency Medicine

## 2016-06-15 ENCOUNTER — Emergency Department (HOSPITAL_COMMUNITY)
Admission: EM | Admit: 2016-06-15 | Discharge: 2016-06-15 | Disposition: A | Discharge status: Home / Self Care | Payer: Medicaid Other | Attending: Emergency Medicine | Admitting: Emergency Medicine

## 2016-06-15 ENCOUNTER — Emergency Department (HOSPITAL_COMMUNITY): Payer: Medicaid Other

## 2016-06-15 DIAGNOSIS — Y929 Unspecified place or not applicable: Secondary | ICD-10-CM | POA: Insufficient documentation

## 2016-06-15 DIAGNOSIS — Y939 Activity, unspecified: Secondary | ICD-10-CM | POA: Insufficient documentation

## 2016-06-15 DIAGNOSIS — Y999 Unspecified external cause status: Secondary | ICD-10-CM | POA: Insufficient documentation

## 2016-06-15 DIAGNOSIS — J45909 Unspecified asthma, uncomplicated: Secondary | ICD-10-CM | POA: Diagnosis not present

## 2016-06-15 DIAGNOSIS — X509XXA Other and unspecified overexertion or strenuous movements or postures, initial encounter: Secondary | ICD-10-CM | POA: Insufficient documentation

## 2016-06-15 DIAGNOSIS — S8992XA Unspecified injury of left lower leg, initial encounter: Secondary | ICD-10-CM | POA: Diagnosis not present

## 2016-06-15 NOTE — Discharge Instructions (Signed)
There were no abnormalities on the x-ray. Take it easy, but do not lay around too much as this may make the stiffness worse. Take 500 mg of naproxen every 12 hours or 800 mg of ibuprofen every 8 hours for the next 3 days. Take these medications with food to avoid upset stomach. Use the knee sleeve for comfort. Follow-up with orthopedics should symptoms fail to resolve.

## 2016-06-15 NOTE — ED Provider Notes (Signed)
[facility] DEPT Provider Note   CSN: [id number] Arrival date & time: 06/15/16  [id number]     History   Chief Complaint Chief Complaint  Patient presents with  . Leg Pain    HPI Khadeeja Sawhney is a 45 y.o. female.  HPI   Alantis Seifert is a 45 y.o. female, patient with no pertinent past medical history, presenting to the ED with Left knee injury that occurred last night. Patient states she "stepped wrong" off of a stair step. Patient did not have immediate pain in the left knee. Patient woke up with pain and subjective swelling in the left knee. Pain is mild-to-moderate, throbbing, nonradiating. Patient has not tried any medications for her symptoms. Denies neuro deficits, falls, or any other complaints.  Past Medical History:  Diagnosis Date  . Asthma   . Fibroids   . OAB (overactive bladder)     Patient Active Problem List   Diagnosis Date Noted  . Strain of quadriceps tendon 02/14/2014  . Symptomatic menopausal or female climacteric states 07/31/2013    Past Surgical History:  Procedure Laterality Date  . ABDOMINAL HYSTERECTOMY  02/05/2006   partial  . [place], 2002, 2005,    x 3    OB History    Gravida Para Term Preterm AB Living   1 1 1     1    SAB TAB Ectopic Multiple Live Births                   Home Medications    Prior to Admission medications   Medication Sig Start Date End Date Taking? Authorizing Provider  albuterol (PROVENTIL HFA;VENTOLIN HFA) 108 (90 BASE) MCG/ACT inhaler Inhale 1 puff into the lungs daily.     Historical Provider, MD  albuterol (PROVENTIL) (2.5 MG/3ML) 0.083% nebulizer solution Take 2.5 mg by nebulization every 6 (six) hours as needed for wheezing or shortness of breath.    Historical Provider, MD  cyclobenzaprine (FLEXERIL) 10 MG tablet Take 1 tablet (10 mg total) by mouth 2 (two) times daily as needed for muscle spasms. 09/28/15   Samantha Tripp Dowless, PA-C  diphenoxylate-atropine (LOMOTIL) 2.5-0.025 MG tablet Take 2  tablets by mouth 4 (four) times daily as needed for diarrhea or loose stools. 05/15/16   Billy Fischer, MD  ibuprofen (ADVIL,MOTRIN) 800 MG tablet Take 800 mg by mouth every 8 (eight) hours as needed for moderate pain. 01/15/14   Dalia Heading, PA-C  loratadine (CLARITIN) 10 MG tablet Take 10 mg by mouth daily.    Historical Provider, MD  methocarbamol (ROBAXIN) 500 MG tablet Take 1 tablet (500 mg total) by mouth 2 (two) times daily. 03/15/16   Nona Dell, PA-C  naproxen (NAPROSYN) 500 MG tablet Take 1 tablet (500 mg total) by mouth 2 (two) times daily. 03/15/16   Nona Dell, PA-C  predniSONE (DELTASONE) 20 MG tablet Take 3 tabs po on first day, 2 tabs second day, 2 tabs third day, 1 tab fourth day, 1 tab 5th day. Take with food. 01/23/16   Janne Napoleon, NP    Family History No family history on file.  Social History Social History  Substance Use Topics  . Smoking status: Never Smoker  . Smokeless tobacco: Never Used  . Alcohol use No     Allergies   Review of patient's allergies indicates no known allergies.   Review of Systems Review of Systems  Musculoskeletal: Positive for arthralgias.  Neurological: Negative for weakness and numbness.  Physical Exam Updated Vital Signs BP 132/75 (BP Location: Right Arm)   Pulse 96   Temp 98 F (36.7 C) (Oral)   Resp 18   Ht 5\' 7"  (1.702 m)   Wt 97.5 kg   SpO2 100%   BMI 33.67 kg/m   Physical Exam  Constitutional: She appears well-developed and well-nourished. No distress.  HENT:  Head: Normocephalic and atraumatic.  Eyes: Conjunctivae are normal.  Neck: Neck supple.  Cardiovascular: Normal rate and regular rhythm.   Pulmonary/Chest: Effort normal.  Musculoskeletal: She exhibits tenderness.  Minor tenderness to the posterior left knee. Full range of motion intact. Patient is weightbearing.  Neurological: She is alert.  No sensory deficits in the bilateral lower extremities. Strength 5 out of 5  bilaterally.  Skin: Skin is warm and dry. She is not diaphoretic.  Psychiatric: She has a normal mood and affect. Her behavior is normal.  Nursing note and vitals reviewed.    ED Treatments / Results  Labs (all labs ordered are listed, but only abnormal results are displayed) Labs Reviewed - No data to display  EKG  EKG Interpretation None       Radiology Dg Knee Complete 4 Views Left  Result Date: 06/15/2016 CLINICAL DATA:  Patellar left knee pain after injury 1 day prior. EXAM: LEFT KNEE - COMPLETE 4+ VIEW COMPARISON:  None. FINDINGS: No fracture, joint effusion, dislocation or suspicious focal osseous lesion. Small superior and moderate inferior left patellar enthesophytes. No appreciable degenerative or erosive arthropathy. No radiopaque foreign body. IMPRESSION: 1. No fracture, joint effusion or malalignment. 2. Small superior and moderate inferior left patellar enthesophytes. Electronically Signed   By: Ilona Sorrel M.D.   On: 06/15/2016 09:22    Procedures Procedures (including critical care time)  Medications Ordered in ED Medications - No data to display   Initial Impression / Assessment and Plan / ED Course  I have reviewed the triage vital signs and the nursing notes.  Pertinent labs & imaging results that were available during my care of the patient were reviewed by me and considered in my medical decision making (see chart for details).  Clinical Course     Patient with left knee injury that occurred last night. No abnormalities on x-ray. Ortho referral should symptoms continue.  Final Clinical Impressions(s) / ED Diagnoses   Final diagnoses:  Knee injury, left, initial encounter    New Prescriptions Discharge Medication List as of 06/15/2016  9:30 AM       Lorayne Bender, PA-C 06/15/16 [place] Liu, MD 06/15/16 [phone]

## 2016-06-15 NOTE — ED Triage Notes (Signed)
Patient states stepped down on something last night and felt something pull.  Patient states now having pain behind L knee.   Denies other symptoms.

## 2016-07-22 ENCOUNTER — Emergency Department (HOSPITAL_COMMUNITY)
Admission: EM | Admit: 2016-07-22 | Discharge: 2016-07-22 | Disposition: A | Discharge status: Home / Self Care | Payer: Medicaid Other | Attending: Emergency Medicine | Admitting: Emergency Medicine

## 2016-07-22 ENCOUNTER — Encounter (HOSPITAL_COMMUNITY): Payer: Self-pay | Admitting: Emergency Medicine

## 2016-07-22 ENCOUNTER — Emergency Department (HOSPITAL_COMMUNITY): Payer: Medicaid Other

## 2016-07-22 DIAGNOSIS — I10 Essential (primary) hypertension: Secondary | ICD-10-CM | POA: Insufficient documentation

## 2016-07-22 DIAGNOSIS — K30 Functional dyspepsia: Secondary | ICD-10-CM | POA: Insufficient documentation

## 2016-07-22 DIAGNOSIS — J45909 Unspecified asthma, uncomplicated: Secondary | ICD-10-CM | POA: Diagnosis not present

## 2016-07-22 DIAGNOSIS — R1013 Epigastric pain: Secondary | ICD-10-CM

## 2016-07-22 DIAGNOSIS — R0789 Other chest pain: Secondary | ICD-10-CM | POA: Diagnosis present

## 2016-07-22 DIAGNOSIS — K2970 Gastritis, unspecified, without bleeding: Secondary | ICD-10-CM | POA: Diagnosis not present

## 2016-07-22 LAB — COMPREHENSIVE METABOLIC PANEL
ALT: 14 U/L (ref 14–54)
AST: 18 U/L (ref 15–41)
Albumin: 3.2 g/dL — ABNORMAL LOW (ref 3.5–5.0)
Alkaline Phosphatase: 85 U/L (ref 38–126)
Anion gap: 4 — ABNORMAL LOW (ref 5–15)
BUN: 12 mg/dL (ref 6–20)
CO2: 24 mmol/L (ref 22–32)
Calcium: 9 mg/dL (ref 8.9–10.3)
Chloride: 109 mmol/L (ref 101–111)
Creatinine, Ser: 0.93 mg/dL (ref 0.44–1.00)
GFR calc Af Amer: >60 mL/min (ref >60)
GFR calc non Af Amer: >60 mL/min (ref >60)
Glucose, Bld: 100 mg/dL — ABNORMAL HIGH (ref 65–99)
Potassium: 4.5 mmol/L (ref 3.5–5.1)
Sodium: 137 mmol/L (ref 135–145)
Total Bilirubin: 0.6 mg/dL (ref 0.3–1.2)
Total Protein: 6.8 g/dL (ref 6.5–8.1)

## 2016-07-22 LAB — CBC WITH DIFFERENTIAL/PLATELET
Basophils Absolute: 0 10*3/uL (ref 0.0–0.1)
Basophils Relative: 0 %
Eosinophils Absolute: 0.1 10*3/uL (ref 0.0–0.7)
Eosinophils Relative: 2 %
HCT: 35.5 % — ABNORMAL LOW (ref 36.0–46.0)
Hemoglobin: 11.4 g/dL — ABNORMAL LOW (ref 12.0–15.0)
Lymphocytes Relative: 33 %
Lymphs Abs: 2.2 10*3/uL (ref 0.7–4.0)
MCH: 25.3 pg — ABNORMAL LOW (ref 26.0–34.0)
MCHC: 32.1 g/dL (ref 30.0–36.0)
MCV: 78.9 fL (ref 78.0–100.0)
Monocytes Absolute: 0.3 10*3/uL (ref 0.1–1.0)
Monocytes Relative: 5 %
Neutro Abs: 3.8 10*3/uL (ref 1.7–7.7)
Neutrophils Relative %: 60 %
Platelets: 253 10*3/uL (ref 150–400)
RBC: 4.5 MIL/uL (ref 3.87–5.11)
RDW: 13.2 % (ref 11.5–15.5)
WBC: 6.4 10*3/uL (ref 4.0–10.5)

## 2016-07-22 LAB — I-STAT TROPONIN, ED
Troponin i, poc: 0 ng/mL (ref 0.00–0.08)
Troponin i, poc: 0 ng/mL (ref 0.00–0.08)

## 2016-07-22 LAB — LIPASE, BLOOD: Lipase: 20 U/L (ref 11–51)

## 2016-07-22 MED ORDER — GI COCKTAIL ~~LOC~~
30.0000 mL | Freq: Once | ORAL | Status: AC
  Administered 2016-07-22: 30 mL via ORAL
  Filled 2016-07-22: qty 30

## 2016-07-22 MED ORDER — ASPIRIN 81 MG PO CHEW
324.0000 mg | CHEWABLE_TABLET | Freq: Once | ORAL | Status: AC
  Administered 2016-07-22: 324 mg via ORAL
  Filled 2016-07-22: qty 4

## 2016-07-22 NOTE — Discharge Instructions (Signed)
Your labs, xray, and EKG are all reassuring, your chest pain could be a variety of things including gas pain, indigestion/acid reflux or a stomach ulcer, muscle pain, and other nonemergent issues. You will need to continue taking prilosec as directed by your primary doctor, and take additional over the counter zantac as needed for additional relief of symptoms. Avoid spicy/fatty/acidic foods, avoid soda/coffee/tea/alcohol. Avoid laying down flat within 30 minutes of eating. Avoid NSAIDs like ibuprofen/aleve/motrin/etc on an empty stomach. May consider using over the counter tums/maalox as needed for additional relief. Use tylenol as needed for pain. Stay well hydrated. You may consider using heat to the areas of pain, no more than 20 minutes every hour. Follow up with your regular doctor in 5-7 days for recheck of symptoms. Return to the ER for changes or worsening symptoms.  SEEK IMMEDIATE MEDICAL ATTENTION IF: You develop a fever.  Your chest pains become severe or intolerable.  You develop new, unexplained symptoms (problems).  You develop shortness of breath, nausea, vomiting, sweating or feel light headed.  You develop a new cough or you cough up blood. You develop new leg swelling

## 2016-07-22 NOTE — ED Provider Notes (Signed)
[facility] DEPT Provider Note   CSN: [id number] Arrival date & time: 07/22/16  0801     History   Chief Complaint Chief Complaint  Patient presents with  . Chest Pain    HPI Joyce Welch is a 45 y.o. female with a PMHx of ?HTN (unclear if this is diagnosed or not), asthma, fibroids s/p hysterectomy, and overactive bladder, who presents to the ED with complaints of sudden onset left-sided chest pain that began around 7 AM upon awakening from rest. She describes the pain as 6/10 intermittent lasting approximately 5-6 minutes at a time,8 left-sided chest pain that is nonradiating, with no known aggravating factors, unchanged with exertion or inspiration, improved somewhat with rest, and with no other treatments tried prior to arrival. Patient states that she had "a little" shortness of breath as well. States that recently she's been having a lot of abdominal bloating and upper abdominal discomfort, went to see her PCP 4 days ago and was told that she had "an ulcer or indigestion", started on Prilosec as well as clonidine (unclear if this was started for hot flashes or for HTN). She last took those medications at 8 PM last night. She denies any fevers, chills, URI symptoms or cold symptoms, cough, wheezing, lightheadedness, diaphoresis, palpitations, leg swelling, recent travel/surgery/immobilization, family or personal history of DVT/PE, estrogen use, ongoing abdominal pain, nausea, vomiting, diarrhea, constipation, dysuria, hematuria, numbness, tingling, focal weakness, claudication, orthopnea. She is a nonsmoker. Positive family history of MI in her father at 67 years old, still alive and well.    The history is provided by the patient and medical records. No language interpreter was used.  Chest Pain   This is a new problem. The current episode started less than 1 hour ago. Episode frequency: intermittently. The problem has not changed since onset.The pain is associated with rest. The pain is  present in the lateral region. The pain is at a severity of 6/10. The pain is mild. The quality of the pain is described as brief (and tight). The pain does not radiate. Duration of episode(s) is 6 minutes. Exacerbated by: nothing. Associated symptoms include shortness of breath ("a little"). Pertinent negatives include no abdominal pain, no claudication, no cough, no diaphoresis, no dizziness, no fever, no hemoptysis, no irregular heartbeat, no lower extremity edema, no nausea, no numbness, no orthopnea, no palpitations, no vomiting and no weakness. She has tried rest for the symptoms. The treatment provided significant relief. Risk factors include obesity.  Her past medical history is significant for hypertension.  Pertinent negatives for past medical history include no diabetes, no DVT and no PE.  Her family medical history is significant for heart disease (father had MI at 66y/o, survived).  Pertinent negatives for family medical history include: no PE.    Past Medical History:  Diagnosis Date  . Asthma   . Fibroids   . OAB (overactive bladder)     Patient Active Problem List   Diagnosis Date Noted  . Strain of quadriceps tendon 02/14/2014  . Symptomatic menopausal or female climacteric states 07/31/2013    Past Surgical History:  Procedure Laterality Date  . ABDOMINAL HYSTERECTOMY  02/05/2006   partial  . [place], 2002, 2005,    x 3    OB History    Gravida Para Term Preterm AB Living   1 1 1     1    SAB TAB Ectopic Multiple Live Births  Home Medications    Prior to Admission medications   Medication Sig Start Date End Date Taking? Authorizing Provider  albuterol (PROVENTIL HFA;VENTOLIN HFA) 108 (90 BASE) MCG/ACT inhaler Inhale 1 puff into the lungs daily.     Historical Provider, MD  albuterol (PROVENTIL) (2.5 MG/3ML) 0.083% nebulizer solution Take 2.5 mg by nebulization every 6 (six) hours as needed for wheezing or shortness of breath.     Historical Provider, MD  cyclobenzaprine (FLEXERIL) 10 MG tablet Take 1 tablet (10 mg total) by mouth 2 (two) times daily as needed for muscle spasms. 09/28/15   Samantha Tripp Dowless, PA-C  diphenoxylate-atropine (LOMOTIL) 2.5-0.025 MG tablet Take 2 tablets by mouth 4 (four) times daily as needed for diarrhea or loose stools. 05/15/16   Billy Fischer, MD  ibuprofen (ADVIL,MOTRIN) 800 MG tablet Take 800 mg by mouth every 8 (eight) hours as needed for moderate pain. 01/15/14   Dalia Heading, PA-C  loratadine (CLARITIN) 10 MG tablet Take 10 mg by mouth daily.    Historical Provider, MD  methocarbamol (ROBAXIN) 500 MG tablet Take 1 tablet (500 mg total) by mouth 2 (two) times daily. 03/15/16   Nona Dell, PA-C  naproxen (NAPROSYN) 500 MG tablet Take 1 tablet (500 mg total) by mouth 2 (two) times daily. 03/15/16   Nona Dell, PA-C  predniSONE (DELTASONE) 20 MG tablet Take 3 tabs po on first day, 2 tabs second day, 2 tabs third day, 1 tab fourth day, 1 tab 5th day. Take with food. 01/23/16   Janne Napoleon, NP    Family History No family history on file.  Social History Social History  Substance Use Topics  . Smoking status: Never Smoker  . Smokeless tobacco: Never Used  . Alcohol use No     Allergies   Review of patient's allergies indicates no known allergies.   Review of Systems Review of Systems  Constitutional: Negative for chills, diaphoresis and fever.  Respiratory: Positive for shortness of breath ("a little"). Negative for cough, hemoptysis and wheezing.   Cardiovascular: Positive for chest pain. Negative for palpitations, orthopnea, claudication and leg swelling.  Gastrointestinal: Negative for abdominal pain, constipation, diarrhea, nausea and vomiting.  Genitourinary: Negative for dysuria and hematuria.  Musculoskeletal: Negative for arthralgias and myalgias.  Skin: Negative for color change.  Allergic/Immunologic: Negative for immunocompromised state.   Neurological: Negative for dizziness, weakness, light-headedness and numbness.  Psychiatric/Behavioral: Negative for confusion.   10 Systems reviewed and are negative for acute change except as noted in the HPI.   Physical Exam Updated Vital Signs BP 114/62 (BP Location: Right Arm)   Pulse 76   Temp 97.8 F (36.6 C) (Oral)   Resp 24   SpO2 100%   Physical Exam  Constitutional: She is oriented to person, place, and time. Vital signs are normal. She appears well-developed and well-nourished.  Non-toxic appearance. No distress.  Afebrile, nontoxic, NAD  HENT:  Head: Normocephalic and atraumatic.  Mouth/Throat: Oropharynx is clear and moist and mucous membranes are normal.  Eyes: Conjunctivae and EOM are normal. Right eye exhibits no discharge. Left eye exhibits no discharge.  Neck: Normal range of motion. Neck supple.  Cardiovascular: Normal rate, regular rhythm, normal heart sounds and intact distal pulses.  Exam reveals no gallop and no friction rub.   No murmur heard. RRR, nl s1/s2, no m/r/g, distal pulses intact, no pedal edema   Pulmonary/Chest: Effort normal and breath sounds normal. No respiratory distress. She has no decreased breath sounds. She has  no wheezes. She has no rhonchi. She has no rales. She exhibits tenderness. She exhibits no crepitus, no deformity and no retraction.    CTAB in all lung fields, no w/r/r, no hypoxia or increased WOB, speaking in full sentences, SpO2 100% on RA Chest wall with mild TTP over L anterior chest wall extending into epigastric area, without crepitus, deformities, or retractions   Abdominal: Soft. Normal appearance and bowel sounds are normal. She exhibits no distension. There is tenderness in the right upper quadrant and epigastric area. There is no rigidity, no rebound, no guarding, no CVA tenderness, no tenderness at McBurney's point and negative Murphy's sign.    Soft, obese but without obvious distension, +BS throughout, with very  mild epigastric and RUQ TTP, no r/g/r, neg murphy's, neg mcburney's, no CVA TTP   Musculoskeletal: Normal range of motion.  MAE x4 Strength and sensation grossly intact Distal pulses intact Gait steady No pedal edema, neg homan's bilaterally   Neurological: She is alert and oriented to person, place, and time. She has normal strength. No sensory deficit.  Skin: Skin is warm, dry and intact. No rash noted.  Psychiatric: She has a normal mood and affect.  Nursing note and vitals reviewed.    ED Treatments / Results  Labs (all labs ordered are listed, but only abnormal results are displayed) Labs Reviewed  COMPREHENSIVE METABOLIC PANEL - Abnormal; Notable for the following:       Result Value   Glucose, Bld 100 (*)    Albumin 3.2 (*)    Anion gap 4 (*)    All other components within normal limits  CBC WITH DIFFERENTIAL/PLATELET - Abnormal; Notable for the following:    Hemoglobin 11.4 (*)    HCT 35.5 (*)    MCH 25.3 (*)    All other components within normal limits  LIPASE, BLOOD  I-STAT TROPOININ, ED  I-STAT TROPOININ, ED    EKG  EKG Interpretation  Date/Time:  Saturday July 22 2016 08:11:59 EDT Ventricular Rate:  84 PR Interval:    QRS Duration: 93 QT Interval:  373 QTC Calculation: 441 R Axis:   -4 Text Interpretation:  Sinus rhythm Probable left atrial enlargement Low voltage, precordial leads Baseline wander in lead(s) V3 V5 V6 Confirmed by HAVILAND MD, JULIE ([id number]) on 07/22/2016 8:17:07 AM       Radiology Dg Chest 2 View  Result Date: 07/22/2016 CLINICAL DATA:  Left-sided chest pain and cough EXAM: CHEST  2 VIEW COMPARISON:  06/21/2015 FINDINGS: The heart size and mediastinal contours are within normal limits. Both lungs are clear. The visualized skeletal structures are unremarkable. IMPRESSION: No active cardiopulmonary disease. Electronically Signed   By: Inez Catalina M.D.   On: 07/22/2016 09:05    Procedures Procedures (including critical care  time)  Medications Ordered in ED Medications  aspirin chewable tablet 324 mg (324 mg Oral Given 07/22/16 0938)  gi cocktail (Maalox,Lidocaine,Donnatal) (30 mLs Oral Given 07/22/16 [id number])     Initial Impression / Assessment and Plan / ED Course  I have reviewed the triage vital signs and the nursing notes.  Pertinent labs & imaging results that were available during my care of the patient were reviewed by me and considered in my medical decision making (see chart for details).  Clinical Course    45 y.o. female here with sudden onset CP that awoke her from rest, starting ~1hr PTA. Accompanied by "a little" SOB. No hypoxia or tachycardia, PERC neg, doubt PE. Clear lung exam. Reproducible pain  in L chest wall and epigastrum, neg murphy's sign although some slight TTP extending into RUQ area. Doubt need for abd U/S at this time given neg Murphy's, but if CMP shows abnormal LFTs, may consider getting this. Will get CBC w/diff, CMP, lipase, CXR, and Trop. Will give ASA and GI cocktail, will hold off on morphine and NTG for now. EKG reassuring with no acute ischemic findings. Will reassess shortly.   9:30 AM CBC w/diff with mild stable anemia and otherwise WNL. CMP unremarkable. Lipase WNL. CXR neg. Trop neg at ~2hrs post onset. Pt has not yet been given her medications, will have these given now. HEART score 2, doubt need for admission for ACS rule out, but will delta trop at 11:45am (~5hrs post onset). Will reassess shortly, after meds given and delta trop completed.   10:09 AM Pt given meds, doesn't feel much different/change after meds but denies ongoing symptoms at this time. Declines wanting anything else for symptoms. Will proceed with delta trop at 11:45am then reassess.  11:45 AM  Pt feeling improved. Delta trop neg, which is reassuring, doubt need for further emergent work up for her chest pain/SOB complaints. Symptoms most likely related to indigestion, vs possible reflex tachycardia  causing chest pain due to the clonidine use (unclear if she's taking it for HTN or for hot flashes). Discussed continuing prilosec for indigestion/GERD/PUD, discussed diet modifications for this as well. Tylenol PRN pain. Tums/Maalox/Zantac as needed for additional relief of symptoms. Stay well hydrated. F/up with PCP in 5-7 days for recheck of symptoms. Continues to feel improved without persistent recurrent symptoms, stable for discharge. I explained the diagnosis and have given explicit precautions to return to the ER including for any other new or worsening symptoms. The patient understands and accepts the medical plan as it's been dictated and I have answered their questions. Discharge instructions concerning home care and prescriptions have been given. The patient is STABLE and is discharged to home in good condition.   Final Clinical Impressions(s) / ED Diagnoses   Final diagnoses:  Atypical chest pain  Epigastric pain  Indigestion  Gastritis without bleeding, unspecified chronicity, unspecified gastritis type    New Prescriptions New Prescriptions   No medications on file     Zacarias Pontes, PA-C 07/22/16 1150    Isla Pence, MD 07/22/16 1349

## 2016-07-22 NOTE — ED Notes (Signed)
Pt taken to xray 

## 2016-07-22 NOTE — ED Triage Notes (Signed)
Pt in from home with L sided CP since waking at 0700. Pain described as gnawing, dull and 6/10. Denies any sob, n/v, diaphoresis or dizziness with pain. Alert, VSS,. States she recently started new medications, Clonidine and Prilosec 10/17.

## 2016-07-27 ENCOUNTER — Other Ambulatory Visit: Payer: Self-pay | Admitting: Internal Medicine

## 2016-07-27 DIAGNOSIS — N951 Menopausal and female climacteric states: Secondary | ICD-10-CM

## 2016-08-04 ENCOUNTER — Ambulatory Visit
Admission: RE | Admit: 2016-08-04 | Discharge: 2016-08-04 | Disposition: A | Discharge status: Home / Self Care | Payer: Medicaid Other | Source: Ambulatory Visit | Attending: Internal Medicine | Admitting: Internal Medicine

## 2016-08-04 DIAGNOSIS — N951 Menopausal and female climacteric states: Secondary | ICD-10-CM

## 2016-09-05 ENCOUNTER — Ambulatory Visit (HOSPITAL_COMMUNITY)
Admission: EM | Admit: 2016-09-05 | Discharge: 2016-09-05 | Disposition: A | Discharge status: Home / Self Care | Payer: Medicaid Other | Attending: Family Medicine | Admitting: Family Medicine

## 2016-09-05 ENCOUNTER — Encounter (HOSPITAL_COMMUNITY): Payer: Self-pay | Admitting: Family Medicine

## 2016-09-05 ENCOUNTER — Ambulatory Visit (INDEPENDENT_AMBULATORY_CARE_PROVIDER_SITE_OTHER): Payer: Medicaid Other

## 2016-09-05 DIAGNOSIS — J111 Influenza due to unidentified influenza virus with other respiratory manifestations: Secondary | ICD-10-CM | POA: Diagnosis not present

## 2016-09-05 DIAGNOSIS — R05 Cough: Secondary | ICD-10-CM

## 2016-09-05 DIAGNOSIS — R059 Cough, unspecified: Secondary | ICD-10-CM

## 2016-09-05 NOTE — ED Provider Notes (Signed)
CSN: [id number]     Arrival date & time 09/05/16  1646 History   None    Chief Complaint  Patient presents with  . Cough  . Fever   HPI  [name] is a 45 yo female with PMH of asthma and allergic rhinitis and anxiety who presents with fever, chest congestion, and body aches. Reports of chest congestion and body aches since Friday. She started having fevers/chills today with fever at home up to 102F. She also reports of a mildly productive cough which began on Sunday. Sore throat since Saturday. Notes of sore throat since Saturday. He right ear seemed to feel clogged starting yesterday. Notes of no appetite. Reports of some nausea last night but no emesis. No diarrhea. Has been using her allergy medications as prescribed: Claritin and Flonase. She has a history of asthma and does take a controller medication twice a day (unsure of the name of the medication). She started using albuterol 2 days ago when her chest was feeling tight. Used once this morning and twice yesterday. Has not had a flu vaccine this season. No sick contacts. Has been using lozenges for her sore throat. Her temples hurt when she coughs. No nuchal rigidity.    Past Medical History:  Diagnosis Date  . Asthma   . Fibroids   . OAB (overactive bladder)    Past Surgical History:  Procedure Laterality Date  . ABDOMINAL HYSTERECTOMY  02/05/2006   partial  . [place], 2002, 2005,    x 3   History reviewed. No pertinent family history. Social History  Substance Use Topics  . Smoking status: Never Smoker  . Smokeless tobacco: Never Used  . Alcohol use No   OB History    Gravida Para Term Preterm AB Living   1 1 1     1    SAB TAB Ectopic Multiple Live Births                 Review of Systems: as noted above.   Allergies  Patient has no known allergies.  Home Medications   Prior to Admission medications   Medication Sig Start Date End Date Taking? Authorizing Provider  albuterol (PROVENTIL HFA;VENTOLIN  HFA) 108 (90 BASE) MCG/ACT inhaler Inhale 1 puff into the lungs daily.     Historical Provider, MD  albuterol (PROVENTIL) (2.5 MG/3ML) 0.083% nebulizer solution Take 2.5 mg by nebulization every 6 (six) hours as needed for wheezing or shortness of breath.    Historical Provider, MD  cloNIDine (CATAPRES) 0.1 MG tablet Take 0.1 mg by mouth daily.    Historical Provider, MD  cyclobenzaprine (FLEXERIL) 10 MG tablet Take 1 tablet (10 mg total) by mouth 2 (two) times daily as needed for muscle spasms. Patient not taking: Reported on 07/22/2016 09/28/15   Samantha Tripp Dowless, PA-C  diphenoxylate-atropine (LOMOTIL) 2.5-0.025 MG tablet Take 2 tablets by mouth 4 (four) times daily as needed for diarrhea or loose stools. Patient not taking: Reported on 07/22/2016 05/15/16   Billy Fischer, MD  ibuprofen (ADVIL,MOTRIN) 800 MG tablet Take 800 mg by mouth every 8 (eight) hours as needed for moderate pain. 01/15/14   Dalia Heading, PA-C  loratadine (CLARITIN) 10 MG tablet Take 10 mg by mouth daily.    Historical Provider, MD  methocarbamol (ROBAXIN) 500 MG tablet Take 1 tablet (500 mg total) by mouth 2 (two) times daily. Patient not taking: Reported on 07/22/2016 03/15/16   Nona Dell, PA-C  naproxen (NAPROSYN) 500  MG tablet Take 1 tablet (500 mg total) by mouth 2 (two) times daily. Patient not taking: Reported on 07/22/2016 03/15/16   Nona Dell, PA-C  omeprazole (PRILOSEC) 20 MG capsule Take 20 mg by mouth 2 (two) times daily before a meal.    Historical Provider, MD  predniSONE (DELTASONE) 20 MG tablet Take 3 tabs po on first day, 2 tabs second day, 2 tabs third day, 1 tab fourth day, 1 tab 5th day. Take with food. 01/23/16   Janne Napoleon, NP   Meds Ordered and Administered this Visit  Medications - No data to display  BP 156/96   Pulse 88   Temp 100 F (37.8 C)   Resp 20   SpO2 96%  No data found.   Physical Exam  Constitutional: She is oriented to person, place, and time.  She appears well-developed and well-nourished.  Fatigued appearing  HENT:  Right Ear: External ear normal.  Left Ear: External ear normal.  Right TM with mild effusion but no sign of infection. Left TM with mild effusion but no sign of infection.   Eyes: Conjunctivae and EOM are normal. Pupils are equal, round, and reactive to light. Right eye exhibits no discharge. Left eye exhibits no discharge. No scleral icterus.  Neck: Normal range of motion. Neck supple.  Cardiovascular: Normal rate and regular rhythm.  Exam reveals no gallop and no friction rub.   No murmur heard. Pulmonary/Chest: Effort normal and breath sounds normal. No respiratory distress. She has no wheezes. She has no rales.  Abdominal: Soft. Bowel sounds are normal.  Lymphadenopathy:    She has no cervical adenopathy.  Neurological: She is alert and oriented to person, place, and time.  Skin: Skin is warm and dry. Capillary refill takes less than 2 seconds.  Psychiatric: She has a normal mood and affect.    Urgent Care Course   Clinical Course    Procedures  Labs Review Labs Reviewed - No data to display  Imaging Review Dg Chest 2 View  Result Date: 09/05/2016 CLINICAL DATA:  Cough, chest congestion, sinus congestion, body aches, and fever for 4 days. EXAM: CHEST  2 VIEW COMPARISON:  07/22/2016 and 06/21/2015 FINDINGS: The cardiomediastinal silhouette is within normal limits. Mild interstitial coarsening is similar to the prior studies. No confluent airspace opacity, edema, pleural effusion, or pneumothorax is identified. No acute osseous abnormality is seen. IMPRESSION: No active cardiopulmonary disease. Electronically Signed   By: Logan Bores M.D.   On: 09/05/2016 17:25     MDM   1. Influenza   2. Cough    Symptoms consistent with influenza. CXR is unremarkable for acute process. Symptom onset >48 hour therefore Tamiflu is not indicated at this time. No wheezing noted on lung exam; unlikely patient has an  asthma exacerbation. Patient's vitals are stable and is stable for discharge to home. Return/ED precautions discussed with patient and her partner. Discussed symptomatic/supportive therapy including hydration, Tylenol/Ibuprofen PRN.   Patient seen and examined with Dr. Juventino Slovak. Assessment and plan discussed with Dr. Juventino Slovak.   Smiley Houseman, MD PGY 2 Family Medicine.     Smiley Houseman, MD 09/05/16 [phone]

## 2016-09-05 NOTE — Discharge Instructions (Signed)
We believe your symptoms care consistent with the flu. You can use Tylenol and or ibuprofen for your fever and muscle aches. Please seek immediate medical attention if you worsening shortness of breath or you are unable to stay hydrated at home.

## 2016-09-05 NOTE — ED Triage Notes (Signed)
Pt here for cough, fever, congestion and SOB. sts she has been using her inhaler at home.

## 2016-10-20 ENCOUNTER — Ambulatory Visit (INDEPENDENT_AMBULATORY_CARE_PROVIDER_SITE_OTHER): Payer: Medicaid Other

## 2016-10-20 ENCOUNTER — Encounter (HOSPITAL_COMMUNITY): Payer: Self-pay | Admitting: *Deleted

## 2016-10-20 ENCOUNTER — Ambulatory Visit (HOSPITAL_COMMUNITY)
Admission: EM | Admit: 2016-10-20 | Discharge: 2016-10-20 | Disposition: A | Discharge status: Home / Self Care | Payer: Medicaid Other | Attending: Family Medicine | Admitting: Family Medicine

## 2016-10-20 DIAGNOSIS — S93401A Sprain of unspecified ligament of right ankle, initial encounter: Secondary | ICD-10-CM

## 2016-10-20 MED ORDER — NAPROXEN 500 MG PO TABS: 500.0000 mg | ORAL_TABLET | Freq: Two times a day (BID) | ORAL | 0 refills | Status: DC

## 2016-10-20 NOTE — ED Provider Notes (Signed)
CSN: [id number]     Arrival date & time 10/20/16  1127 History   First MD Initiated Contact with Patient 10/20/16 1341     Chief Complaint  Patient presents with  . Foot Injury   (Consider location/radiation/quality/duration/timing/severity/associated sxs/prior Treatment) Patient c/o right foot pain for 2 days after she slipped and injured her right foot and now she is having difficulty walking and bearing weight right foot.   The history is provided by the patient.  Foot Injury  Location:  Foot Time since incident:  2 days Injury: no   Foot location:  R foot Pain details:    Quality:  Aching   Radiates to:  Does not radiate   Severity:  Moderate   Duration:  2 days   Timing:  Constant Chronicity:  New Dislocation: no   Tetanus status:  Unknown Relieved by:  Nothing Worsened by:  Nothing Ineffective treatments:  None tried   Past Medical History:  Diagnosis Date  . Asthma   . Fibroids   . OAB (overactive bladder)    Past Surgical History:  Procedure Laterality Date  . ABDOMINAL HYSTERECTOMY  02/05/2006   partial  . [place], 2002, 2005,    x 3   History reviewed. No pertinent family history. Social History  Substance Use Topics  . Smoking status: Never Smoker  . Smokeless tobacco: Never Used  . Alcohol use No   OB History    Gravida Para Term Preterm AB Living   1 1 1     1    SAB TAB Ectopic Multiple Live Births                 Review of Systems  Constitutional: Negative.   HENT: Negative.   Eyes: Negative.   Respiratory: Negative.   Cardiovascular: Negative.   Gastrointestinal: Negative.   Endocrine: Negative.   Genitourinary: Negative.   Allergic/Immunologic: Negative.   Neurological: Negative.     Allergies  Patient has no known allergies.  Home Medications   Prior to Admission medications   Medication Sig Start Date End Date Taking? Authorizing Provider  albuterol (PROVENTIL HFA;VENTOLIN HFA) 108 (90 BASE) MCG/ACT inhaler  Inhale 1 puff into the lungs daily.     Historical Provider, MD  albuterol (PROVENTIL) (2.5 MG/3ML) 0.083% nebulizer solution Take 2.5 mg by nebulization every 6 (six) hours as needed for wheezing or shortness of breath.    Historical Provider, MD  cloNIDine (CATAPRES) 0.1 MG tablet Take 0.1 mg by mouth daily.    Historical Provider, MD  cyclobenzaprine (FLEXERIL) 10 MG tablet Take 1 tablet (10 mg total) by mouth 2 (two) times daily as needed for muscle spasms. Patient not taking: Reported on 07/22/2016 09/28/15   Samantha Tripp Dowless, PA-C  diphenoxylate-atropine (LOMOTIL) 2.5-0.025 MG tablet Take 2 tablets by mouth 4 (four) times daily as needed for diarrhea or loose stools. Patient not taking: Reported on 07/22/2016 05/15/16   Billy Fischer, MD  ibuprofen (ADVIL,MOTRIN) 800 MG tablet Take 800 mg by mouth every 8 (eight) hours as needed for moderate pain. 01/15/14   Dalia Heading, PA-C  loratadine (CLARITIN) 10 MG tablet Take 10 mg by mouth daily.    Historical Provider, MD  methocarbamol (ROBAXIN) 500 MG tablet Take 1 tablet (500 mg total) by mouth 2 (two) times daily. Patient not taking: Reported on 07/22/2016 03/15/16   Nona Dell, PA-C  naproxen (NAPROSYN) 500 MG tablet Take 1 tablet (500 mg total) by mouth 2 (two) times  daily. Patient not taking: Reported on 07/22/2016 03/15/16   Nona Dell, PA-C  naproxen (NAPROSYN) 500 MG tablet Take 1 tablet (500 mg total) by mouth 2 (two) times daily with a meal. 10/20/16   Lysbeth Penner, FNP  omeprazole (PRILOSEC) 20 MG capsule Take 20 mg by mouth 2 (two) times daily before a meal.    Historical Provider, MD  predniSONE (DELTASONE) 20 MG tablet Take 3 tabs po on first day, 2 tabs second day, 2 tabs third day, 1 tab fourth day, 1 tab 5th day. Take with food. 01/23/16   Janne Napoleon, NP   Meds Ordered and Administered this Visit  Medications - No data to display  BP 153/86 (BP Location: Right Arm)   Pulse 82   Temp 98.5 F  (36.9 C) (Oral)   Resp 14   SpO2 100%  No data found.   Physical Exam  Constitutional: She appears well-developed and well-nourished.  HENT:  Head: Normocephalic and atraumatic.  Eyes: EOM are normal. Pupils are equal, round, and reactive to light.  Neck: Normal range of motion. Neck supple.  Cardiovascular: Normal rate, regular rhythm and normal heart sounds.   Pulmonary/Chest: Effort normal and breath sounds normal.  Musculoskeletal: She exhibits tenderness.  TTP right malleolus.  Right malleolus swelling.  Nursing note and vitals reviewed.   Urgent Care Course     Procedures (including critical care time)  Labs Review Labs Reviewed - No data to display  Imaging Review Dg Ankle Complete Right  Result Date: 10/20/2016 CLINICAL DATA:  Pain following fall EXAM: RIGHT ANKLE - COMPLETE 3+ VIEW COMPARISON:  None. FINDINGS: Frontal, oblique, and lateral views were obtained. There is soft tissue swelling. There is no demonstrable fracture or joint effusion. There is no appreciable joint space narrowing in the ankle region. There are prominent posterior and inferior calcaneal spurs. There is slight spurring in the dorsal talonavicular joint. IMPRESSION: Soft tissue swelling. No fracture. Ankle mortise appears intact. There are prominent calcaneal spurs. Electronically Signed   By: Lowella Grip III M.D.   On: 10/20/2016 14:07     Visual Acuity Review  Right Eye Distance:   Left Eye Distance:   Bilateral Distance:    Right Eye Near:   Left Eye Near:    Bilateral Near:         MDM   1. Sprain of right ankle, unspecified ligament, initial encounter    Cam walker right foot Naprosyn 500mg  one po bid x 10 days #20      Lysbeth Penner, FNP 10/20/16 1415

## 2016-10-20 NOTE — ED Triage Notes (Signed)
Pt  Reports   She  Injured  Her   Foot  2  Days     She  Put   Weight  On  r foot       While getting   Something  From  A  Cabinet  Pain is  Worse  On  Movement  And  Weight  Bearing

## 2016-11-09 ENCOUNTER — Other Ambulatory Visit: Payer: Self-pay | Admitting: Internal Medicine

## 2016-11-09 DIAGNOSIS — Z1231 Encounter for screening mammogram for malignant neoplasm of breast: Secondary | ICD-10-CM

## 2016-11-22 ENCOUNTER — Ambulatory Visit
Admission: RE | Admit: 2016-11-22 | Discharge: 2016-11-22 | Disposition: A | Discharge status: Home / Self Care | Payer: Medicaid Other | Source: Ambulatory Visit | Attending: Internal Medicine | Admitting: Internal Medicine

## 2016-11-22 DIAGNOSIS — Z1231 Encounter for screening mammogram for malignant neoplasm of breast: Secondary | ICD-10-CM

## 2016-12-21 ENCOUNTER — Encounter: Payer: Self-pay | Admitting: Obstetrics & Gynecology

## 2016-12-21 ENCOUNTER — Ambulatory Visit (INDEPENDENT_AMBULATORY_CARE_PROVIDER_SITE_OTHER): Payer: Medicaid Other | Admitting: Obstetrics & Gynecology

## 2016-12-21 VITALS — BP 154/92 | HR 79 | Ht 66.0 in | Wt 244.0 lb

## 2016-12-21 DIAGNOSIS — Z01419 Encounter for gynecological examination (general) (routine) without abnormal findings: Secondary | ICD-10-CM

## 2016-12-21 DIAGNOSIS — N951 Menopausal and female climacteric states: Secondary | ICD-10-CM

## 2016-12-21 DIAGNOSIS — Z0000 Encounter for general adult medical examination without abnormal findings: Secondary | ICD-10-CM | POA: Diagnosis not present

## 2016-12-21 NOTE — Progress Notes (Signed)
Pt states some lower pelvic discomfort, mainly on left side. Pt had mammogram in Feb 2018. Pt states that she had partial hysterectomy in 2007 due to Fibroids.

## 2016-12-21 NOTE — Progress Notes (Signed)
Patient ID: Joyce Welch, female   DOB: 1971/03/22, 46 y.o.   MRN: [id number]  Chief Complaint  Patient presents with  . Gynecologic Exam    HPI Joyce Welch is a 46 y.o. female.  [id number] s/p TAH 2007 for fibroid uterus. She had a mammogram 11/2016.  She notes LLQ pain only when she sneezes HPI  Past Medical History:  Diagnosis Date  . Asthma   . Fibroids   . OAB (overactive bladder)     Past Surgical History:  Procedure Laterality Date  . ABDOMINAL HYSTERECTOMY  02/05/2006   partial  . CESAREAN SECTION  1994, 2002, 2005,    x 3    Family History  Problem Relation Age of Onset  . Breast cancer Maternal Aunt     Social History Social History  Substance Use Topics  . Smoking status: Never Smoker  . Smokeless tobacco: Never Used  . Alcohol use No    No Known Allergies  Current Outpatient Prescriptions  Medication Sig Dispense Refill  . albuterol (PROVENTIL HFA;VENTOLIN HFA) 108 (90 BASE) MCG/ACT inhaler Inhale 1 puff into the lungs daily.     Marland Kitchen albuterol (PROVENTIL) (2.5 MG/3ML) 0.083% nebulizer solution Take 2.5 mg by nebulization every 6 (six) hours as needed for wheezing or shortness of breath.    . calcium carbonate (TUMS - DOSED IN MG ELEMENTAL CALCIUM) 500 MG chewable tablet Chew 1 tablet by mouth daily.    . fluticasone (FLONASE) 50 MCG/ACT nasal spray Place into both nostrils daily.    Marland Kitchen loratadine (CLARITIN) 10 MG tablet Take 10 mg by mouth daily.    . predniSONE (DELTASONE) 20 MG tablet Take 3 tabs po on first day, 2 tabs second day, 2 tabs third day, 1 tab fourth day, 1 tab 5th day. Take with food. 9 tablet 0  . ibuprofen (ADVIL,MOTRIN) 800 MG tablet Take 800 mg by mouth every 8 (eight) hours as needed for moderate pain.    . methocarbamol (ROBAXIN) 500 MG tablet Take 1 tablet (500 mg total) by mouth 2 (two) times daily. (Patient not taking: Reported on 07/22/2016) 20 tablet 0  . naproxen (NAPROSYN) 500 MG tablet Take 1 tablet (500 mg total) by mouth 2 (two) times  daily with a meal. (Patient not taking: Reported on 12/21/2016) 20 tablet 0   No current facility-administered medications for this visit.     Review of Systems Review of Systems  Constitutional: Negative.   Gastrointestinal: Positive for abdominal pain (LLQ when she sneezes only).  Endocrine:       Hot flushes  Genitourinary: Negative for dysuria, pelvic pain, vaginal bleeding and vaginal discharge.    Blood pressure (!) 154/92, pulse 79, height 5\' 6"  (1.676 m), weight 244 lb (110.7 kg).  Physical Exam Physical Exam  Constitutional: She appears well-developed. She appears distressed.  Cardiovascular: Normal rate.   Pulmonary/Chest: Effort normal. No respiratory distress.  Breasts: breasts appear normal, no suspicious masses, no skin or nipple changes or axillary nodes.   Genitourinary: Vaginal discharge found.  Genitourinary Comments: No pelvic mass or tenderness, cuff intact  Psychiatric: She has a normal mood and affect. Her behavior is normal.    Data Reviewed Vital signs  Assessment    Well woman exam Obesity Hypertension Suspect MS LLQ pain not pelvic in origin    Plan    Well woman exam and yearly mammogram Pap smears not indicated       Joyce Welch 12/21/2016, 9:20 AM

## 2016-12-21 NOTE — Patient Instructions (Signed)
Mammogram A mammogram is an X-ray of the breasts that is done to check for changes that are not normal. This test can screen for and find any changes that may suggest breast cancer. This test can also help to find other changes and variations in the breast. What happens before the procedure?  Have this test done about 1-2 weeks after your period. This is usually when your breasts are the least tender.  If you are visiting a new doctor or clinic, send any past mammogram images to your new doctor's office.  Wash your breasts and under your arms the day of the test.  Do not use deodorants, perfumes, lotions, or powders on the day of the test.  Take off any jewelry from your neck.  Wear clothes that you can change into and out of easily. What happens during the procedure?  You will undress from the waist up. You will put on a gown.  You will stand in front of the X-ray machine.  Each breast will be placed between two plastic or glass plates. The plates will press down on your breast for a few seconds. Try to stay as relaxed as possible. This does not cause any harm to your breasts. Any discomfort you feel will be very brief.  X-rays will be taken from different angles of each breast. The procedure may vary among doctors and hospitals. What happens after the procedure?  The mammogram will be looked at by a specialist (radiologist).  You may need to do certain parts of the test again. This depends on the quality of the images.  Ask when your test results will be ready. Make sure you get your test results.  You may go back to your normal activities. This information is not intended to replace advice given to you by your health care provider. Make sure you discuss any questions you have with your health care provider. Document Released: 12/15/2008 Document Revised: 02/24/2016 Document Reviewed: 11/27/2014 Elsevier Interactive Patient Education  2017 [employer].

## 2017-04-14 ENCOUNTER — Ambulatory Visit (HOSPITAL_COMMUNITY)
Admission: EM | Admit: 2017-04-14 | Discharge: 2017-04-14 | Disposition: A | Discharge status: Home / Self Care | Payer: Medicaid Other | Attending: Internal Medicine | Admitting: Internal Medicine

## 2017-04-14 ENCOUNTER — Encounter (HOSPITAL_COMMUNITY): Payer: Self-pay | Admitting: Emergency Medicine

## 2017-04-14 DIAGNOSIS — R350 Frequency of micturition: Secondary | ICD-10-CM | POA: Diagnosis not present

## 2017-04-14 DIAGNOSIS — R3915 Urgency of urination: Secondary | ICD-10-CM

## 2017-04-14 DIAGNOSIS — N3281 Overactive bladder: Secondary | ICD-10-CM

## 2017-04-14 LAB — POCT URINALYSIS DIP (DEVICE)
Bilirubin Urine: NEGATIVE
Glucose, UA: NEGATIVE mg/dL
Ketones, ur: NEGATIVE mg/dL
Leukocytes, UA: NEGATIVE
Nitrite: NEGATIVE
Protein, ur: NEGATIVE mg/dL
Specific Gravity, Urine: 1.025 (ref 1.005–1.030)
Urobilinogen, UA: 0.2 mg/dL (ref 0.0–1.0)
pH: 6 (ref 5.0–8.0)

## 2017-04-14 MED ORDER — OXYBUTYNIN CHLORIDE 5 MG PO TABS: 5.0000 mg | ORAL_TABLET | Freq: Three times a day (TID) | ORAL | 0 refills | Status: DC

## 2017-04-14 NOTE — ED Triage Notes (Signed)
Pt here for UTI sx onset 1 week associated w/abd and back pain, dizziness, urinary freq/urgency, chills  Denies dysuria, hematuria, fevers, n/v  Saw PCP yest and was treated for muscle strain.   A&O x4... NAD... Ambulatory

## 2017-04-14 NOTE — Discharge Instructions (Signed)
Read instructions on overactive bladder. Take the medications as directed. One of the side effects is dry mouth. Sometimes sleepiness. Call your doctor Monday for an appointment and follow-up.

## 2017-04-14 NOTE — ED Provider Notes (Signed)
CSN: [id number]     Arrival date & time 04/14/17  1200 History   First MD Initiated Contact with Patient 04/14/17 1228     Chief Complaint  Patient presents with  . Urinary Tract Infection   (Consider location/radiation/quality/duration/timing/severity/associated sxs/prior Treatment) 46 year old obese female complaining of urinary frequency and urgency for about a week. Denies dysuria. Denies fever. Complains of pain across the midabdomen. She has a history of constipation. Surgical history includes abdominal hysterectomy and overactive bladder.      Past Medical History:  Diagnosis Date  . Asthma   . Fibroids   . OAB (overactive bladder)    Past Surgical History:  Procedure Laterality Date  . ABDOMINAL HYSTERECTOMY  02/05/2006   partial  . CESAREAN SECTION  1994, 2002, 2005,    x 3   Family History  Problem Relation Age of Onset  . Breast cancer Maternal Aunt    Social History  Substance Use Topics  . Smoking status: Never Smoker  . Smokeless tobacco: Never Used  . Alcohol use No   OB History    Gravida Para Term Preterm AB Living   3 3 1     3    SAB TAB Ectopic Multiple Live Births           3     Review of Systems  Constitutional: Negative for appetite change and fever.  HENT: Negative.   Respiratory: Negative.   Gastrointestinal: Positive for abdominal pain and constipation. Negative for blood in stool, nausea and vomiting.  Genitourinary: Positive for frequency and urgency. Negative for dysuria.  Musculoskeletal: Negative.   All other systems reviewed and are negative.   Allergies  Patient has no known allergies.  Home Medications   Prior to Admission medications   Medication Sig Start Date End Date Taking? Authorizing Provider  albuterol (PROVENTIL HFA;VENTOLIN HFA) 108 (90 BASE) MCG/ACT inhaler Inhale 1 puff into the lungs daily.    Yes [provider]  fluticasone (FLONASE) 50 MCG/ACT nasal spray Place into both nostrils daily.   Yes  [provider]  loratadine (CLARITIN) 10 MG tablet Take 10 mg by mouth daily.   Yes [provider]  albuterol (PROVENTIL) (2.5 MG/3ML) 0.083% nebulizer solution Take 2.5 mg by nebulization every 6 (six) hours as needed for wheezing or shortness of breath.    [provider]  calcium carbonate (TUMS - DOSED IN MG ELEMENTAL CALCIUM) 500 MG chewable tablet Chew 1 tablet by mouth daily.    [provider]  ibuprofen (ADVIL,MOTRIN) 800 MG tablet Take 800 mg by mouth every 8 (eight) hours as needed for moderate pain. 01/15/14   Lawyer, Harrell Gave, PA-C  oxybutynin (DITROPAN) 5 MG tablet Take 1 tablet (5 mg total) by mouth 3 (three) times daily. 04/14/17   Janne Napoleon, NP  predniSONE (DELTASONE) 20 MG tablet Take 3 tabs po on first day, 2 tabs second day, 2 tabs third day, 1 tab fourth day, 1 tab 5th day. Take with food. 01/23/16   Janne Napoleon, NP   Meds Ordered and Administered this Visit  Medications - No data to display  BP 127/83 (BP Location: Left Arm)   Pulse 96   Temp 98.3 F (36.8 C) (Oral)   Resp 16   SpO2 100%  No data found.   Physical Exam  Constitutional: She appears well-developed and well-nourished. No distress.  Eyes: EOM are normal.  Neck: Neck supple.  Cardiovascular: Normal rate and regular rhythm.   Pulmonary/Chest: Effort normal and breath  sounds normal.  Abdominal: Soft.  Periumbilical and bilateral anterior abdominal tenderness. No rebound or guarding.  Musculoskeletal: Normal range of motion.  Neurological: She is alert.  Skin: Skin is warm and dry.  Psychiatric: She has a normal mood and affect.  Nursing note and vitals reviewed.   Urgent Care Course     Procedures (including critical care time)  Labs Review Labs Reviewed  POCT URINALYSIS DIP (DEVICE) - Abnormal; Notable for the following:       Result Value   Hgb urine dipstick TRACE (*)    All other components within normal limits    Imaging Review No results  found.   Visual Acuity Review  Right Eye Distance:   Left Eye Distance:   Bilateral Distance:    Right Eye Near:   Left Eye Near:    Bilateral Near:         MDM   1. OAB (overactive bladder)   2. Urinary frequency    Read instructions on overactive bladder. Take the medications as directed. One of the side effects is dry mouth. Sometimes sleepiness. Call your doctor Monday for an appointment and follow-up. Meds ordered this encounter  Medications  . oxybutynin (DITROPAN) 5 MG tablet    Sig: Take 1 tablet (5 mg total) by mouth 3 (three) times daily.    Dispense:  45 tablet    Refill:  0    Order Specific Question:   Supervising Provider    Answer:   Sherlene Shams [[id number]]       Janne Napoleon, NP 04/14/17 1323

## 2017-05-06 ENCOUNTER — Encounter (HOSPITAL_COMMUNITY): Payer: Self-pay | Admitting: Nurse Practitioner

## 2017-05-06 ENCOUNTER — Emergency Department (HOSPITAL_COMMUNITY)
Admission: EM | Admit: 2017-05-06 | Discharge: 2017-05-06 | Disposition: A | Discharge status: Home / Self Care | Payer: Medicaid Other | Attending: Emergency Medicine | Admitting: Emergency Medicine

## 2017-05-06 DIAGNOSIS — Z79899 Other long term (current) drug therapy: Secondary | ICD-10-CM | POA: Diagnosis not present

## 2017-05-06 DIAGNOSIS — S39012A Strain of muscle, fascia and tendon of lower back, initial encounter: Secondary | ICD-10-CM | POA: Insufficient documentation

## 2017-05-06 DIAGNOSIS — X58XXXA Exposure to other specified factors, initial encounter: Secondary | ICD-10-CM | POA: Diagnosis not present

## 2017-05-06 DIAGNOSIS — Y929 Unspecified place or not applicable: Secondary | ICD-10-CM | POA: Diagnosis not present

## 2017-05-06 DIAGNOSIS — Y939 Activity, unspecified: Secondary | ICD-10-CM | POA: Diagnosis not present

## 2017-05-06 DIAGNOSIS — J45909 Unspecified asthma, uncomplicated: Secondary | ICD-10-CM | POA: Diagnosis not present

## 2017-05-06 DIAGNOSIS — Y999 Unspecified external cause status: Secondary | ICD-10-CM | POA: Insufficient documentation

## 2017-05-06 DIAGNOSIS — S3992XA Unspecified injury of lower back, initial encounter: Secondary | ICD-10-CM | POA: Diagnosis present

## 2017-05-06 MED ORDER — NAPROXEN 500 MG PO TABS: 500.0000 mg | ORAL_TABLET | Freq: Two times a day (BID) | ORAL | 0 refills | Status: DC

## 2017-05-06 MED ORDER — KETOROLAC TROMETHAMINE 60 MG/2ML IM SOLN
60.0000 mg | Freq: Once | INTRAMUSCULAR | Status: AC
  Administered 2017-05-06: 60 mg via INTRAMUSCULAR
  Filled 2017-05-06: qty 2

## 2017-05-06 MED ORDER — CYCLOBENZAPRINE HCL 10 MG PO TABS: 5.0000 mg | ORAL_TABLET | Freq: Two times a day (BID) | ORAL | 0 refills | Status: DC | PRN

## 2017-05-06 MED ORDER — CYCLOBENZAPRINE HCL 10 MG PO TABS
10.0000 mg | ORAL_TABLET | Freq: Once | ORAL | Status: AC
  Administered 2017-05-06: 10 mg via ORAL
  Filled 2017-05-06: qty 1

## 2017-05-06 MED ORDER — PREDNISONE 10 MG PO TABS: 40.0000 mg | ORAL_TABLET | Freq: Every day | ORAL | 0 refills | Status: DC

## 2017-05-06 NOTE — ED Provider Notes (Signed)
[facility] DEPT Provider Note   CSN: [id number] Arrival date & time: 05/06/17  1752     History   Chief Complaint Chief Complaint  Patient presents with  . Back Pain    HPI Joyce Welch is a 46 y.o. female.  The history is provided by the patient. No language interpreter was used.  Back Pain      Joyce Welch is a 46 y.o. female who presents to the Emergency Department complaining of back pain. She reports sudden onset of lower back pain, greatest in the mid and left lower back. Pain is sharp in nature and feels like a muscle spasm. It is worse with moving from sitting to standing as well as small twisting movements. She took a Robaxin, 500 mg at home with no significant change in her symptoms. She has no known injuries. She denies any chest pain, shortness of breath, abdominal pain, vomiting, dysuria or change in urine color or quality. She does have associated tingling in her left hand.  Past Medical History:  Diagnosis Date  . Asthma   . Fibroids   . OAB (overactive bladder)     Patient Active Problem List   Diagnosis Date Noted  . Strain of quadriceps tendon 02/14/2014  . Symptomatic menopausal or female climacteric states 07/31/2013    Past Surgical History:  Procedure Laterality Date  . ABDOMINAL HYSTERECTOMY  02/05/2006   partial  . [place], 2002, 2005,    x 3    OB History    Gravida Para Term Preterm AB Living   3 3 1     3    SAB TAB Ectopic Multiple Live Births           3       Home Medications    Prior to Admission medications   Medication Sig Start Date End Date Taking? Authorizing Provider  albuterol (PROVENTIL HFA;VENTOLIN HFA) 108 (90 BASE) MCG/ACT inhaler Inhale 1 puff into the lungs daily.     [provider]  albuterol (PROVENTIL) (2.5 MG/3ML) 0.083% nebulizer solution Take 2.5 mg by nebulization every 6 (six) hours as needed for wheezing or shortness of breath.    [provider]  calcium carbonate (TUMS -  DOSED IN MG ELEMENTAL CALCIUM) 500 MG chewable tablet Chew 1 tablet by mouth daily.    [provider]  cyclobenzaprine (FLEXERIL) 10 MG tablet Take 0.5-1 tablets (5-10 mg total) by mouth 2 (two) times daily as needed for muscle spasms. 05/06/17   Quintella Reichert, MD  fluticasone [facility]) 50 MCG/ACT nasal spray Place into both nostrils daily.    [provider]  loratadine (CLARITIN) 10 MG tablet Take 10 mg by mouth daily.    [provider]  naproxen (NAPROSYN) 500 MG tablet Take 1 tablet (500 mg total) by mouth 2 (two) times daily. 05/06/17   Quintella Reichert, MD  oxybutynin (DITROPAN) 5 MG tablet Take 1 tablet (5 mg total) by mouth 3 (three) times daily. 04/14/17   Janne Napoleon, NP  predniSONE (DELTASONE) 10 MG tablet Take 4 tablets (40 mg total) by mouth daily. 05/06/17   Quintella Reichert, MD    Family History Family History  Problem Relation Age of Onset  . Breast cancer Maternal Aunt     Social History Social History  Substance Use Topics  . Smoking status: Never Smoker  . Smokeless tobacco: Never Used  . Alcohol use Yes     Allergies   Patient has no known allergies.  Review of Systems Review of Systems  Musculoskeletal: Positive for back pain.  All other systems reviewed and are negative.    Physical Exam Updated Vital Signs BP 135/79   Pulse 88   Temp 98.4 F (36.9 C) (Oral)   Resp 18   SpO2 100%   Physical Exam  Constitutional: She is oriented to person, place, and time. She appears well-developed and well-nourished.  HENT:  Head: Normocephalic and atraumatic.  Cardiovascular: Normal rate and regular rhythm.   No murmur heard. Pulmonary/Chest: Effort normal and breath sounds normal. No respiratory distress.  Abdominal: Soft. There is no tenderness. There is no rebound and no guarding.  Musculoskeletal: She exhibits no edema.  Mild Tenderness over the left lower back. Low back pain is reproducible with small movements of the upper and  lower extremities. 2+ radial pulses bilaterally.  Neurological: She is alert and oriented to person, place, and time.  5 out of 5 strength in all 4 extremities with sensation to light touch intact in all 4 extremities.  Skin: Skin is warm and dry.  Psychiatric: She has a normal mood and affect. Her behavior is normal.  Nursing note and vitals reviewed.    ED Treatments / Results  Labs (all labs ordered are listed, but only abnormal results are displayed) Labs Reviewed - No data to display  EKG  EKG Interpretation None       Radiology No results found.  Procedures Procedures (including critical care time)  Medications Ordered in ED Medications  ketorolac (TORADOL) injection 60 mg (not administered)  cyclobenzaprine (FLEXERIL) tablet 10 mg (not administered)     Initial Impression / Assessment and Plan / ED Course  I have reviewed the triage vital signs and the nursing notes.  Pertinent labs & imaging results that were available during my care of the patient were reviewed by me and considered in my medical decision making (see chart for details).     Patient here for evaluation of left-sided low back pain with no reported injury. She is neurovascularly intact on examination. Presentation is not consistent with renal colic, dissection, CVA, epidural abscess, cauda equina. Counseled patient on home care for lumbar strain. Discussed outpatient follow-up and return precautions.  Final Clinical Impressions(s) / ED Diagnoses   Final diagnoses:  Strain of lumbar region, initial encounter    New Prescriptions New Prescriptions   CYCLOBENZAPRINE (FLEXERIL) 10 MG TABLET    Take 0.5-1 tablets (5-10 mg total) by mouth 2 (two) times daily as needed for muscle spasms.   NAPROXEN (NAPROSYN) 500 MG TABLET    Take 1 tablet (500 mg total) by mouth 2 (two) times daily.   PREDNISONE (DELTASONE) 10 MG TABLET    Take 4 tablets (40 mg total) by mouth daily.     Quintella Reichert,  MD 05/06/17 [id number]

## 2017-05-06 NOTE — ED Triage Notes (Signed)
Pt presents with acute lower back pain. The pain began this afternoon after grocery shopping.The pain feels like a muscle spasm. The pain is in her left lower back and radiates down her left leg. She denies fevers, chills, nausea, vomiting, urinary frequency, dysuria, constipation, diarrhea, similar pain in the past. The pain gets worse when she moves. She tried a muscle relaxer at home with no relief

## 2017-10-30 ENCOUNTER — Other Ambulatory Visit: Payer: Self-pay

## 2017-10-30 ENCOUNTER — Encounter (HOSPITAL_COMMUNITY): Payer: Self-pay | Admitting: Emergency Medicine

## 2017-10-30 ENCOUNTER — Observation Stay (HOSPITAL_COMMUNITY)
Admission: EM | Admit: 2017-10-30 | Discharge: 2017-11-01 | Disposition: A | Discharge status: Home / Self Care | Payer: Medicaid Other | Attending: Internal Medicine | Admitting: Internal Medicine

## 2017-10-30 ENCOUNTER — Emergency Department (HOSPITAL_COMMUNITY): Payer: Medicaid Other

## 2017-10-30 DIAGNOSIS — Z79899 Other long term (current) drug therapy: Secondary | ICD-10-CM | POA: Insufficient documentation

## 2017-10-30 DIAGNOSIS — J4542 Moderate persistent asthma with status asthmaticus: Secondary | ICD-10-CM | POA: Diagnosis present

## 2017-10-30 DIAGNOSIS — Z7951 Long term (current) use of inhaled steroids: Secondary | ICD-10-CM | POA: Insufficient documentation

## 2017-10-30 DIAGNOSIS — D72829 Elevated white blood cell count, unspecified: Secondary | ICD-10-CM | POA: Insufficient documentation

## 2017-10-30 DIAGNOSIS — J45901 Unspecified asthma with (acute) exacerbation: Secondary | ICD-10-CM

## 2017-10-30 LAB — I-STAT TROPONIN, ED: Troponin i, poc: 0.01 ng/mL (ref 0.00–0.08)

## 2017-10-30 LAB — BASIC METABOLIC PANEL
Anion gap: 10 (ref 5–15)
BUN: 10 mg/dL (ref 6–20)
CO2: 22 mmol/L (ref 22–32)
Calcium: 9.2 mg/dL (ref 8.9–10.3)
Chloride: 106 mmol/L (ref 101–111)
Creatinine, Ser: 0.77 mg/dL (ref 0.44–1.00)
GFR calc Af Amer: >60 mL/min (ref >60)
GFR calc non Af Amer: >60 mL/min (ref >60)
Glucose, Bld: 115 mg/dL — ABNORMAL HIGH (ref 65–99)
Potassium: 4.1 mmol/L (ref 3.5–5.1)
Sodium: 138 mmol/L (ref 135–145)

## 2017-10-30 LAB — CBC
HCT: 39 % (ref 36.0–46.0)
Hemoglobin: 12.3 g/dL (ref 12.0–15.0)
MCH: 25.7 pg — ABNORMAL LOW (ref 26.0–34.0)
MCHC: 31.5 g/dL (ref 30.0–36.0)
MCV: 81.4 fL (ref 78.0–100.0)
Platelets: 289 10*3/uL (ref 150–400)
RBC: 4.79 MIL/uL (ref 3.87–5.11)
RDW: 13.4 % (ref 11.5–15.5)
WBC: 10.8 10*3/uL — ABNORMAL HIGH (ref 4.0–10.5)

## 2017-10-30 MED ORDER — IPRATROPIUM BROMIDE 0.02 % IN SOLN
0.5000 mg | Freq: Once | RESPIRATORY_TRACT | Status: AC
  Administered 2017-10-31: 0.5 mg via RESPIRATORY_TRACT
  Filled 2017-10-30: qty 2.5

## 2017-10-30 MED ORDER — BENZONATATE 100 MG PO CAPS
100.0000 mg | ORAL_CAPSULE | Freq: Once | ORAL | Status: AC
  Administered 2017-10-31: 100 mg via ORAL
  Filled 2017-10-30: qty 1

## 2017-10-30 MED ORDER — ALBUTEROL SULFATE (2.5 MG/3ML) 0.083% IN NEBU
5.0000 mg | INHALATION_SOLUTION | Freq: Once | RESPIRATORY_TRACT | Status: AC
  Administered 2017-10-31: 5 mg via RESPIRATORY_TRACT
  Filled 2017-10-30: qty 6

## 2017-10-30 NOTE — ED Triage Notes (Signed)
Pt reports SOB, dyspnea with exertion, cough, congestion X few days. Pt states she was supposed to see PCP tomorrow but feels as if she is unable to wait.

## 2017-10-31 ENCOUNTER — Other Ambulatory Visit: Payer: Self-pay

## 2017-10-31 DIAGNOSIS — J4542 Moderate persistent asthma with status asthmaticus: Principal | ICD-10-CM

## 2017-10-31 DIAGNOSIS — D72829 Elevated white blood cell count, unspecified: Secondary | ICD-10-CM | POA: Diagnosis present

## 2017-10-31 DIAGNOSIS — J45901 Unspecified asthma with (acute) exacerbation: Secondary | ICD-10-CM

## 2017-10-31 LAB — RAPID URINE DRUG SCREEN, HOSP PERFORMED
Amphetamines: NOT DETECTED
Barbiturates: NOT DETECTED
Benzodiazepines: NOT DETECTED
Cocaine: NOT DETECTED
Opiates: POSITIVE — AB
Tetrahydrocannabinol: NOT DETECTED

## 2017-10-31 LAB — BASIC METABOLIC PANEL
Anion gap: 9 (ref 5–15)
BUN: 10 mg/dL (ref 6–20)
CO2: 22 mmol/L (ref 22–32)
Calcium: 8.6 mg/dL — ABNORMAL LOW (ref 8.9–10.3)
Chloride: 106 mmol/L (ref 101–111)
Creatinine, Ser: 0.86 mg/dL (ref 0.44–1.00)
GFR calc Af Amer: >60 mL/min (ref >60)
GFR calc non Af Amer: >60 mL/min (ref >60)
Glucose, Bld: 109 mg/dL — ABNORMAL HIGH (ref 65–99)
Potassium: 3.5 mmol/L (ref 3.5–5.1)
Sodium: 137 mmol/L (ref 135–145)

## 2017-10-31 LAB — CBC
HCT: 35.6 % — ABNORMAL LOW (ref 36.0–46.0)
Hemoglobin: 11.1 g/dL — ABNORMAL LOW (ref 12.0–15.0)
MCH: 25.2 pg — ABNORMAL LOW (ref 26.0–34.0)
MCHC: 31.2 g/dL (ref 30.0–36.0)
MCV: 80.7 fL (ref 78.0–100.0)
Platelets: 240 10*3/uL (ref 150–400)
RBC: 4.41 MIL/uL (ref 3.87–5.11)
RDW: 13.2 % (ref 11.5–15.5)
WBC: 8.7 10*3/uL (ref 4.0–10.5)

## 2017-10-31 LAB — HIV ANTIBODY (ROUTINE TESTING W REFLEX): HIV Screen 4th Generation wRfx: NONREACTIVE

## 2017-10-31 LAB — D-DIMER, QUANTITATIVE (NOT AT ARMC): D-Dimer, Quant: 0.27 ug/mL-FEU (ref 0.00–0.50)

## 2017-10-31 MED ORDER — GUAIFENESIN ER 600 MG PO TB12
600.0000 mg | ORAL_TABLET | Freq: Two times a day (BID) | ORAL | Status: DC
  Administered 2017-10-31 – 2017-11-01 (×3): 600 mg via ORAL
  Filled 2017-10-31 (×3): qty 1

## 2017-10-31 MED ORDER — HYDROCOD POLST-CPM POLST ER 10-8 MG/5ML PO SUER
5.0000 mL | Freq: Two times a day (BID) | ORAL | Status: DC | PRN
  Administered 2017-11-01: 5 mL via ORAL
  Filled 2017-10-31: qty 5

## 2017-10-31 MED ORDER — ALPRAZOLAM 0.25 MG PO TABS: 0.2500 mg | ORAL_TABLET | Freq: Every day | ORAL | Status: DC | PRN

## 2017-10-31 MED ORDER — HYDROCOD POLST-CPM POLST ER 10-8 MG/5ML PO SUER
5.0000 mL | Freq: Once | ORAL | Status: AC
  Administered 2017-10-31: 5 mL via ORAL
  Filled 2017-10-31: qty 5

## 2017-10-31 MED ORDER — LORATADINE 10 MG PO TABS
10.0000 mg | ORAL_TABLET | Freq: Every day | ORAL | Status: DC
  Administered 2017-10-31 – 2017-11-01 (×2): 10 mg via ORAL
  Filled 2017-10-31 (×2): qty 1

## 2017-10-31 MED ORDER — IPRATROPIUM-ALBUTEROL 0.5-2.5 (3) MG/3ML IN SOLN
3.0000 mL | Freq: Four times a day (QID) | RESPIRATORY_TRACT | Status: DC
  Administered 2017-10-31 – 2017-11-01 (×5): 3 mL via RESPIRATORY_TRACT
  Filled 2017-10-31 (×5): qty 3

## 2017-10-31 MED ORDER — ENOXAPARIN SODIUM 40 MG/0.4ML ~~LOC~~ SOLN
40.0000 mg | SUBCUTANEOUS | Status: DC
  Administered 2017-10-31: 40 mg via SUBCUTANEOUS
  Filled 2017-10-31 (×2): qty 0.4

## 2017-10-31 MED ORDER — ONDANSETRON HCL 4 MG PO TABS: 4.0000 mg | ORAL_TABLET | Freq: Four times a day (QID) | ORAL | Status: DC | PRN

## 2017-10-31 MED ORDER — ALBUTEROL SULFATE (2.5 MG/3ML) 0.083% IN NEBU: 5.0000 mg | INHALATION_SOLUTION | RESPIRATORY_TRACT | Status: DC | PRN

## 2017-10-31 MED ORDER — PREDNISONE 20 MG PO TABS
40.0000 mg | ORAL_TABLET | Freq: Every day | ORAL | Status: DC
  Administered 2017-10-31 – 2017-11-01 (×2): 40 mg via ORAL
  Filled 2017-10-31 (×2): qty 2

## 2017-10-31 MED ORDER — METHYLPREDNISOLONE SODIUM SUCC 125 MG IJ SOLR
125.0000 mg | Freq: Once | INTRAMUSCULAR | Status: AC
  Administered 2017-10-31: 125 mg via INTRAVENOUS
  Filled 2017-10-31: qty 2

## 2017-10-31 MED ORDER — MAGNESIUM SULFATE 2 GM/50ML IV SOLN
2.0000 g | INTRAVENOUS | Status: AC
  Administered 2017-10-31: 2 g via INTRAVENOUS
  Filled 2017-10-31: qty 50

## 2017-10-31 MED ORDER — ONDANSETRON HCL 4 MG/2ML IJ SOLN: 4.0000 mg | Freq: Four times a day (QID) | INTRAMUSCULAR | Status: DC | PRN

## 2017-10-31 MED ORDER — ACETAMINOPHEN 650 MG RE SUPP: 650.0000 mg | Freq: Four times a day (QID) | RECTAL | Status: DC | PRN

## 2017-10-31 MED ORDER — FLUTICASONE PROPIONATE 50 MCG/ACT NA SUSP
1.0000 | Freq: Every day | NASAL | Status: DC
  Administered 2017-11-01: 1 via NASAL
  Filled 2017-10-31: qty 16

## 2017-10-31 MED ORDER — IPRATROPIUM-ALBUTEROL 0.5-2.5 (3) MG/3ML IN SOLN: 3.0000 mL | RESPIRATORY_TRACT | Status: DC | PRN

## 2017-10-31 MED ORDER — ALBUTEROL (5 MG/ML) CONTINUOUS INHALATION SOLN
INHALATION_SOLUTION | RESPIRATORY_TRACT | Status: AC
  Administered 2017-10-31: 10 mg/h via RESPIRATORY_TRACT
  Filled 2017-10-31: qty 20

## 2017-10-31 MED ORDER — ALBUTEROL (5 MG/ML) CONTINUOUS INHALATION SOLN
10.0000 mg/h | INHALATION_SOLUTION | Freq: Once | RESPIRATORY_TRACT | Status: AC
  Administered 2017-10-31: 10 mg/h via RESPIRATORY_TRACT

## 2017-10-31 MED ORDER — ACETAMINOPHEN 325 MG PO TABS: 650.0000 mg | ORAL_TABLET | Freq: Four times a day (QID) | ORAL | Status: DC | PRN

## 2017-10-31 NOTE — ED Notes (Signed)
Patient up to bathroom with 1 assist, states she feels a little dizzy when standing up however gait very steady.

## 2017-10-31 NOTE — ED Notes (Signed)
Pt ambulated to the bathroom and back to room pt stated she still feels dizz but not as much as before.

## 2017-10-31 NOTE — Progress Notes (Signed)
Patient admitted after midnight, please see H&P.  Not improved much-- Dr. Tamala Julian started on PO steroids after IV steroids given, may need IV steroids instead.  No wheezing but not moving much air post treatment.  Monitor O2 sats.  Good UOP.  Eulogio Bear DO

## 2017-10-31 NOTE — ED Notes (Signed)
MD at bedside. 

## 2017-10-31 NOTE — ED Notes (Signed)
Writer attempted to ambulate pt, started coughing and getting dizzy.  Pt sat on the edge of the bed and stated she was still dizzy and was not able to ambulate at this time

## 2017-10-31 NOTE — ED Notes (Addendum)
When walking into pt room I saw Pt eating chipotle as well as her family.

## 2017-10-31 NOTE — ED Provider Notes (Signed)
[facility] EMERGENCY DEPARTMENT Provider Note   CSN: [id number] Arrival date & time: 10/30/17  2003     History   Chief Complaint Chief Complaint  Patient presents with  . Shortness of Breath    HPI Joyce Welch is a 47 y.o. female.  The history is provided by the patient.  Shortness of Breath  This is a new problem. The average episode lasts 1 week. The problem occurs frequently.The current episode started more than 2 days ago. The problem has been gradually worsening. Associated symptoms include cough and wheezing. Pertinent negatives include no fever, no sputum production, no hemoptysis, no chest pain, no syncope, no vomiting and no leg swelling. It is unknown what precipitated the problem. She has tried oral steroids and beta-agonist inhalers for the symptoms. The treatment provided no relief. Associated medical issues include asthma.  Patient with history of asthma who presents with shortness of breath for 2-week.  She reports that she had progressively worsening cough, wheezing, dyspnea on exertion.  No fevers, no chest pain.  No hemoptysis.  She reports today it has become worse.  She saw her PCP yesterday, was placed on oral steroids has been using albuterol at home without relief.  She reports this feels like prior asthma exacerbations.  No lower extremity edema.  Denies history of CAD/PE.  She is a non-smoker No recent travel or surgery  Past Medical History:  Diagnosis Date  . Asthma   . Fibroids   . OAB (overactive bladder)     Patient Active Problem List   Diagnosis Date Noted  . Strain of quadriceps tendon 02/14/2014  . Symptomatic menopausal or female climacteric states 07/31/2013    Past Surgical History:  Procedure Laterality Date  . ABDOMINAL HYSTERECTOMY  02/05/2006   partial  . [place], 2002, 2005,    x 3    OB History    Gravida Para Term Preterm AB Living   3 3 1     3    SAB TAB Ectopic Multiple Live Births    3       Home Medications    Prior to Admission medications   Medication Sig Start Date End Date Taking? Authorizing Provider  albuterol (PROVENTIL HFA;VENTOLIN HFA) 108 (90 BASE) MCG/ACT inhaler Inhale 1 puff into the lungs daily.     [provider]  albuterol (PROVENTIL) (2.5 MG/3ML) 0.083% nebulizer solution Take 2.5 mg by nebulization every 6 (six) hours as needed for wheezing or shortness of breath.    [provider]  calcium carbonate (TUMS - DOSED IN MG ELEMENTAL CALCIUM) 500 MG chewable tablet Chew 1 tablet by mouth daily.    [provider]  cyclobenzaprine (FLEXERIL) 10 MG tablet Take 0.5-1 tablets (5-10 mg total) by mouth 2 (two) times daily as needed for muscle spasms. 05/06/17   Quintella Reichert, MD  fluticasone [facility]) 50 MCG/ACT nasal spray Place into both nostrils daily.    [provider]  loratadine (CLARITIN) 10 MG tablet Take 10 mg by mouth daily.    [provider]  naproxen (NAPROSYN) 500 MG tablet Take 1 tablet (500 mg total) by mouth 2 (two) times daily. 05/06/17   Quintella Reichert, MD  oxybutynin (DITROPAN) 5 MG tablet Take 1 tablet (5 mg total) by mouth 3 (three) times daily. 04/14/17   Janne Napoleon, NP  predniSONE (DELTASONE) 10 MG tablet Take 4 tablets (40 mg total) by mouth daily. 05/06/17   Quintella Reichert, MD  Family History Family History  Problem Relation Age of Onset  . Breast cancer Maternal Aunt     Social History Social History   Tobacco Use  . Smoking status: Never Smoker  . Smokeless tobacco: Never Used  Substance Use Topics  . Alcohol use: Yes  . Drug use: No     Allergies   Patient has no known allergies.   Review of Systems Review of Systems  Constitutional: Negative for fever.  Respiratory: Positive for cough, shortness of breath and wheezing. Negative for hemoptysis and sputum production.   Cardiovascular: Negative for chest pain, leg swelling and syncope.  Gastrointestinal: Negative  for vomiting.  Musculoskeletal: Negative for myalgias.  All other systems reviewed and are negative.    Physical Exam Updated Vital Signs BP (!) 169/65 (BP Location: Right Arm)   Pulse 89   Temp 98.4 F (36.9 C) (Oral)   Resp 18   Ht 1.676 m (5\' 6" )   Wt 106.6 kg (235 lb)   SpO2 100%   BMI 37.93 kg/m   Physical Exam  CONSTITUTIONAL: Well developed/well nourished HEAD: Normocephalic/atraumatic EYES: EOMI/PERRL ENMT: Mucous membranes moist NECK: supple no meningeal signs SPINE/BACK:entire spine nontender CV: S1/S2 noted, no murmurs/rubs/gallops noted LUNGS: Mild tachypnea noted, decreased breath sounds in the bases, no wheezing/rales noted, patient coughs frequently during exam ABDOMEN: soft, nontender, no rebound or guarding, bowel sounds noted throughout abdomen GU:no cva tenderness NEURO: Pt is awake/alert/appropriate, moves all extremitiesx4.  No facial droop.   EXTREMITIES: pulses normal/equal, full ROM, no lower extremity edema or tenderness SKIN: warm, color normal PSYCH: no abnormalities of mood noted, alert and oriented to situation  ED Treatments / Results  Labs (all labs ordered are listed, but only abnormal results are displayed) Labs Reviewed  BASIC METABOLIC PANEL - Abnormal; Notable for the following components:      Result Value   Glucose, Bld 115 (*)    All other components within normal limits  CBC - Abnormal; Notable for the following components:   WBC 10.8 (*)    MCH 25.7 (*)    All other components within normal limits  D-DIMER, QUANTITATIVE (NOT AT [facility])  RAPID URINE DRUG SCREEN, HOSP PERFORMED  I-STAT TROPONIN, ED    EKG  EKG Interpretation  Date/Time:  Tuesday October 30 2017 20:10:49 EST Ventricular Rate:  111 PR Interval:  158 QRS Duration: 68 QT Interval:  312 QTC Calculation: 424 R Axis:   12 Text Interpretation:  Sinus tachycardia Low voltage QRS Borderline ECG Confirmed by Ripley Fraise [phone]) on 10/30/2017 11:38:56 PM        Radiology Dg Chest 2 View  Result Date: 10/30/2017 CLINICAL DATA:  Pt c/o SOB with exertion and cough x 3 days. Hx of asthma. Pt is a nonsmoker. EXAM: CHEST  2 VIEW COMPARISON:  09/05/2016 FINDINGS: Cardiac silhouette is normal in size. Normal mediastinal and hilar contours. Clear lungs.  No pleural effusion or pneumothorax. Skeletal structures are unremarkable. IMPRESSION: No active cardiopulmonary disease. Electronically Signed   By: Lajean Manes M.D.   On: 10/30/2017 20:39    Procedures Procedures  CRITICAL CARE Performed by: Sharyon Cable Total critical care time: 35 minutes Critical care time was exclusive of separately billable procedures and treating other patients. Critical care was necessary to treat or prevent imminent or life-threatening deterioration. Critical care was time spent personally by me on the following activities: development of treatment plan with patient and/or surrogate as well as nursing, discussions with consultants, evaluation of patient's  response to treatment, examination of patient, obtaining history from patient or surrogate, ordering and performing treatments and interventions, ordering and review of laboratory studies, ordering and review of radiographic studies, pulse oximetry and re-evaluation of patient's condition. PATIENT WITH WORSENING SHORTNESS OF BREATH, TACHYPNEA, SHE DID NOT RESPOND TO HOUR LONG NEBULIZER TREATMENT AND REQUIRED ADMISSION  Medications Ordered in ED Medications  magnesium sulfate IVPB 2 g 50 mL (not administered)  albuterol (PROVENTIL) (2.5 MG/3ML) 0.083% nebulizer solution 5 mg (5 mg Nebulization Given 10/31/17 0014)  ipratropium (ATROVENT) nebulizer solution 0.5 mg (0.5 mg Nebulization Given 10/31/17 0014)  benzonatate (TESSALON) capsule 100 mg (100 mg Oral Given 10/31/17 0014)  albuterol (PROVENTIL,VENTOLIN) solution continuous neb (10 mg/hr Nebulization Given 10/31/17 0110)  chlorpheniramine-HYDROcodone (TUSSIONEX) 10-8  MG/5ML suspension 5 mL (5 mLs Oral Given 10/31/17 0304)     Initial Impression / Assessment and Plan / ED Course  I have reviewed the triage vital signs and the nursing notes.  Pertinent labs & imaging results that were available during my care of the patient were reviewed by me and considered in my medical decision making (see chart for details).     12:14 AM Patient with worsening cough/shortness of breath despite home treatments for asthma.  She does cough frequently during exam and reports this feels like asthma, but she does not have significant wheezing at this time.  Initial chest x-ray reviewed and negative.  My suspicion for acute coronary syndrome is low without any ST changes on EKG and troponin is negative.  We will add on a d-dimer due to the tachycardia and shortness of breath. 12:58 AM D-dimer negative On repeat exam, patient still tachypneic, with coarse breath sounds bilaterally.  We agreed to start an hour-long albuterol treatment. 3:49 AM After hour-long neb treatment, patient actually worsened, was tachypneic and tachycardic.  On ambulation she felt dizzy and short of breath.  Patient will need to be admitted for further therapy.  Discussed with Dr. Fuller Plan with [facility] will be admitted. Final Clinical Impressions(s) / ED Diagnoses   Final diagnoses:  Moderate persistent asthma with status asthmaticus    ED Discharge Orders    None       Ripley Fraise, MD 10/31/17 [phone]

## 2017-10-31 NOTE — H&P (Signed)
History and Physical    Joyce Welch IOE:[id number] DOB: December 20, 1970 DOA: 10/30/2017  Referring MD/NP/PA: Dr. Ripley Fraise PCP: [name], MD  Patient coming from: home  Chief Complaint:   I have personally briefly reviewed patient's old medical records in [place]   HPI: Joyce Welch is a 47 y.o. female with medical history significant of asthma, fibroids, and OAB; who presents with complaints of 3 days of worsening shortness of breath and cough.  Symptoms initially started 1-2 week ago with a  mostly non-productive cough with wheezing and shortness of breath.  At home she tried using her home inhalers without relief of symptoms.  Associated symptoms included complaints of chest discomfort from coughing.  Denies having any significant fever, nausea, vomiting, diarrhea, headache, myalgias, recent sick contacts, leg swelling, recent travel, or calf pain.  She was seen by her primary care provider 2 days ago and started on oral steroids.  Despite taking these medicines he noted no change in symptoms and actually felt like they were worse.  Patient complaining of dyspnea on exertion.  Never previously requiring hospitalization for asthma symptoms in the past.  ED Course: Upon admission into the emergency department patient was seen to be afebrile, pulse 85-126, respirations 18-33, blood pressure 119/59-160 9/65, O2 saturations 100%.  Labs revealed WBC 10.8 and d-dimer 0.27 chest x-ray otherwise noted to be clear.  Patient was given DuoNeb and hour-long breathing treatment without relief in symptoms.  TRH called to admit  Review of Systems  Constitutional: Negative for chills and fever.  HENT: Negative for congestion and nosebleeds.   Eyes: Negative for photophobia and pain.  Respiratory: Positive for cough, shortness of breath and wheezing. Negative for sputum production.   Cardiovascular: Negative for palpitations and leg swelling.  Gastrointestinal: Negative for abdominal pain, blood  in stool, constipation, diarrhea and vomiting.  Genitourinary: Negative for dysuria and flank pain.  Musculoskeletal: Negative for joint pain and myalgias.  Skin: Negative for itching.  Neurological: Positive for dizziness. Negative for sensory change and loss of consciousness.  Endo/Heme/Allergies: Positive for environmental allergies.  Psychiatric/Behavioral: Negative for hallucinations and substance abuse.    Past Medical History:  Diagnosis Date  . Asthma   . Fibroids   . OAB (overactive bladder)     Past Surgical History:  Procedure Laterality Date  . ABDOMINAL HYSTERECTOMY  02/05/2006   partial  . [place], 2002, 2005,    x 3     reports that  has never smoked. she has never used smokeless tobacco. She reports that she drinks alcohol. She reports that she does not use drugs.  No Known Allergies  Family History  Problem Relation Age of Onset  . Breast cancer Maternal Aunt     Prior to Admission medications   Medication Sig Start Date End Date Taking? Authorizing Provider  albuterol (PROVENTIL HFA;VENTOLIN HFA) 108 (90 BASE) MCG/ACT inhaler Inhale 1 puff into the lungs daily.    Yes [provider]  albuterol (PROVENTIL) (2.5 MG/3ML) 0.083% nebulizer solution Take 2.5 mg by nebulization every 6 (six) hours as needed for wheezing or shortness of breath.   Yes [provider]  ALPRAZolam (XANAX) 0.25 MG tablet Take 0.25 mg by mouth daily as needed for anxiety.  10/29/17  Yes [provider]  FLOVENT HFA 220 MCG/ACT inhaler Inhale 1 puff into the lungs daily. 10/15/17  Yes [provider]  fluticasone (FLONASE) 50 MCG/ACT nasal spray Place into both nostrils daily.   Yes  [provider]  loratadine (CLARITIN) 10 MG tablet Take 10 mg by mouth daily.   Yes [provider]  predniSONE (DELTASONE) 20 MG tablet Take 20-40 mg by mouth See admin instructions. Take 2 tablets for 3 days then take 1 tablet for 4 days  10/29/17  Yes [provider]  cyclobenzaprine (FLEXERIL) 10 MG tablet Take 0.5-1 tablets (5-10 mg total) by mouth 2 (two) times daily as needed for muscle spasms. Patient not taking: Reported on 10/31/2017 05/06/17   Quintella Reichert, MD  naproxen (NAPROSYN) 500 MG tablet Take 1 tablet (500 mg total) by mouth 2 (two) times daily. Patient not taking: Reported on 10/31/2017 05/06/17   Quintella Reichert, MD  oxybutynin (DITROPAN) 5 MG tablet Take 1 tablet (5 mg total) by mouth 3 (three) times daily. Patient not taking: Reported on 10/31/2017 04/14/17   Janne Napoleon, NP  predniSONE (DELTASONE) 10 MG tablet Take 4 tablets (40 mg total) by mouth daily. Patient not taking: Reported on 10/31/2017 05/06/17   Quintella Reichert, MD    Physical Exam:  Constitutional: Obese female in mild respiratory distress Vitals:   10/31/17 0045 10/31/17 0054 10/31/17 0130 10/31/17 0215  BP: (!) 145/70  (!) 129/58 (!) 153/67  Pulse: 97  (!) 112 (!) 126  Resp: (!) 29  (!) 28 (!) 33  Temp:  98.4 F (36.9 C)    TempSrc:  Oral    SpO2: 100%  100% 100%  Weight:      Height:       Eyes: PERRL, lids and conjunctivae normal ENMT: Mucous membranes are moist. Posterior pharynx clear of any exudate or lesions. Normal dentition.  Neck: normal, supple, no masses, no thyromegaly Respiratory: Tachypneic with decreased overall aeration and expiratory wheeze.  Patient talking in shortened sentences, but able to maintain O2 saturations on room air. Cardiovascular: Tachycardic, no murmurs / rubs / gallops. No extremity edema. 2+ pedal pulses. No carotid bruits.  Abdomen: no tenderness, no masses palpated. No hepatosplenomegaly. Bowel sounds positive.  Musculoskeletal: no clubbing / cyanosis. No joint deformity upper and lower extremities. Good ROM, no contractures. Normal muscle tone.  Skin: no rashes, lesions, ulcers. No induration Neurologic: CN 2-12 grossly intact. Sensation intact, DTR normal. Strength 5/5 in all 4.  Psychiatric:  Normal judgment and insight. Alert and oriented x 3. Normal mood.     Labs on Admission: I have personally reviewed following labs and imaging studies  CBC: Recent Labs  Lab 10/30/17 2013  WBC 10.8*  HGB 12.3  HCT 39.0  MCV 81.4  PLT 546   Basic Metabolic Panel: Recent Labs  Lab 10/30/17 2013  NA 138  K 4.1  CL 106  CO2 22  GLUCOSE 115*  BUN 10  CREATININE 0.77  CALCIUM 9.2   GFR: Estimated Creatinine Clearance: 106.2 mL/min (by C-G formula based on SCr of 0.77 mg/dL). Liver Function Tests: No results for input(s): AST, ALT, ALKPHOS, BILITOT, PROT, ALBUMIN in the last 168 hours. No results for input(s): LIPASE, AMYLASE in the last 168 hours. No results for input(s): AMMONIA in the last 168 hours. Coagulation Profile: No results for input(s): INR, PROTIME in the last 168 hours. Cardiac Enzymes: No results for input(s): CKTOTAL, CKMB, CKMBINDEX, TROPONINI in the last 168 hours. BNP (last 3 results) No results for input(s): PROBNP in the last 8760 hours. HbA1C: No results for input(s): HGBA1C in the last 72 hours. CBG: No results for input(s): GLUCAP in the last 168 hours. Lipid Profile: No results for input(s):  CHOL, HDL, LDLCALC, TRIG, CHOLHDL, LDLDIRECT in the last 72 hours. Thyroid Function Tests: No results for input(s): TSH, T4TOTAL, FREET4, T3FREE, THYROIDAB in the last 72 hours. Anemia Panel: No results for input(s): VITAMINB12, FOLATE, FERRITIN, TIBC, IRON, RETICCTPCT in the last 72 hours. Urine analysis:    Component Value Date/Time   COLORURINE YELLOW 09/28/2015 1020   APPEARANCEUR CLEAR 09/28/2015 1020   LABSPEC 1.025 04/14/2017 1247   PHURINE 6.0 04/14/2017 1247   GLUCOSEU NEGATIVE 04/14/2017 1247   HGBUR TRACE (A) 04/14/2017 1247   BILIRUBINUR NEGATIVE 04/14/2017 1247   KETONESUR NEGATIVE 04/14/2017 1247   PROTEINUR NEGATIVE 04/14/2017 1247   UROBILINOGEN 0.2 04/14/2017 1247   NITRITE NEGATIVE 04/14/2017 1247   LEUKOCYTESUR NEGATIVE  04/14/2017 1247   Sepsis Labs: No results found for this or any previous visit (from the past 240 hour(s)).   Radiological Exams on Admission: Dg Chest 2 View  Result Date: 10/30/2017 CLINICAL DATA:  Pt c/o SOB with exertion and cough x 3 days. Hx of asthma. Pt is a nonsmoker. EXAM: CHEST  2 VIEW COMPARISON:  09/05/2016 FINDINGS: Cardiac silhouette is normal in size. Normal mediastinal and hilar contours. Clear lungs.  No pleural effusion or pneumothorax. Skeletal structures are unremarkable. IMPRESSION: No active cardiopulmonary disease. Electronically Signed   By: Lajean Manes M.D.   On: 10/30/2017 20:39    EKG: Independently reviewed. Sinus tachycardia at 111 bpm  Assessment/Plan Asthma exacerbation: Acute.  Patient presents with complaints of cough and shortness of breath.  Chest x-ray otherwise noted to be clear.  Physical exam reveals decreased overall aeration with wheezing. - Admit to telemetry bed - Nasal cannula oxygen as needed - Give 125 mg of Solu-Medrol IV once, and placed on steroid taper in a.m. - Give 2 g of magnesium sulfate x1 dose now - Duonebs 4 times daily and as needed for shortness of breath/wheezing - Continue Claritin - Mucinex  Leukocytosis: Acute.  WBC elevated 10.8 on admission.  Patient has been recently on steroids by PCP and reports no other infectious type symptoms. - Recheck CBC in a.m.  DVT prophylaxis: lovenox Code Status: full Family Communication: Plan of care with the patient family present at bedside Disposition Plan: Discharge home in 1-2 days Consults called: none  Admission status: observation  Norval Morton MD [facility] Pager [phone]   If 7PM-7AM, please contact night-coverage [email or web address] Password [email or web address]  10/31/2017, 3:06 AM

## 2017-11-01 DIAGNOSIS — D72829 Elevated white blood cell count, unspecified: Secondary | ICD-10-CM

## 2017-11-01 DIAGNOSIS — J45901 Unspecified asthma with (acute) exacerbation: Secondary | ICD-10-CM | POA: Diagnosis not present

## 2017-11-01 MED ORDER — GUAIFENESIN ER 600 MG PO TB12: 600.0000 mg | ORAL_TABLET | Freq: Two times a day (BID) | ORAL | 0 refills | Status: DC

## 2017-11-01 MED ORDER — HYDROCOD POLST-CPM POLST ER 10-8 MG/5ML PO SUER: 5.0000 mL | Freq: Two times a day (BID) | ORAL | 0 refills | Status: DC | PRN

## 2017-11-01 MED ORDER — HYDROCORTISONE ACETATE 25 MG RE SUPP: 25.0000 mg | Freq: Two times a day (BID) | RECTAL | 0 refills | Status: DC | PRN

## 2017-11-01 MED ORDER — WITCH HAZEL-GLYCERIN EX PADS
1.0000 "application " | MEDICATED_PAD | CUTANEOUS | Status: DC | PRN
  Filled 2017-11-01: qty 100

## 2017-11-01 MED ORDER — FLUCONAZOLE 150 MG PO TABS: 150.0000 mg | ORAL_TABLET | Freq: Every day | ORAL | 0 refills | Status: AC

## 2017-11-01 MED ORDER — HYDROCORTISONE ACETATE 25 MG RE SUPP
25.0000 mg | Freq: Two times a day (BID) | RECTAL | Status: DC
  Administered 2017-11-01: 25 mg via RECTAL
  Filled 2017-11-01 (×2): qty 1

## 2017-11-01 MED ORDER — PREDNISONE 10 MG PO TABS: ORAL_TABLET | ORAL | 0 refills | Status: DC

## 2017-11-01 MED ORDER — PHENOL 1.4 % MT LIQD
1.0000 | OROMUCOSAL | Status: DC | PRN
  Administered 2017-11-01: 1 via OROMUCOSAL
  Filled 2017-11-01: qty 177

## 2017-11-01 MED ORDER — AMOXICILLIN-POT CLAVULANATE 875-125 MG PO TABS: 1.0000 | ORAL_TABLET | Freq: Two times a day (BID) | ORAL | 0 refills | Status: DC

## 2017-11-01 MED ORDER — POLYETHYLENE GLYCOL 3350 17 G PO PACK
17.0000 g | PACK | Freq: Every day | ORAL | Status: DC
  Administered 2017-11-01: 17 g via ORAL
  Filled 2017-11-01: qty 1

## 2017-11-01 NOTE — Discharge Instructions (Signed)
Asthma, Adult °Asthma is a recurring condition in which the airways tighten and narrow. Asthma can make it difficult to breathe. It can cause coughing, wheezing, and shortness of breath. Asthma episodes, also called asthma attacks, range from minor to life-threatening. Asthma cannot be cured, but medicines and lifestyle changes can help control it. °What are the causes? °Asthma is believed to be caused by inherited (genetic) and environmental factors, but its exact cause is unknown. Asthma may be triggered by allergens, lung infections, or irritants in the air. Asthma triggers are different for each person. Common triggers include: °· Animal dander. °· Dust mites. °· Cockroaches. °· Pollen from trees or grass. °· Mold. °· Smoke. °· Air pollutants such as dust, household cleaners, hair sprays, aerosol sprays, paint fumes, strong chemicals, or strong odors. °· Cold air, weather changes, and winds (which increase molds and pollens in the air). °· Strong emotional expressions such as crying or laughing hard. °· Stress. °· Certain medicines (such as aspirin) or types of drugs (such as beta-blockers). °· Sulfites in foods and drinks. Foods and drinks that may contain sulfites include dried fruit, potato chips, and sparkling grape juice. °· Infections or inflammatory conditions such as the flu, a cold, or an inflammation of the nasal membranes (rhinitis). °· Gastroesophageal reflux disease (GERD). °· Exercise or strenuous activity. ° °What are the signs or symptoms? °Symptoms may occur immediately after asthma is triggered or many hours later. Symptoms include: °· Wheezing. °· Excessive nighttime or early morning coughing. °· Frequent or severe coughing with a common cold. °· Chest tightness. °· Shortness of breath. ° °How is this diagnosed? °The diagnosis of asthma is made by a review of your medical history and a physical exam. Tests may also be performed. These may include: °· Lung function studies. These tests show how  much air you breathe in and out. °· Allergy tests. °· Imaging tests such as X-rays. ° °How is this treated? °Asthma cannot be cured, but it can usually be controlled. Treatment involves identifying and avoiding your asthma triggers. It also involves medicines. There are 2 classes of medicine used for asthma treatment: °· Controller medicines. These prevent asthma symptoms from occurring. They are usually taken every day. °· Reliever or rescue medicines. These quickly relieve asthma symptoms. They are used as needed and provide short-term relief. ° °Your health care provider will help you create an asthma action plan. An asthma action plan is a written plan for managing and treating your asthma attacks. It includes a list of your asthma triggers and how they may be avoided. It also includes information on when medicines should be taken and when their dosage should be changed. An action plan may also involve the use of a device called a peak flow meter. A peak flow meter measures how well the lungs are working. It helps you monitor your condition. °Follow these instructions at home: °· Take medicines only as directed by your health care provider. Speak with your health care provider if you have questions about how or when to take the medicines. °· Use a peak flow meter as directed by your health care provider. Record and keep track of readings. °· Understand and use the action plan to help minimize or stop an asthma attack without needing to seek medical care. °· Control your home environment in the following ways to help prevent asthma attacks: °? Do not smoke. Avoid being exposed to secondhand smoke. °? Change your heating and air conditioning filter regularly. °? Limit   your use of fireplaces and wood stoves. °? Get rid of pests (such as roaches and mice) and their droppings. °? Throw away plants if you see mold on them. °? Clean your floors and dust regularly. Use unscented cleaning products. °? Try to have someone  else vacuum for you regularly. Stay out of rooms while they are being vacuumed and for a short while afterward. If you vacuum, use a dust mask from a hardware store, a double-layered or microfilter vacuum cleaner bag, or a vacuum cleaner with a HEPA filter. °? Replace carpet with wood, tile, or vinyl flooring. Carpet can trap dander and dust. °? Use allergy-proof pillows, mattress covers, and box spring covers. °? Wash bed sheets and blankets every week in hot water and dry them in a dryer. °? Use blankets that are made of polyester or cotton. °? Clean bathrooms and kitchens with bleach. If possible, have someone repaint the walls in these rooms with mold-resistant paint. Keep out of the rooms that are being cleaned and painted. °? Wash hands frequently. °Contact a health care provider if: °· You have wheezing, shortness of breath, or a cough even if taking medicine to prevent attacks. °· The colored mucus you cough up (sputum) is thicker than usual. °· Your sputum changes from clear or white to yellow, green, gray, or bloody. °· You have any problems that may be related to the medicines you are taking (such as a rash, itching, swelling, or trouble breathing). °· You are using a reliever medicine more than 2-3 times per week. °· Your peak flow is still at 50-79% of your personal best after following your action plan for 1 hour. °· You have a fever. °Get help right away if: °· You seem to be getting worse and are unresponsive to treatment during an asthma attack. °· You are short of breath even at rest. °· You get short of breath when doing very little physical activity. °· You have difficulty eating, drinking, or talking due to asthma symptoms. °· You develop chest pain. °· You develop a fast heartbeat. °· You have a bluish color to your lips or fingernails. °· You are light-headed, dizzy, or faint. °· Your peak flow is less than 50% of your personal best. °This information is not intended to replace advice given to  you by your health care provider. Make sure you discuss any questions you have with your health care provider. °Document Released: 09/18/2005 Document Revised: 03/01/2016 Document Reviewed: 04/17/2013 °Elsevier Interactive Patient Education © 2017 [employer] ° ° °Asthma Attack Prevention, Adult °Although you may not be able to control the fact that you have asthma, you can take actions to prevent episodes of asthma (asthma attacks). These actions include: °· Creating a written plan for managing and treating your asthma attacks (asthma action plan). °· Monitoring your asthma. °· Avoiding things that can irritate your airways or make your asthma symptoms worse (asthma triggers). °· Taking your medicines as directed. °· Acting quickly if you have signs or symptoms of an asthma attack. ° °What are some ways to prevent an asthma attack? °Create a plan °Work with your health care provider to create an asthma action plan. This plan should include: °· A list of your asthma triggers and how to avoid them. °· A list of symptoms that you experience during an asthma attack. °· Information about when to take medicine and how much medicine to take. °· Information to help you understand your peak flow measurements. °· Contact information   for your health care providers. °· Daily actions that you can take to control asthma. ° °Monitor your asthma ° °To monitor your asthma: °· Use your peak flow meter every morning and every evening for 2-3 weeks. Record the results in a journal. A drop in your peak flow numbers on one or more days may mean that you are starting to have an asthma attack, even if you are not having symptoms. °· When you have asthma symptoms, write them down in a journal. ° °Avoid asthma triggers ° °Work with your health care provider to find out what your asthma triggers are. This can be done by: °· Being tested for allergies. °· Keeping a journal that notes when asthma attacks occur and what may have contributed  to them. °· Asking your health care provider whether other medical conditions make your asthma worse. ° °Common asthma triggers include: °· Dust. °· Smoke. This includes campfire smoke and secondhand smoke from tobacco products. °· Pet dander. °· Trees, grasses or pollens. °· Very cold, dry, or humid air. °· Mold. °· Foods that contain high amounts of sulfites. °· Strong smells. °· Engine exhaust and air pollution. °· Aerosol sprays and fumes from household cleaners. °· Household pests and their droppings, including dust mites and cockroaches. °· Certain medicines, including NSAIDs. ° °Once you have determined your asthma triggers, take steps to avoid them. Depending on your triggers, you may be able to reduce the chance of an asthma attack by: °· Keeping your home clean. Have someone dust and vacuum your home for you 1 or 2 times a week. If possible, have them use a high-efficiency particulate arrestance (HEPA) vacuum. °· Washing your sheets weekly in hot water. °· Using allergy-proof mattress covers and casings on your bed. °· Keeping pets out of your home. °· Taking care of mold and water problems in your home. °· Avoiding areas where people smoke. °· Avoiding using strong perfumes or odor sprays. °· Avoid spending a lot of time outdoors when pollen counts are high and on very windy days. °· Talking with your health care provider before stopping or starting any new medicines. ° °Medicines °Take over-the-counter and prescription medicines only as told by your health care provider. Many asthma attacks can be prevented by carefully following your medicine schedule. Taking your medicines correctly is especially important when you cannot avoid certain asthma triggers. Even if you are doing well, do not stop taking your medicine and do not take less medicine. °Act quickly °If an asthma attack happens, acting quickly can decrease how severe it is and how long it lasts. Take these actions: °· Pay attention to your  symptoms. If you are coughing, wheezing, or having difficulty breathing, do not wait to see if your symptoms go away on their own. Follow your asthma action plan. °· If you have followed your asthma action plan and your symptoms are not improving, call your health care provider or seek immediate medical care at the nearest hospital. ° °It is important to write down how often you need to use your fast-acting rescue inhaler. You can track how often you use an inhaler in your journal. If you are using your rescue inhaler more often, it may mean that your asthma is not under control. Adjusting your asthma treatment plan may help you to prevent future asthma attacks and help you to gain better control of your condition. °How can I prevent an asthma attack when I exercise? ° °Exercise is a common asthma trigger.   To prevent asthma attacks during exercise: °· Follow advice from your health care provider about whether you should use your fast-acting inhaler before exercising. Many people with asthma experience exercise-induced bronchoconstriction (EIB). This condition often worsens during vigorous exercise in cold, humid, or dry environments. Usually, people with EIB can stay very active by using a fast-acting inhaler before exercising. °· Avoid exercising outdoors in very cold or humid weather. °· Avoid exercising outdoors when pollen counts are high. °· Warm up and cool down when exercising. °· Stop exercising right away if asthma symptoms start. ° °Consider taking part in exercises that are less likely to cause asthma symptoms such as: °· Indoor swimming. °· Biking. °· Walking. °· Hiking. °· Playing football. ° °This information is not intended to replace advice given to you by your health care provider. Make sure you discuss any questions you have with your health care provider. °Document Released: 09/06/2009 Document Revised: 05/19/2016 Document Reviewed: 03/04/2016 °Elsevier Interactive Patient Education © 2018  [employer] ° °

## 2017-11-01 NOTE — Discharge Summary (Signed)
Physician Discharge Summary  Joyce Welch NFA:[id number] DOB: [birth year] DOA: 10/30/2017  PCP: [name], MD  Admit date: 10/30/2017 Discharge date: 11/01/2017   Recommendations for Outpatient Follow-Up:      Discharge Diagnosis:   Principal Problem:   Asthma exacerbation Active Problems:   Leukocytosis   Discharge disposition:  Home.  Discharge Condition: Improved.  Diet recommendation: Low sodium, heart healthy.  Carbohydrate-modified  Wound care: None.   History of Present Illness:   Joyce Welch is a 47 y.o. female with medical history significant of asthma, fibroids, and OAB; who presents with complaints of 3 days of worsening shortness of breath and cough.  Symptoms initially started 1-2 week ago with a  mostly non-productive cough with wheezing and shortness of breath.  At home she tried using her home inhalers without relief of symptoms.  Associated symptoms included complaints of chest discomfort from coughing.  Denies having any significant fever, nausea, vomiting, diarrhea, headache, myalgias, recent sick contacts, leg swelling, recent travel, or calf pain.  She was seen by her primary care provider 2 days ago and started on oral steroids.  Despite taking these medicines he noted no change in symptoms and actually felt like they were worse.  Patient complaining of dyspnea on exertion.  Never previously requiring hospitalization for asthma symptoms in the past.      Hospital Course by Problem:   Asthma exacerbation: Acute.   steroid taper 2 g of magnesium sulfat - Continue Claritin - Mucinex -not requiring O2, no wheezing -note for work given -resume nebs  Leukocytosis: Acute.  -augmentin for URI symptoms, most likely viral but greenish mucous production     Medical Consultants:    None.   Discharge Exam:   Vitals:   11/01/17 0433 11/01/17 0848  BP: 131/75 119/71  Pulse: 72 90  Resp: 18 18  Temp: 98.5 F (36.9 C) 97.9 F (36.6 C)    SpO2: 98% 98%   Vitals:   10/31/17 2056 10/31/17 2104 11/01/17 0433 11/01/17 0848  BP: (!) 121/58  131/75 119/71  Pulse: 93  72 90  Resp: (!) 21  18 18   Temp: 98.1 F (36.7 C)  98.5 F (36.9 C) 97.9 F (36.6 C)  TempSrc: Oral  Oral Oral  SpO2: 97% 98% 98% 98%  Weight: 105.8 kg (233 lb 4 oz)     Height:        Gen:  NAD    The results of significant diagnostics from this hospitalization (including imaging, microbiology, ancillary and laboratory) are listed below for reference.     Procedures and Diagnostic Studies:   Dg Chest 2 View  Result Date: 10/30/2017 CLINICAL DATA:  Pt c/o SOB with exertion and cough x 3 days. Hx of asthma. Pt is a nonsmoker. EXAM: CHEST  2 VIEW COMPARISON:  09/05/2016 FINDINGS: Cardiac silhouette is normal in size. Normal mediastinal and hilar contours. Clear lungs.  No pleural effusion or pneumothorax. Skeletal structures are unremarkable. IMPRESSION: No active cardiopulmonary disease. Electronically Signed   By: Lajean Manes M.D.   On: 10/30/2017 20:39     Labs:   Basic Metabolic Panel: Recent Labs  Lab 10/30/17 2013 10/31/17 0439  NA 138 137  K 4.1 3.5  CL 106 106  CO2 22 22  GLUCOSE 115* 109*  BUN 10 10  CREATININE 0.77 0.86  CALCIUM 9.2 8.6*   GFR Estimated Creatinine Clearance: 98.4 mL/min (by C-G formula based on SCr of 0.86 mg/dL). Liver Function Tests: No results for input(s):  AST, ALT, ALKPHOS, BILITOT, PROT, ALBUMIN in the last 168 hours. No results for input(s): LIPASE, AMYLASE in the last 168 hours. No results for input(s): AMMONIA in the last 168 hours. Coagulation profile No results for input(s): INR, PROTIME in the last 168 hours.  CBC: Recent Labs  Lab 10/30/17 2013 10/31/17 0439  WBC 10.8* 8.7  HGB 12.3 11.1*  HCT 39.0 35.6*  MCV 81.4 80.7  PLT 289 240   Cardiac Enzymes: No results for input(s): CKTOTAL, CKMB, CKMBINDEX, TROPONINI in the last 168 hours. BNP: Invalid input(s): POCBNP CBG: No results  for input(s): GLUCAP in the last 168 hours. D-Dimer Recent Labs    10/30/17 2356  DDIMER 0.27   Hgb A1c No results for input(s): HGBA1C in the last 72 hours. Lipid Profile No results for input(s): CHOL, HDL, LDLCALC, TRIG, CHOLHDL, LDLDIRECT in the last 72 hours. Thyroid function studies No results for input(s): TSH, T4TOTAL, T3FREE, THYROIDAB in the last 72 hours.  Invalid input(s): FREET3 Anemia work up No results for input(s): VITAMINB12, FOLATE, FERRITIN, TIBC, IRON, RETICCTPCT in the last 72 hours. Microbiology No results found for this or any previous visit (from the past 240 hour(s)).   Discharge Instructions:   Discharge Instructions    Diet - low sodium heart healthy   Complete by:  As directed    Diet Carb Modified   Complete by:  As directed    Increase activity slowly   Complete by:  As directed      Allergies as of 11/01/2017   No Known Allergies     Medication List    STOP taking these medications   cyclobenzaprine 10 MG tablet Commonly known as:  FLEXERIL   naproxen 500 MG tablet Commonly known as:  NAPROSYN   oxybutynin 5 MG tablet Commonly known as:  DITROPAN     TAKE these medications   albuterol 108 (90 Base) MCG/ACT inhaler Commonly known as:  PROVENTIL HFA;VENTOLIN HFA Inhale 1 puff into the lungs daily.   albuterol (2.5 MG/3ML) 0.083% nebulizer solution Commonly known as:  PROVENTIL Take 2.5 mg by nebulization every 6 (six) hours as needed for wheezing or shortness of breath.   ALPRAZolam 0.25 MG tablet Commonly known as:  XANAX Take 0.25 mg by mouth daily as needed for anxiety.   amoxicillin-clavulanate 875-125 MG tablet Commonly known as:  AUGMENTIN Take 1 tablet by mouth 2 (two) times daily.   chlorpheniramine-HYDROcodone 10-8 MG/5ML Suer Commonly known as:  TUSSIONEX Take 5 mLs by mouth every 12 (twelve) hours as needed for cough.   FLOVENT HFA 220 MCG/ACT inhaler Generic drug:  fluticasone Inhale 1 puff into the lungs  daily.   fluconazole 150 MG tablet Commonly known as:  DIFLUCAN Take 1 tablet (150 mg total) by mouth daily for 3 doses.   fluticasone 50 MCG/ACT nasal spray Commonly known as:  FLONASE Place into both nostrils daily.   guaiFENesin 600 MG 12 hr tablet Commonly known as:  MUCINEX Take 1 tablet (600 mg total) by mouth 2 (two) times daily.   hydrocortisone 25 MG suppository Commonly known as:  ANUSOL-HC Place 1 suppository (25 mg total) rectally 2 (two) times daily as needed for hemorrhoids or anal itching.   loratadine 10 MG tablet Commonly known as:  CLARITIN Take 10 mg by mouth daily.   predniSONE 10 MG tablet Commonly known as:  DELTASONE 40 mg x 3 days then 30 mg x 3 days then 20 mg x 3 days then 10 mg x 3  days and stop What changed:    how much to take  how to take this  when to take this  additional instructions  Another medication with the same name was removed. Continue taking this medication, and follow the directions you see here.      Follow-up Information    [name], MD Follow up in 1 week(s).   Specialty:  Internal Medicine Contact information: [address] [place] [id number] [phone]            Time coordinating discharge: 35 min  Signed:  Geradine Girt   [facility] 11/01/2017, 11:47 AM

## 2017-11-01 NOTE — Care Management Note (Signed)
Case Management Note  Patient Details  Name: Joyce Welch MRN: [id number] Date of Birth: [birth year]  Subjective/Objective:     Admitted for Asthma Exacerbation.            Action/Plan: Nasal cannula oxygen as needed- Give 125 mg of Solu-Medrol IV once, and placed on steroid taper in a.m.  - Give 2 g of magnesium sulfate x1. Discharge home in 1-2 days  Expected Discharge Date:  11/01/17               Expected Discharge Plan:  Home/Self Care  In-House Referral:  NA  Discharge planning Services  CM Consult  Status of Service:  Completed, signed off  Additional Comments: Prior to admission patient lived at home alone.  PCP noted.  Patient with discharge orders home today.  No discharge needs noted.  Kristen Cardinal, RN  Nurse case manager [phone] 11/01/2017, 12:32 PM

## 2017-11-01 NOTE — Progress Notes (Signed)
Patient is discharged to home, AVS reviewed, IV removed, patient left floor via wheel chair with staff

## 2018-01-10 ENCOUNTER — Other Ambulatory Visit: Payer: Self-pay | Admitting: Internal Medicine

## 2018-01-10 DIAGNOSIS — Z1231 Encounter for screening mammogram for malignant neoplasm of breast: Secondary | ICD-10-CM

## 2018-01-31 ENCOUNTER — Ambulatory Visit
Admission: RE | Admit: 2018-01-31 | Discharge: 2018-01-31 | Disposition: A | Discharge status: Home / Self Care | Payer: Medicaid Other | Source: Ambulatory Visit | Attending: Internal Medicine | Admitting: Internal Medicine

## 2018-01-31 DIAGNOSIS — Z1231 Encounter for screening mammogram for malignant neoplasm of breast: Secondary | ICD-10-CM

## 2018-02-14 ENCOUNTER — Other Ambulatory Visit: Payer: Self-pay

## 2018-02-14 ENCOUNTER — Ambulatory Visit (HOSPITAL_COMMUNITY)
Admission: EM | Admit: 2018-02-14 | Discharge: 2018-02-14 | Disposition: A | Discharge status: Home / Self Care | Payer: Medicaid Other | Attending: Family Medicine | Admitting: Family Medicine

## 2018-02-14 ENCOUNTER — Encounter (HOSPITAL_COMMUNITY): Payer: Self-pay | Admitting: Emergency Medicine

## 2018-02-14 DIAGNOSIS — H8110 Benign paroxysmal vertigo, unspecified ear: Secondary | ICD-10-CM | POA: Diagnosis not present

## 2018-02-14 LAB — POCT I-STAT, CHEM 8
BUN: 18 mg/dL (ref 6–20)
Calcium, Ion: 1.21 mmol/L (ref 1.15–1.40)
Chloride: 102 mmol/L (ref 101–111)
Creatinine, Ser: 0.9 mg/dL (ref 0.44–1.00)
Glucose, Bld: 81 mg/dL (ref 65–99)
HCT: 40 % (ref 36.0–46.0)
Hemoglobin: 13.6 g/dL (ref 12.0–15.0)
Potassium: 3.7 mmol/L (ref 3.5–5.1)
Sodium: 141 mmol/L (ref 135–145)
TCO2: 28 mmol/L (ref 22–32)

## 2018-02-14 MED ORDER — MECLIZINE HCL 25 MG PO TABS: 25.0000 mg | ORAL_TABLET | Freq: Three times a day (TID) | ORAL | 0 refills | Status: DC | PRN

## 2018-02-14 NOTE — Discharge Instructions (Addendum)
Meclizine prescribed Take as directed and to completion Follow up with PCP if symptoms persists Return or go to ER if you have any new or worsening symptoms

## 2018-02-14 NOTE — ED Provider Notes (Signed)
[place]   [id number] 02/14/18 Arrival Time: 1347  SUBJECTIVE:  Joyce Welch is a 47 y.o. female complains of dizziness for 2 days.  She denies a precipitating event, or trauma.  She describes the dizziness as "the room spinning" and that it is  intermittent with episodes lasting 1-2 hours.  She has tried ASA without relief.  She denies aggravating symptoms. She denies to previous symptoms in the past. Denies occupation exposure or drug exposure.  Denies fever, chills, nausea, vomiting, head trauma, weakness, slurred speech, vision changes, chest pain, SOB, abdominal pain.    At conclusion of visit patient states she is concerned for UTI.  She states this is a chronic condition and has no new symptoms, but request for her urine to be checked.    ROS: As per HPI. Past Medical History:  Diagnosis Date  . Asthma   . Fibroids   . OAB (overactive bladder)    Past Surgical History:  Procedure Laterality Date  . ABDOMINAL HYSTERECTOMY  02/05/2006   partial  . [place], 2002, 2005,    x 3   No Known Allergies No current facility-administered medications on file prior to encounter.    Current Outpatient Medications on File Prior to Encounter  Medication Sig Dispense Refill  . albuterol (PROVENTIL HFA;VENTOLIN HFA) 108 (90 BASE) MCG/ACT inhaler Inhale 1 puff into the lungs daily.     Marland Kitchen albuterol (PROVENTIL) (2.5 MG/3ML) 0.083% nebulizer solution Take 2.5 mg by nebulization every 6 (six) hours as needed for wheezing or shortness of breath.    Cristy Friedlander HFA 220 MCG/ACT inhaler Inhale 1 puff into the lungs daily.  0  . fluticasone (FLONASE) 50 MCG/ACT nasal spray Place into both nostrils daily.    Marland Kitchen loratadine (CLARITIN) 10 MG tablet Take 10 mg by mouth daily.    Marland Kitchen ALPRAZolam (XANAX) 0.25 MG tablet Take 0.25 mg by mouth daily as needed for anxiety.   0  . amoxicillin-clavulanate (AUGMENTIN) 875-125 MG tablet Take 1 tablet by mouth 2 (two) times daily. 10 tablet 0  .  chlorpheniramine-HYDROcodone (TUSSIONEX) 10-8 MG/5ML SUER Take 5 mLs by mouth every 12 (twelve) hours as needed for cough. 115 mL 0  . guaiFENesin (MUCINEX) 600 MG 12 hr tablet Take 1 tablet (600 mg total) by mouth 2 (two) times daily. 14 tablet 0  . hydrocortisone (ANUSOL-HC) 25 MG suppository Place 1 suppository (25 mg total) rectally 2 (two) times daily as needed for hemorrhoids or anal itching. 12 suppository 0  . predniSONE (DELTASONE) 10 MG tablet 40 mg x 3 days then 30 mg x 3 days then 20 mg x 3 days then 10 mg x 3 days and stop 30 tablet 0   Social History   Socioeconomic History  . Marital status: Single    Spouse name: Not on file  . Number of children: Not on file  . Years of education: Not on file  . Highest education level: Not on file  Occupational History  . Not on file  Social Needs  . Financial resource strain: Not on file  . Food insecurity:    Worry: Not on file    Inability: Not on file  . Transportation needs:    Medical: Not on file    Non-medical: Not on file  Tobacco Use  . Smoking status: Never Smoker  . Smokeless tobacco: Never Used  Substance and Sexual Activity  . Alcohol use: Yes  . Drug use: No  . Sexual  activity: Yes    Partners: Male    Birth control/protection: Surgical  Lifestyle  . Physical activity:    Days per week: Not on file    Minutes per session: Not on file  . Stress: Not on file  Relationships  . Social connections:    Talks on phone: Not on file    Gets together: Not on file    Attends religious service: Not on file    Active member of club or organization: Not on file    Attends meetings of clubs or organizations: Not on file    Relationship status: Not on file  . Intimate partner violence:    Fear of current or ex partner: Not on file    Emotionally abused: Not on file    Physically abused: Not on file    Forced sexual activity: Not on file  Other Topics Concern  . Not on file  Social History Narrative  . Not on file     Family History  Problem Relation Age of Onset  . Breast cancer Maternal Aunt     OBJECTIVE:  Vitals:   02/14/18 1410  BP: 139/83  Pulse: 85  Temp: 97.9 F (36.6 C)  TempSrc: Oral  SpO2: 100%    General appearance: alert; no distress Eyes: PERRLA; EOMI; conjunctiva normal HENT: normocephalic; atraumatic; TMs normal; nasal mucosa normal; oral mucosa normal Hallpike: positive when head is turned to the left with subtle nystagmus Neck: supple with FROM Lungs: clear to auscultation bilaterally Heart: regular rate and rhythm Abdomen: soft, non-tender; bowel sounds normal Extremities: no cyanosis or edema; symmetrical with no gross deformities Skin: warm and dry Neurologic: normal gait; CN 2-12 grossly intact; sensation and strength equal and intact bilaterally, Finger to nose without difficulty; Negative Romberg Psychological: alert and cooperative; normal mood and affect  Investigations: Results for orders placed or performed during the hospital encounter of 02/14/18  I-STAT, chem 8  Result Value Ref Range   Sodium 141 135 - 145 mmol/L   Potassium 3.7 3.5 - 5.1 mmol/L   Chloride 102 101 - 111 mmol/L   BUN 18 6 - 20 mg/dL   Creatinine, Ser 0.90 0.44 - 1.00 mg/dL   Glucose, Bld 81 65 - 99 mg/dL   Calcium, Ion 1.21 1.15 - 1.40 mmol/L   TCO2 28 22 - 32 mmol/L   Hemoglobin 13.6 12.0 - 15.0 g/dL   HCT 40.0 36.0 - 46.0 %   Labs Reviewed  URINE CULTURE  POCT I-STAT, CHEM 8   No results found.  EKG: Regular rate and rhythm without obvious ST elevation, depressions or T-wave inversions.  Normal LAD.  Comparable to previous EKG done in January of this year.  Reviewed with Dr. Mannie Stabile.    ASSESSMENT & PLAN:  1. Benign paroxysmal positional vertigo, unspecified laterality     Meds ordered this encounter  Medications  . meclizine (ANTIVERT) 25 MG tablet    Sig: Take 1 tablet (25 mg total) by mouth 3 (three) times daily as needed for dizziness.    Dispense:  30 tablet     Refill:  0    Order Specific Question:   Supervising Provider    Answer:   Wynona Luna [[id number]]   Meclizine prescribed Take as directed and to completion Follow up with PCP if symptoms persists Return or go to ER if you have any new or worsening symptoms  Urine dipstick not performed.    Reviewed expectations re: course of current medical issues.  Questions answered. Outlined signs and symptoms indicating need for more acute intervention. Patient verbalized understanding. After Visit Summary given.    Lestine Box, PA-C 02/14/18 2041

## 2018-02-14 NOTE — ED Triage Notes (Signed)
Pt reports dizziness that started yesterday.

## 2018-05-02 ENCOUNTER — Ambulatory Visit (INDEPENDENT_AMBULATORY_CARE_PROVIDER_SITE_OTHER): Payer: Self-pay | Admitting: Orthopaedic Surgery

## 2018-05-15 ENCOUNTER — Encounter (HOSPITAL_COMMUNITY): Payer: Self-pay | Admitting: Emergency Medicine

## 2018-05-15 ENCOUNTER — Ambulatory Visit (HOSPITAL_COMMUNITY)
Admission: EM | Admit: 2018-05-15 | Discharge: 2018-05-15 | Disposition: A | Discharge status: Home / Self Care | Payer: Medicaid Other | Attending: Family Medicine | Admitting: Family Medicine

## 2018-05-15 DIAGNOSIS — R05 Cough: Secondary | ICD-10-CM

## 2018-05-15 DIAGNOSIS — R0981 Nasal congestion: Secondary | ICD-10-CM

## 2018-05-15 DIAGNOSIS — J45901 Unspecified asthma with (acute) exacerbation: Secondary | ICD-10-CM

## 2018-05-15 DIAGNOSIS — R0602 Shortness of breath: Secondary | ICD-10-CM | POA: Diagnosis not present

## 2018-05-15 MED ORDER — AMOXICILLIN-POT CLAVULANATE 875-125 MG PO TABS: 1.0000 | ORAL_TABLET | Freq: Two times a day (BID) | ORAL | 0 refills | Status: DC

## 2018-05-15 MED ORDER — FLOVENT HFA 220 MCG/ACT IN AERO: 1.0000 | INHALATION_SPRAY | Freq: Every day | RESPIRATORY_TRACT | 0 refills | Status: DC

## 2018-05-15 MED ORDER — METHYLPREDNISOLONE SODIUM SUCC 125 MG IJ SOLR
80.0000 mg | Freq: Once | INTRAMUSCULAR | Status: AC
  Administered 2018-05-15: 80 mg via INTRAMUSCULAR

## 2018-05-15 MED ORDER — METHYLPREDNISOLONE SODIUM SUCC 125 MG IJ SOLR
INTRAMUSCULAR | Status: AC
  Filled 2018-05-15: qty 2

## 2018-05-15 NOTE — ED Provider Notes (Signed)
[facility]    CSN: [id number] Arrival date & time: 05/15/18  1017     History   Chief Complaint Chief Complaint  Patient presents with  . Asthma    HPI Joyce Welch is a 47 y.o. female.   Patient presents with history of asthma and recent exacerbation.  Her maintenance medications include albuterol.  She just finished a course of oral prednisone but still continues to cough and wheeze.  She had formally been on Breo when samples were provided but she is out of that.  Also previously used Flovent but does not have that either.  Her cough is largely nonproductive.  She also denies any reflux symptoms. HPI  Past Medical History:  Diagnosis Date  . Asthma   . Fibroids   . OAB (overactive bladder)     Patient Active Problem List   Diagnosis Date Noted  . Asthma exacerbation 10/31/2017  . Leukocytosis 10/31/2017  . Strain of quadriceps tendon 02/14/2014  . Symptomatic menopausal or female climacteric states 07/31/2013    Past Surgical History:  Procedure Laterality Date  . ABDOMINAL HYSTERECTOMY  02/05/2006   partial  . [place], 2002, 2005,    x 3    OB History    Gravida  3   Para  3   Term  1   Preterm      AB      Living  3     SAB      TAB      Ectopic      Multiple      Live Births  3            Home Medications    Prior to Admission medications   Medication Sig Start Date End Date Taking? Authorizing Provider  albuterol (PROVENTIL HFA;VENTOLIN HFA) 108 (90 BASE) MCG/ACT inhaler Inhale 1 puff into the lungs daily.    Yes [provider]  albuterol (PROVENTIL) (2.5 MG/3ML) 0.083% nebulizer solution Take 2.5 mg by nebulization every 6 (six) hours as needed for wheezing or shortness of breath.   Yes [provider]  FLOVENT HFA 220 MCG/ACT inhaler Inhale 1 puff into the lungs daily. 10/15/17  Yes [provider]  fluticasone (FLONASE) 50 MCG/ACT nasal spray Place into both nostrils daily.    Yes [provider]  loratadine (CLARITIN) 10 MG tablet Take 10 mg by mouth daily.   Yes [provider]  meclizine (ANTIVERT) 25 MG tablet Take 1 tablet (25 mg total) by mouth 3 (three) times daily as needed for dizziness. 02/14/18  Yes Wurst, [place], PA-C  ALPRAZolam (XANAX) 0.25 MG tablet Take 0.25 mg by mouth daily as needed for anxiety.  10/29/17   [provider]  amoxicillin-clavulanate (AUGMENTIN) 875-125 MG tablet Take 1 tablet by mouth 2 (two) times daily. 11/01/17   Geradine Girt, DO  chlorpheniramine-HYDROcodone (TUSSIONEX) 10-8 MG/5ML SUER Take 5 mLs by mouth every 12 (twelve) hours as needed for cough. 11/01/17   Geradine Girt, DO  guaiFENesin (MUCINEX) 600 MG 12 hr tablet Take 1 tablet (600 mg total) by mouth 2 (two) times daily. 11/01/17   Geradine Girt, DO  hydrocortisone (ANUSOL-HC) 25 MG suppository Place 1 suppository (25 mg total) rectally 2 (two) times daily as needed for hemorrhoids or anal itching. 11/01/17   Geradine Girt, DO  predniSONE (DELTASONE) 10 MG tablet 40 mg x 3 days then 30 mg x 3 days then 20 mg x  3 days then 10 mg x 3 days and stop 11/01/17   Geradine Girt, DO    Family History Family History  Problem Relation Age of Onset  . Breast cancer Maternal Aunt     Social History Social History   Tobacco Use  . Smoking status: Never Smoker  . Smokeless tobacco: Never Used  Substance Use Topics  . Alcohol use: Yes  . Drug use: No     Allergies   Patient has no known allergies.   Review of Systems Review of Systems  Constitutional: Negative.   HENT: Positive for congestion, rhinorrhea and sinus pain.   Respiratory: Positive for cough and shortness of breath.   Cardiovascular: Negative.   Gastrointestinal: Negative.   Neurological: Negative.      Physical Exam Triage Vital Signs ED Triage Vitals [05/15/18 1035]  Enc Vitals Group     BP (!) 146/82     Pulse Rate 89     Resp (!) 24     Temp 98.1 F (36.7 C)       Temp Source Oral     SpO2 100 %     Weight      Height      Head Circumference      Peak Flow      Pain Score      Pain Loc      Pain Edu?      Excl. in [place]?    No data found.  Updated Vital Signs BP (!) 146/82 (BP Location: Left Arm)   Pulse 89   Temp 98.1 F (36.7 C) (Oral)   Resp (!) 24   SpO2 100%   Visual Acuity Right Eye Distance:   Left Eye Distance:   Bilateral Distance:    Right Eye Near:   Left Eye Near:    Bilateral Near:     Physical Exam  Constitutional: She appears well-developed and well-nourished.  HENT:  Head: Normocephalic.  Neck: Normal range of motion.  Cardiovascular: Normal rate and regular rhythm.  Pulmonary/Chest: Effort normal and breath sounds normal. No respiratory distress. She has no wheezes.     UC Treatments / Results  Labs (all labs ordered are listed, but only abnormal results are displayed) Labs Reviewed - No data to display  EKG None  Radiology No results found.  Procedures Procedures (including critical care time)  Medications Ordered in UC Medications - No data to display  Initial Impression / Assessment and Plan / UC Course  I have reviewed the triage vital signs and the nursing notes.  Pertinent labs & imaging results that were available during my care of the patient were reviewed by me and considered in my medical decision making (see chart for details).     Asthma.  Recent exacerbation but without relief after use of steroids.  I suspect there may be some occult infection such as in her sinus.  Also may have a viral bronchitis even though her lungs sound relatively clear today. Final Clinical Impressions(s) / UC Diagnoses   Final diagnoses:  None   Discharge Instructions   None    ED Prescriptions    None     Controlled Substance Prescriptions [place] Controlled Substance Registry consulted? No   Wardell Honour, MD 05/15/18 1056

## 2018-05-15 NOTE — ED Triage Notes (Signed)
Pt c/o asthma flair up x3 weeks, has seen her PCP and given nebulizer and steroid without relief.

## 2018-05-18 ENCOUNTER — Other Ambulatory Visit: Payer: Self-pay

## 2018-05-18 ENCOUNTER — Emergency Department (HOSPITAL_COMMUNITY): Payer: Medicaid Other

## 2018-05-18 ENCOUNTER — Emergency Department (HOSPITAL_COMMUNITY)
Admission: EM | Admit: 2018-05-18 | Discharge: 2018-05-19 | Disposition: A | Discharge status: Home / Self Care | Payer: Medicaid Other | Attending: Emergency Medicine | Admitting: Emergency Medicine

## 2018-05-18 ENCOUNTER — Encounter (HOSPITAL_COMMUNITY): Payer: Self-pay | Admitting: Emergency Medicine

## 2018-05-18 DIAGNOSIS — Z79899 Other long term (current) drug therapy: Secondary | ICD-10-CM | POA: Insufficient documentation

## 2018-05-18 DIAGNOSIS — J45909 Unspecified asthma, uncomplicated: Secondary | ICD-10-CM | POA: Insufficient documentation

## 2018-05-18 DIAGNOSIS — R05 Cough: Secondary | ICD-10-CM | POA: Diagnosis present

## 2018-05-18 DIAGNOSIS — J209 Acute bronchitis, unspecified: Secondary | ICD-10-CM | POA: Insufficient documentation

## 2018-05-18 LAB — URINALYSIS, ROUTINE W REFLEX MICROSCOPIC
Bacteria, UA: NONE SEEN
Bilirubin Urine: NEGATIVE
Glucose, UA: NEGATIVE mg/dL
Ketones, ur: NEGATIVE mg/dL
Leukocytes, UA: NEGATIVE
Nitrite: NEGATIVE
Protein, ur: NEGATIVE mg/dL
Specific Gravity, Urine: 1.019 (ref 1.005–1.030)
pH: 6 (ref 5.0–8.0)

## 2018-05-18 LAB — COMPREHENSIVE METABOLIC PANEL
ALT: 17 U/L (ref 0–44)
AST: 23 U/L (ref 15–41)
Albumin: 3.2 g/dL — ABNORMAL LOW (ref 3.5–5.0)
Alkaline Phosphatase: 88 U/L (ref 38–126)
Anion gap: 7 (ref 5–15)
BUN: 10 mg/dL (ref 6–20)
CO2: 29 mmol/L (ref 22–32)
Calcium: 8.9 mg/dL (ref 8.9–10.3)
Chloride: 105 mmol/L (ref 98–111)
Creatinine, Ser: 1.02 mg/dL — ABNORMAL HIGH (ref 0.44–1.00)
GFR calc Af Amer: >60 mL/min (ref >60)
GFR calc non Af Amer: >60 mL/min (ref >60)
Glucose, Bld: 113 mg/dL — ABNORMAL HIGH (ref 70–99)
Potassium: 3.8 mmol/L (ref 3.5–5.1)
Sodium: 141 mmol/L (ref 135–145)
Total Bilirubin: 0.5 mg/dL (ref 0.3–1.2)
Total Protein: 6.6 g/dL (ref 6.5–8.1)

## 2018-05-18 LAB — CBC WITH DIFFERENTIAL/PLATELET
Abs Immature Granulocytes: 0.1 10*3/uL (ref 0.0–0.1)
Basophils Absolute: 0 10*3/uL (ref 0.0–0.1)
Basophils Relative: 0 %
Eosinophils Absolute: 0.3 10*3/uL (ref 0.0–0.7)
Eosinophils Relative: 2 %
HCT: 40.3 % (ref 36.0–46.0)
Hemoglobin: 12.1 g/dL (ref 12.0–15.0)
Immature Granulocytes: 1 %
Lymphocytes Relative: 25 %
Lymphs Abs: 3.4 10*3/uL (ref 0.7–4.0)
MCH: 25.3 pg — ABNORMAL LOW (ref 26.0–34.0)
MCHC: 30 g/dL (ref 30.0–36.0)
MCV: 84.1 fL (ref 78.0–100.0)
Monocytes Absolute: 0.8 10*3/uL (ref 0.1–1.0)
Monocytes Relative: 6 %
Neutro Abs: 9 10*3/uL — ABNORMAL HIGH (ref 1.7–7.7)
Neutrophils Relative %: 66 %
Platelets: 292 10*3/uL (ref 150–400)
RBC: 4.79 MIL/uL (ref 3.87–5.11)
RDW: 12.8 % (ref 11.5–15.5)
WBC: 13.6 10*3/uL — ABNORMAL HIGH (ref 4.0–10.5)

## 2018-05-18 LAB — I-STAT BETA HCG BLOOD, ED (MC, WL, AP ONLY): I-stat hCG, quantitative: <5 m[IU]/mL (ref <5)

## 2018-05-18 LAB — I-STAT CG4 LACTIC ACID, ED: Lactic Acid, Venous: 1.87 mmol/L (ref 0.5–1.9)

## 2018-05-18 MED ORDER — AZITHROMYCIN 250 MG PO TABS: 250.0000 mg | ORAL_TABLET | Freq: Every day | ORAL | 0 refills | Status: DC

## 2018-05-18 MED ORDER — BENZONATATE 100 MG PO CAPS: 100.0000 mg | ORAL_CAPSULE | Freq: Three times a day (TID) | ORAL | 0 refills | Status: DC | PRN

## 2018-05-18 MED ORDER — BENZONATATE 100 MG PO CAPS
200.0000 mg | ORAL_CAPSULE | Freq: Once | ORAL | Status: AC
Start: 2018-05-18 — End: 2018-05-18
  Administered 2018-05-18: 200 mg via ORAL
  Filled 2018-05-18: qty 2

## 2018-05-18 NOTE — Discharge Instructions (Signed)
If no improvement take antibiotics for 5 days. Follow-up with your primary doctor as discussed. Return to the emergency room for significant shortness of breath, passing out, chest pain or new concerns.

## 2018-05-18 NOTE — ED Triage Notes (Signed)
Pt reports on going cough x 2 weeks, copious yellow and green sputum with solid brown specks.  Pt c/o back aches, difficulty sleeping, sore throat with difficulty swallowing.  Pt denies fever. Pt seen for same at urgent care, taking abx and neb tx without relief.

## 2018-05-18 NOTE — ED Provider Notes (Signed)
[facility] EMERGENCY DEPARTMENT Provider Note   CSN: [id number] Arrival date & time: 05/18/18  1159     History   Chief Complaint Chief Complaint  Patient presents with  . Cough  . Shortness of Breath    HPI Joyce Welch is a 47 y.o. female.  Patient presents with persistent cough congestion productive cough body aches for the past 2 weeks.  Patient initially was placed on steroids followed by course of antibiotics for which she could not complete due to capsules.  No improvement.  Patient does have asthma history overall controlled she has albuterol at home.  No current fevers.  No chest pain or significant shortness of breath.  Non-smoker     Past Medical History:  Diagnosis Date  . Asthma   . Fibroids   . OAB (overactive bladder)     Patient Active Problem List   Diagnosis Date Noted  . Asthma exacerbation 10/31/2017  . Leukocytosis 10/31/2017  . Strain of quadriceps tendon 02/14/2014  . Symptomatic menopausal or female climacteric states 07/31/2013    Past Surgical History:  Procedure Laterality Date  . ABDOMINAL HYSTERECTOMY  02/05/2006   partial  . [place], 2002, 2005,    x 3     OB History    Gravida  3   Para  3   Term  1   Preterm      AB      Living  3     SAB      TAB      Ectopic      Multiple      Live Births  3            Home Medications    Prior to Admission medications   Medication Sig Start Date End Date Taking? Authorizing Provider  albuterol (PROVENTIL HFA;VENTOLIN HFA) 108 (90 BASE) MCG/ACT inhaler Inhale 1 puff into the lungs daily.     [provider]  albuterol (PROVENTIL) (2.5 MG/3ML) 0.083% nebulizer solution Take 2.5 mg by nebulization every 6 (six) hours as needed for wheezing or shortness of breath.    [provider]  ALPRAZolam Duanne Moron) 0.25 MG tablet Take 0.25 mg by mouth daily as needed for anxiety.  10/29/17   [provider]    amoxicillin-clavulanate (AUGMENTIN) 875-125 MG tablet Take 1 tablet by mouth 2 (two) times daily. 05/15/18   Wardell Honour, MD  azithromycin (ZITHROMAX) 250 MG tablet Take 1 tablet (250 mg total) by mouth daily. Take first 2 tablets together, then 1 every day until finished. 05/18/18   Elnora Morrison, MD  benzonatate (TESSALON) 100 MG capsule Take 1 capsule (100 mg total) by mouth 3 (three) times daily as needed for cough. 05/18/18   Elnora Morrison, MD  chlorpheniramine-HYDROcodone (TUSSIONEX) 10-8 MG/5ML SUER Take 5 mLs by mouth every 12 (twelve) hours as needed for cough. 11/01/17   Geradine Girt, DO  FLOVENT HFA 220 MCG/ACT inhaler Inhale 1 puff into the lungs daily. 05/15/18   Wardell Honour, MD  fluticasone Asencion Islam) 50 MCG/ACT nasal spray Place into both nostrils daily.    [provider]  guaiFENesin (MUCINEX) 600 MG 12 hr tablet Take 1 tablet (600 mg total) by mouth 2 (two) times daily. 11/01/17   Geradine Girt, DO  hydrocortisone (ANUSOL-HC) 25 MG suppository Place 1 suppository (25 mg total) rectally 2 (two) times daily as needed for hemorrhoids or anal itching. 11/01/17   Geradine Girt,  DO  loratadine (CLARITIN) 10 MG tablet Take 10 mg by mouth daily.    [provider]  meclizine (ANTIVERT) 25 MG tablet Take 1 tablet (25 mg total) by mouth 3 (three) times daily as needed for dizziness. 02/14/18   Wurst, [place], PA-C  predniSONE (DELTASONE) 10 MG tablet 40 mg x 3 days then 30 mg x 3 days then 20 mg x 3 days then 10 mg x 3 days and stop 11/01/17   Geradine Girt, DO    Family History Family History  Problem Relation Age of Onset  . Breast cancer Maternal Aunt     Social History Social History   Tobacco Use  . Smoking status: Never Smoker  . Smokeless tobacco: Never Used  Substance Use Topics  . Alcohol use: Yes  . Drug use: No     Allergies   Patient has no known allergies.   Review of Systems Review of Systems  Constitutional: Positive for  appetite change, chills and fever.  HENT: Positive for congestion.   Eyes: Negative for visual disturbance.  Respiratory: Positive for cough and shortness of breath.   Cardiovascular: Negative for chest pain.  Gastrointestinal: Negative for abdominal pain and vomiting.  Genitourinary: Negative for dysuria and flank pain.  Musculoskeletal: Negative for back pain, neck pain and neck stiffness.  Skin: Negative for rash.  Neurological: Negative for light-headedness and headaches.     Physical Exam Updated Vital Signs BP 136/75 (BP Location: Left Arm)   Pulse 98   Temp 98.7 F (37.1 C) (Oral)   Resp (!) 22   SpO2 100%   Physical Exam  Constitutional: She is oriented to person, place, and time. She appears well-developed and well-nourished.  HENT:  Head: Normocephalic and atraumatic.  Nasal congestion, no signs of peritonsillar abscess neck supple no significant lymphadenopathy  Eyes: Conjunctivae are normal. Right eye exhibits no discharge. Left eye exhibits no discharge.  Neck: Normal range of motion. Neck supple. No tracheal deviation present.  Cardiovascular: Normal rate and regular rhythm.  Pulmonary/Chest: Effort normal and breath sounds normal. She has no decreased breath sounds.  Abdominal: Soft. She exhibits no distension. There is no tenderness. There is no guarding.  Musculoskeletal: She exhibits no edema.  Neurological: She is alert and oriented to person, place, and time.  Skin: Skin is warm. No rash noted.  Psychiatric: She has a normal mood and affect.  Nursing note and vitals reviewed.    ED Treatments / Results  Labs (all labs ordered are listed, but only abnormal results are displayed) Labs Reviewed  COMPREHENSIVE METABOLIC PANEL - Abnormal; Notable for the following components:      Result Value   Glucose, Bld 113 (*)    Creatinine, Ser 1.02 (*)    Albumin 3.2 (*)    All other components within normal limits  CBC WITH DIFFERENTIAL/PLATELET - Abnormal;  Notable for the following components:   WBC 13.6 (*)    MCH 25.3 (*)    Neutro Abs 9.0 (*)    All other components within normal limits  URINALYSIS, ROUTINE W REFLEX MICROSCOPIC - Abnormal; Notable for the following components:   APPearance HAZY (*)    Hgb urine dipstick SMALL (*)    All other components within normal limits  I-STAT CG4 LACTIC ACID, ED  I-STAT BETA HCG BLOOD, ED ([facility], [facility], [facility] ONLY)  I-STAT CG4 LACTIC ACID, ED    EKG None  Radiology Dg Chest 2 View  Result Date: 05/18/2018 CLINICAL DATA:  Cough 2 weeks with sore throat and difficulty swallowing. Back aches. EXAM: CHEST - 2 VIEW COMPARISON:  10/30/2017 FINDINGS: Lungs are adequately inflated without focal airspace consolidation or effusion. Cardiomediastinal silhouette, bones and soft tissues are unchanged. IMPRESSION: No active cardiopulmonary disease. Electronically Signed   By: Marin Olp M.D.   On: 05/18/2018 13:22    Procedures Procedures (including critical care time)  Medications Ordered in ED Medications  benzonatate (TESSALON) capsule 200 mg (has no administration in time range)     Initial Impression / Assessment and Plan / ED Course  I have reviewed the triage vital signs and the nursing notes.  Pertinent labs & imaging results that were available during my care of the patient were reviewed by me and considered in my medical decision making (see chart for details).    Patient with asthma presents with worsening cough congestion.  Discussed likely bronchitis from viral origin however with asthma history and worsening/persistent symptoms discussed trial of azithromycin if no improvement in 48 hours.  Patient has a primary doctor to follow-up with.  Blood work reviewed mild leukocytosis otherwise unremarkable. HR improved in ED, pt has fup for bp outpt. No signs of acute CHF on exam or cxr.  No blood clot risk factors.   Results and differential diagnosis were discussed with the  patient/parent/guardian. Xrays were independently reviewed by myself.  Close follow up outpatient was discussed, comfortable with the plan.   Medications  benzonatate (TESSALON) capsule 200 mg (has no administration in time range)    Vitals:   05/18/18 1210 05/18/18 1416 05/18/18 1618 05/18/18 1705  BP: (!) 184/81 (!) 151/68 (!) 145/68 136/75  Pulse: (!) 110 97 83 98  Resp: (!) 24 18 18  (!) 22  Temp: 97.9 F (36.6 C)   98.7 F (37.1 C)  TempSrc: Oral   Oral  SpO2: 98% 100% 100% 100%    Final diagnoses:  Acute bronchitis, unspecified organism    Final Clinical Impressions(s) / ED Diagnoses   Final diagnoses:  Acute bronchitis, unspecified organism    ED Discharge Orders         Ordered    azithromycin (ZITHROMAX) 250 MG tablet  Daily     05/18/18 1727    benzonatate (TESSALON) 100 MG capsule  3 times daily PRN     05/18/18 1727           Elnora Morrison, MD 05/18/18 1731

## 2018-05-21 ENCOUNTER — Other Ambulatory Visit (INDEPENDENT_AMBULATORY_CARE_PROVIDER_SITE_OTHER): Payer: Self-pay

## 2018-05-21 ENCOUNTER — Ambulatory Visit (INDEPENDENT_AMBULATORY_CARE_PROVIDER_SITE_OTHER): Payer: Medicaid Other | Admitting: Orthopaedic Surgery

## 2018-05-21 ENCOUNTER — Ambulatory Visit (INDEPENDENT_AMBULATORY_CARE_PROVIDER_SITE_OTHER): Payer: Medicaid Other

## 2018-05-21 ENCOUNTER — Encounter (INDEPENDENT_AMBULATORY_CARE_PROVIDER_SITE_OTHER): Payer: Self-pay | Admitting: Orthopaedic Surgery

## 2018-05-21 DIAGNOSIS — M25552 Pain in left hip: Secondary | ICD-10-CM | POA: Diagnosis not present

## 2018-05-21 DIAGNOSIS — M7062 Trochanteric bursitis, left hip: Secondary | ICD-10-CM | POA: Diagnosis not present

## 2018-05-21 MED ORDER — DICLOFENAC SODIUM 1 % TD GEL: 2.0000 g | Freq: Four times a day (QID) | TRANSDERMAL | 3 refills | Status: DC

## 2018-05-21 NOTE — Progress Notes (Signed)
Office Visit Note   Patient: Joyce Welch           Date of Birth: [birth year]           MRN: [id number] Visit Date: 05/21/2018              Requested by: Joyce Ebbs, MD [address] [place], [place] [address] PCP: Joyce Ebbs, MD   Assessment & Plan: Visit Diagnoses:  1. Pain in left hip   2. Trochanteric bursitis, left hip     Plan: Right now this seems to be more of a trochanteric bursitis than anything.  I showed her stretching exercises and had her demonstrate these back to me.  She is a perfect candidate to try Voltaren gel in this area as well as activity modification and outpatient physical therapy.  She is not interested in injection right now so we will see how these other modalities work for her and see her back in a month.  She is continued to have problems she will consider a steroid injection and.  All questions concerns were answered and addressed.  Follow-Up Instructions: Return in about 4 weeks (around 06/18/2018).   Orders:  Orders Placed This Encounter  Procedures  . XR HIP UNILAT W OR W/O PELVIS 1V LEFT   Meds ordered this encounter  Medications  . diclofenac sodium (VOLTAREN) 1 % GEL    Sig: Apply 2 g topically 4 (four) times daily.    Dispense:  100 g    Refill:  3      Procedures: No procedures performed   Clinical Data: No additional findings.   Subjective: Chief Complaint  Patient presents with  . Left Hip - Pain  Patient is a very pleasant 47 year old female who unfortunately has significant asthma.  She is developed left hip pain for several months now it hurts some in the groin but mainly on the lateral aspect of her hip.  She sleeps mainly on her back but with flares of her asthma and coughing at night has caused her to become a side sleeper on her left side.  She points mainly to the trochanteric area of her left hip as source of her pain.  She denies any specific injuries to the area.  She is moderately obese.  HPI  Review of  Systems She currently denies any shortness of breath today.  She has had a flareup of asthma over the weekend.  She had shots for this.  She denies any chest pain, nausea, vomiting, fever, chills, headache  Objective: Vital Signs: There were no vitals taken for this visit.  Physical Exam She is alert and oriented x3 and in no acute distress Ortho Exam She is able to get up on the exam table easily.  I can put both hips through full internal and external rotation with minimal pain and discomfort in the groin area but mainly on the trochanteric area on the left side.  She has pain to palpation of the trunk on the left hip and down the IT band. Specialty Comments:  No specialty comments available.  Imaging: Xr Hip Unilat W Or W/o Pelvis 1v Left  Result Date: 05/21/2018 An AP pelvis and lateral of the left hip shows a well-maintained joint space.  There is slight overhanging osteophyte of the acetabulum but otherwise no acute findings.    PMFS History: Patient Active Problem List   Diagnosis Date Noted  . Asthma exacerbation 10/31/2017  . Leukocytosis 10/31/2017  . Strain of  quadriceps tendon 02/14/2014  . Symptomatic menopausal or female climacteric states 07/31/2013   Past Medical History:  Diagnosis Date  . Asthma   . Fibroids   . OAB (overactive bladder)     Family History  Problem Relation Age of Onset  . Breast cancer Maternal Aunt     Past Surgical History:  Procedure Laterality Date  . ABDOMINAL HYSTERECTOMY  02/05/2006   partial  . [place], 2002, 2005,    x 3   Social History   Occupational History  . Not on file  Tobacco Use  . Smoking status: Never Smoker  . Smokeless tobacco: Never Used  Substance and Sexual Activity  . Alcohol use: Yes  . Drug use: No  . Sexual activity: Yes    Partners: Male    Birth control/protection: Surgical

## 2018-06-04 ENCOUNTER — Encounter: Payer: Self-pay | Admitting: Pulmonary Disease

## 2018-06-04 ENCOUNTER — Ambulatory Visit (INDEPENDENT_AMBULATORY_CARE_PROVIDER_SITE_OTHER): Payer: Medicaid Other | Admitting: Pulmonary Disease

## 2018-06-04 ENCOUNTER — Other Ambulatory Visit: Payer: Self-pay | Admitting: Pulmonary Disease

## 2018-06-04 VITALS — BP 138/80 | HR 104 | Ht 66.0 in | Wt 242.3 lb

## 2018-06-04 DIAGNOSIS — R059 Cough, unspecified: Secondary | ICD-10-CM

## 2018-06-04 DIAGNOSIS — R05 Cough: Secondary | ICD-10-CM | POA: Diagnosis not present

## 2018-06-04 MED ORDER — AZELASTINE HCL 0.1 % NA SOLN: 1.0000 | Freq: Two times a day (BID) | NASAL | 12 refills | Status: AC

## 2018-06-04 MED ORDER — OMEPRAZOLE 40 MG PO CPDR: 40.0000 mg | DELAYED_RELEASE_CAPSULE | Freq: Every day | ORAL | 1 refills | Status: DC

## 2018-06-04 MED ORDER — MONTELUKAST SODIUM 10 MG PO TABS: 10.0000 mg | ORAL_TABLET | Freq: Every day | ORAL | 5 refills | Status: DC

## 2018-06-04 NOTE — Progress Notes (Signed)
   Subjective:    Patient ID: Joyce Welch, female    DOB: Mar 22, 1971, 47 y.o.   MRN: [id number]  HPI    Review of Systems  Constitutional: Negative for fever and unexpected weight change.  HENT: Positive for congestion, postnasal drip, sinus pressure, sneezing, sore throat, trouble swallowing and voice change. Negative for dental problem, ear pain, nosebleeds and rhinorrhea.   Eyes: Negative for redness and itching.  Respiratory: Positive for cough, chest tightness, shortness of breath and wheezing.   Cardiovascular: Negative for palpitations and leg swelling.  Gastrointestinal: Negative for nausea and vomiting.  Genitourinary: Negative for dysuria.  Musculoskeletal: Negative for joint swelling.  Skin: Negative for rash.  Allergic/Immunologic: Negative.  Negative for environmental allergies, food allergies and immunocompromised state.  Neurological: Negative for headaches.  Hematological: Does not bruise/bleed easily.  Psychiatric/Behavioral: Negative for dysphoric mood. The patient is nervous/anxious.        Objective:   Physical Exam        Assessment & Plan:

## 2018-06-04 NOTE — Patient Instructions (Signed)
Saline nasal spray nightly Flonase 1 spray in each nostril nightly Azelastine 1 spray in each nostril in the morning and 1 spray in each nostril at night Montelukast 10 mg pill nightly Omeprazole 40 mg pill daily, and take 30 minutes before your first meal of the day Sip water when you have the urge to cough Uses sugarless candy to keep your mouth moist Do salt water gargles daily Avoid forcing a cough, or clearing your throat Continue breo one puff daily, and rinse mouth after each use Albuterol two puffs every 6 hours as needed for chest congestion, cough, or wheezing  Follow up in 2 weeks with Dr. Halford Chessman or Nurse Practitioner

## 2018-06-04 NOTE — Progress Notes (Signed)
[facility]  Chief Complaint  Patient presents with  . pulm consult    Pt is self referral for productive cough- yellow/clear, wheezing, SOB with exertion. Pt uses neb meds and rescue inhaler along with Breo and is still not breatihng well.    Constitutional: BP 138/80 (BP Location: Left Arm, Cuff Size: Normal)   Pulse (!) 104   Ht 5\' 6"  (1.676 m)   Wt 242 lb 4.8 oz (109.9 kg)   SpO2 99%   BMI 39.11 kg/m   History of Present Illness: Joyce Welch is a 47 y.o. female with cough.  She has history of asthma.  She developed a cough about 4 weeks ago.  She has been on antibiotics and prednisone.  Her cough has continued.  She feels like something is stuck in her throat.  She will cough up green sputum.  She has some blood streaks in this sometimes.  Her sinus are congested and she gets post nasal drip.  The drainage wakes her up at night.  She gets sore in her chest when she coughs.  She is not having heart burn or abdominal pain.  She works with [employer].  She doesn't smoke cigarettes.  No animal/bird exposures.  She travelled to [place] about 2 weeks ago.  No history of pneumonia or exposure to TB.  She doesn't think she snores.  She denies history of thromboembolic disease.  She has been using breo, flonase, claritin.  She is using albuterol several times per day.  Has been using tussionex.   CXR 05/18/18 (reviewed by me) >> normal lungs and heart shadow.  She was unable to do spirometry or FeNO today due to coughing during test.   Comprehensive Respiratory Exam:  Appearance - well kempt  ENMT - nasal mucosa moist, boggy turbinates clear, midline nasal septum, no dental lesions, no gingival bleeding, no oral exudates, no tonsillar hypertrophy, raspy voice, triangular uvula Neck - no masses, trachea midline, no thyromegaly, no elevation in JVP Respiratory - normal appearance of chest wall, normal respiratory effort w/o  accessory muscle use, no dullness on percussion, no wheezing or rales CV - s1s2 regular rate and rhythm, no murmurs, no peripheral edema, radial pulses symmetric GI - soft, non tender, no masses, no hepatosplenomegaly Lymph - no adenopathy noted in neck and axillary areas MSK - normal muscle strength and tone, normal gait Ext - no cyanosis, clubbing, or joint inflammation noted Skin - no rashes, lesions, or ulcers Neuro - oriented to person, place, and time Psych - normal mood and affect   CBC Latest Ref Rng & Units 05/18/2018 02/14/2018 10/31/2017  WBC 4.0 - 10.5 K/uL 13.6(H) - 8.7  Hemoglobin 12.0 - 15.0 g/dL 12.1 13.6 11.1(L)  Hematocrit 36.0 - 46.0 % 40.3 40.0 35.6(L)  Platelets 150 - 400 K/uL 292 - 240    CMP Latest Ref Rng & Units 05/18/2018 02/14/2018 10/31/2017  Glucose 70 - 99 mg/dL 113(H) 81 109(H)  BUN 6 - 20 mg/dL 10 18 10   Creatinine 0.44 - 1.00 mg/dL 1.02(H) 0.90 0.86  Sodium 135 - 145 mmol/L 141 141 137  Potassium 3.5 - 5.1 mmol/L 3.8 3.7 3.5  Chloride 98 - 111 mmol/L 105 102 106  CO2 22 - 32 mmol/L 29 - 22  Calcium 8.9 - 10.3 mg/dL 8.9 - 8.6(L)  Total Protein 6.5 - 8.1 g/dL 6.6 - -  Total Bilirubin 0.3 - 1.2 mg/dL 0.5 - -  Alkaline Phos 38 -  126 U/L 88 - -  AST 15 - 41 U/L 23 - -  ALT 0 - 44 U/L 17 - -    Discussion: She has persistent cough.  Recent chest xray was normal.  She reports sinus congestion and globus sensation.  She has history of allergies and asthma.  She could also have silent reflux as a contributor.    Assessment/Plan:  Upper airway cough syndrome. - add montelukast and azelastine  - continue fluticasone nasal spray and claritin - continue tussionex - nasal irrigation - prn mucinex - voice rest as able - salt water gargles - sip water with urge to cough - sugarless candy to keep mouth moist  Asthma. - don't think she needs additional ABx or steroids at this time - continue breo and prn albuterol - add montelukast  Possible silent acid  reflux as contributor to cough. - will have her try using omeprazole 40 mg daily for now   Patient Instructions  Saline nasal spray nightly Flonase 1 spray in each nostril nightly Azelastine 1 spray in each nostril in the morning and 1 spray in each nostril at night Montelukast 10 mg pill nightly Omeprazole 40 mg pill daily, and take 30 minutes before your first meal of the day Sip water when you have the urge to cough Uses sugarless candy to keep your mouth moist Do salt water gargles daily Avoid forcing a cough, or clearing your throat Continue breo one puff daily, and rinse mouth after each use Albuterol two puffs every 6 hours as needed for chest congestion, cough, or wheezing  Follow up in 2 weeks with Dr. Halford Chessman or Nurse Practitioner    [name], MD [place] 06/04/2018, 12:12 PM  Flow Sheet  Pulmonary tests:  Review of Systems: Constitutional: Negative for fever and unexpected weight change.  HENT: Positive for congestion, postnasal drip, sinus pressure, sneezing, sore throat, trouble swallowing and voice change. Negative for dental problem, ear pain, nosebleeds and rhinorrhea.   Eyes: Negative for redness and itching.  Respiratory: Positive for cough, chest tightness, shortness of breath and wheezing.   Cardiovascular: Negative for palpitations and leg swelling.  Gastrointestinal: Negative for nausea and vomiting.  Genitourinary: Negative for dysuria.  Musculoskeletal: Negative for joint swelling.  Skin: Negative for rash.  Allergic/Immunologic: Negative.  Negative for environmental allergies, food allergies and immunocompromised state.  Neurological: Negative for headaches.  Hematological: Does not bruise/bleed easily.  Psychiatric/Behavioral: Negative for dysphoric mood. The patient is nervous/anxious.    Past Medical History: She  has a past medical history of Asthma, Fibroids, and OAB (overactive bladder).  Past Surgical History: She   has a past surgical history that includes Cesarean section (1994, 2002, 2005,) and Abdominal hysterectomy (02/05/2006).  Family History: Her family history includes Breast cancer in her maternal aunt.  Social History: She  reports that she has never smoked. She has never used smokeless tobacco. She reports that she drinks alcohol. She reports that she does not use drugs.  Medications: Allergies as of 06/04/2018   No Known Allergies     Medication List        Accurate as of 06/04/18 12:12 PM. Always use your most recent med list.          albuterol 108 (90 Base) MCG/ACT inhaler Commonly known as:  PROVENTIL HFA;VENTOLIN HFA Inhale 1 puff into the lungs daily.   albuterol (2.5 MG/3ML) 0.083% nebulizer solution Commonly known as:  PROVENTIL Take 2.5 mg by nebulization every 6 (six) hours  as needed for wheezing or shortness of breath.   ALPRAZolam 0.25 MG tablet Commonly known as:  XANAX Take 0.25 mg by mouth daily as needed for anxiety.   azelastine 0.1 % nasal spray Commonly known as:  ASTELIN Place 1 spray into both nostrils 2 (two) times daily. Use in each nostril as directed   BREO ELLIPTA 100-25 MCG/INH Aepb Generic drug:  fluticasone furoate-vilanterol Inhale 1 puff into the lungs daily.   chlorpheniramine-HYDROcodone 10-8 MG/5ML Suer Commonly known as:  TUSSIONEX Take 5 mLs by mouth every 12 (twelve) hours as needed for cough.   fluticasone 50 MCG/ACT nasal spray Commonly known as:  FLONASE Place into both nostrils daily.   loratadine 10 MG tablet Commonly known as:  CLARITIN Take 10 mg by mouth daily.   montelukast 10 MG tablet Commonly known as:  SINGULAIR Take 1 tablet (10 mg total) by mouth at bedtime.   omeprazole 40 MG capsule Commonly known as:  PRILOSEC Take 1 capsule (40 mg total) by mouth daily.

## 2018-06-10 ENCOUNTER — Encounter: Payer: Self-pay | Admitting: Physical Therapy

## 2018-06-10 ENCOUNTER — Ambulatory Visit: Payer: Medicaid Other | Attending: Orthopaedic Surgery | Admitting: Physical Therapy

## 2018-06-10 DIAGNOSIS — M7062 Trochanteric bursitis, left hip: Secondary | ICD-10-CM | POA: Diagnosis present

## 2018-06-10 DIAGNOSIS — M25552 Pain in left hip: Secondary | ICD-10-CM | POA: Diagnosis not present

## 2018-06-10 NOTE — Therapy (Signed)
[facility] [place], [place], [address] Phone: [phone]   Fax:  [phone]  Physical Therapy Evaluation  Patient Details  Name: Joyce Welch MRN: [id number] Date of Birth: Mar 18, 1971 Referring Provider: Ninfa Linden, Loletha Grayer.   Encounter Date: 06/10/2018  PT End of Session - 06/10/18 2110    Visit Number  1    Number of Visits  4    Date for PT Re-Evaluation  07/08/18    Authorization Type  MCD    PT Start Time  1630    PT Stop Time  1715    PT Time Calculation (min)  45 min    Activity Tolerance  Patient tolerated treatment well    Behavior During Therapy  WFL for tasks assessed/performed       Past Medical History:  Diagnosis Date  . Asthma   . Fibroids   . OAB (overactive bladder)     Past Surgical History:  Procedure Laterality Date  . ABDOMINAL HYSTERECTOMY  02/05/2006   partial  . [place], 2002, 2005,    x 3    There were no vitals filed for this visit.   Subjective Assessment - 06/10/18 2058    Subjective  Pt relays worsening Lt hip pain over last few months    Limitations  Lifting;Standing;Walking    How long can you stand comfortably?  5-10 min    How long can you walk comfortably?  5 min    Diagnostic tests  hip X-ray show normal jt space but slight overhanging osteophyte of the acetabulum but otherwise no acute findings.    Patient Stated Goals  feel better    Currently in Pain?  Yes    Pain Score  6     Pain Location  Hip    Pain Orientation  Left    Pain Descriptors / Indicators  Constant;Shooting;Throbbing    Pain Type  Chronic pain    Pain Radiating Towards  Lt knee    Pain Onset  More than a month ago    Pain Frequency  Constant    Aggravating Factors   sleeping on Lt side, standing, walking, stairs    Pain Relieving Factors  cream or rest    Effect of Pain on Daily Activities  limiting work abilites    Multiple Pain Sites  No         [facility] PT Assessment -  06/10/18 0001      Assessment   Medical Diagnosis  Lt hip pain    Referring Provider  Ninfa Linden, C.    Onset Date/Surgical Date  --   3 month onset   Next MD Visit  ?    Prior Therapy  none      Precautions   Precautions  None      Balance Screen   Has the patient fallen in the past 6 months  No      [place] residence      Prior Function   Level of Independence  Independent with basic ADLs    Vocation  Part time employment    Vocation Requirements  [employer] worker, and also part time as Clinical research associate so standing work      Cognition   Overall Cognitive Status  Within Functional Limits for tasks assessed      Observation/Other Assessments   Focus on Therapeutic Outcomes (FOTO)   not set up  Sensation   Light Touch  Appears Intact      Posture/Postural Control   Posture Comments  ant pelvic tilt      ROM / Strength   AROM / PROM / Strength  AROM;Strength      AROM   Overall AROM   Within functional limits for tasks performed      Strength   Overall Strength Comments  Rt LE 5/5, Lt 4/5 grossly      Flexibility   Soft Tissue Assessment /Muscle Length  --   tight H.S, glutes, IT band Lt>Rt     Special Tests   Other special tests  + OBERS Lt      Ambulation/Gait   Gait Comments  WFL pattern                Objective measurements completed on examination: See above findings.      [facility] Adult PT Treatment/Exercise - 06/10/18 0001      Modalities   Modalities  Cryotherapy;Electrical Stimulation      Cryotherapy   Number Minutes Cryotherapy  10 Minutes    Cryotherapy Location  Hip    Type of Cryotherapy  Ice pack      Electrical Stimulation   Electrical Stimulation Location  Lt hip    Electrical Stimulation Action  IFC    Electrical Stimulation Parameters  tolerance    Electrical Stimulation Goals  Pain             PT Education - 06/10/18 2109    Education Details  HEP, POC,  TENS    Methods  Demonstration;Explanation;Verbal cues;Handout    Comprehension  Verbalized understanding;Need further instruction          PT Long Term Goals - 06/10/18 2117      PT LONG TERM GOAL #1   Title  Pt will be I and compliant with HEP. 4 weeks 07/08/18    Baseline  not compliant yet with HEP    Status  New      PT LONG TERM GOAL #2   Title  Pt will improve pain to overall less than 3/10 pain with usual activity and work activity. 4 weeks 07/08/18    Status  New      PT LONG TERM GOAL #3   Title  Pt will improve Lt LE strength to overall 4+/5 MMT to improve function. 4 weeks 07/08/18    Baseline  4/5 MMT    Status  New             Plan - 06/10/18 2111    Clinical Impression Statement  Pt presents with Lt hip pain and busitis but she does have slight overhanging osteophyte of the acetabulum on recent x-ray. She has decreased hip mobility, and ROM, decreased LE strength, increased tightness in H.S, glutes, and IT band, decreased tolerance with weight bearing, and increased pain limiting her full function. She will benefit from skilled PT to address these deficits.     History and Personal Factors relevant to plan of care:  obesity    Clinical Presentation  Evolving    Clinical Presentation due to:  evolving and worsening symptoms over 3 months    Clinical Decision Making  Moderate    Rehab Potential  Good    Clinical Impairments Affecting Rehab Potential  obesity    PT Frequency  1x / week    PT Duration  4 weeks    PT Treatment/Interventions  Cryotherapy;Electrical Stimulation;Iontophoresis 4mg /ml Dexamethasone;Moist Heat;Ultrasound;Gait training;Therapeutic  exercise;Therapeutic activities;Neuromuscular re-education;Passive range of motion;Dry needling    PT Next Visit Plan  review HEP, hip stretching and strenghtening, MT and modalties for pain    PT Home Exercise Plan  hip stretching and strenghtening    Consulted and Agree with Plan of Care  Patient        Patient will benefit from skilled therapeutic intervention in order to improve the following deficits and impairments:  Decreased activity tolerance, Decreased endurance, Decreased range of motion, Decreased strength, Hypomobility, Difficulty walking, Impaired flexibility, Postural dysfunction, Obesity, Pain  Visit Diagnosis: Pain in left hip  Trochanteric bursitis of left hip     Problem List Patient Active Problem List   Diagnosis Date Noted  . Asthma exacerbation 10/31/2017  . Leukocytosis 10/31/2017  . Strain of quadriceps tendon 02/14/2014  . Symptomatic menopausal or female climacteric states 07/31/2013    Debbe Odea, PT, DPT 06/10/2018, 9:20 PM  [facility] [address] [place], [place], [address] Phone: [phone]   Fax:  [phone]  Name: Joyce Welch MRN: [id number] Date of Birth: March 08, 1971

## 2018-06-10 NOTE — Patient Instructions (Addendum)

## 2018-06-18 ENCOUNTER — Ambulatory Visit (INDEPENDENT_AMBULATORY_CARE_PROVIDER_SITE_OTHER): Payer: Medicaid Other | Admitting: Primary Care

## 2018-06-18 ENCOUNTER — Ambulatory Visit: Payer: Medicaid Other | Admitting: Primary Care

## 2018-06-18 ENCOUNTER — Encounter: Payer: Self-pay | Admitting: Primary Care

## 2018-06-18 ENCOUNTER — Other Ambulatory Visit (INDEPENDENT_AMBULATORY_CARE_PROVIDER_SITE_OTHER): Payer: Medicaid Other

## 2018-06-18 VITALS — BP 132/80 | HR 85 | Ht 66.0 in | Wt 242.4 lb

## 2018-06-18 DIAGNOSIS — R059 Cough, unspecified: Secondary | ICD-10-CM

## 2018-06-18 DIAGNOSIS — R0982 Postnasal drip: Secondary | ICD-10-CM | POA: Diagnosis not present

## 2018-06-18 DIAGNOSIS — R05 Cough: Secondary | ICD-10-CM

## 2018-06-18 DIAGNOSIS — F458 Other somatoform disorders: Secondary | ICD-10-CM | POA: Diagnosis not present

## 2018-06-18 DIAGNOSIS — R0989 Other specified symptoms and signs involving the circulatory and respiratory systems: Secondary | ICD-10-CM

## 2018-06-18 DIAGNOSIS — R198 Other specified symptoms and signs involving the digestive system and abdomen: Secondary | ICD-10-CM

## 2018-06-18 LAB — CBC WITH DIFFERENTIAL/PLATELET
Basophils Absolute: 0.1 10*3/uL (ref 0.0–0.1)
Basophils Relative: 0.8 % (ref 0.0–3.0)
Eosinophils Absolute: 0.2 10*3/uL (ref 0.0–0.7)
Eosinophils Relative: 1.7 % (ref 0.0–5.0)
HCT: 36.9 % (ref 36.0–46.0)
Hemoglobin: 11.8 g/dL — ABNORMAL LOW (ref 12.0–15.0)
Lymphocytes Relative: 24.7 % (ref 12.0–46.0)
Lymphs Abs: 2.3 10*3/uL (ref 0.7–4.0)
MCHC: 31.9 g/dL (ref 30.0–36.0)
MCV: 77.8 fl — ABNORMAL LOW (ref 78.0–100.0)
Monocytes Absolute: 0.7 10*3/uL (ref 0.1–1.0)
Monocytes Relative: 7 % (ref 3.0–12.0)
Neutro Abs: 6.2 10*3/uL (ref 1.4–7.7)
Neutrophils Relative %: 65.8 % (ref 43.0–77.0)
Platelets: 310 10*3/uL (ref 150.0–400.0)
RBC: 4.73 Mil/uL (ref 3.87–5.11)
RDW: 13.7 % (ref 11.5–15.5)
WBC: 9.5 10*3/uL (ref 4.0–10.5)

## 2018-06-18 MED ORDER — MOMETASONE FURO-FORMOTEROL FUM 100-5 MCG/ACT IN AERO: 2.0000 | INHALATION_SPRAY | Freq: Two times a day (BID) | RESPIRATORY_TRACT | 0 refills | Status: DC

## 2018-06-18 MED ORDER — TRAMADOL HCL 50 MG PO TABS: 50.0000 mg | ORAL_TABLET | Freq: Four times a day (QID) | ORAL | 0 refills | Status: AC | PRN

## 2018-06-18 MED ORDER — PREDNISONE 10 MG PO TABS: ORAL_TABLET | ORAL | 0 refills | Status: DC

## 2018-06-18 NOTE — Patient Instructions (Addendum)
Cough: Delsym cough syrup twice a day x10 days (samples given) Mucinex twice daily with full glass of water x 10 days (samples given) Tramdol 50mg  every 6 hours as needed for cough x 10 days  Continue Flonase nasal spray daily and Prilosec daily  Use albuterol rescue inhaler/ nebulizer every 4-6 hours as needed for cough/sob  RX: Prednisone taper as prescribed Change Breo to Dulera 100- take 2 puffs twice a day   FU with Dr. Halford Chessman or NP in 2-4 weeks

## 2018-06-18 NOTE — Progress Notes (Signed)
@Patient  ID: Joyce Welch, female    DOB: 23-Jan-1971, 48 y.o.   MRN: [id number]  Chief Complaint  Patient presents with  . Follow-up    cough with greenish mucus-getting stuck and gasping for air x1 month    Referring provider: [name], MD  HPI: 47 year old female, never smoked. PMH asthma exacerbation. Patient of Dr. Halford Chessman, seen for initial consult on 06/04/18. Self referred to pulmonary clinic for prod cough, wheezing and sob. Reports having a cough x4 weeks, has been treated with abx and prednisone. Associated compliants of postnasal drip and globus sensation. CXR on 05/18/18 showed clear lungs. Unable to complete FENO or spirometry d/t coughing. Maintained on Breo, flonase, claritin and prn albuterol. Plan is to add Singulair, azelastine and omeprazole to current regimen.   06/20/2018 Patient return today for 2 week fu. Continues to complains of productive cough, esp when lying down. Feels mucus gets caught in her airway and reports gaging. Symptoms have been ongoing for the last 2 1/2 months. Some improvement since last visit, states that her cough has improved a little, not as constant as it was before. She has been taking Flonase and Protonix as prescribed everyday. Using Breo daily, rescue inhaler on average twice a day which does help. Running out of prescription cough medication. Denies nasal congestion  Testing: >> Spirometry 06/20/2018- Normal. FVC 2.4 (77%), FEV1 2.2 (86%), RATIO 91. Normal flow vol loop.  >> FENO 06/20/2018 - Unable to completed today     No Known Allergies   There is no immunization history on file for this patient.  Past Medical History:  Diagnosis Date  . Asthma   . Fibroids   . OAB (overactive bladder)     Tobacco History: Social History   Tobacco Use  Smoking Status Never Smoker  Smokeless Tobacco Never Used   Counseling given: Not Answered   Outpatient Medications Prior to Visit  Medication Sig Dispense Refill  . albuterol  (PROVENTIL HFA;VENTOLIN HFA) 108 (90 BASE) MCG/ACT inhaler Inhale 1 puff into the lungs daily.     Marland Kitchen albuterol (PROVENTIL) (2.5 MG/3ML) 0.083% nebulizer solution Take 2.5 mg by nebulization every 6 (six) hours as needed for wheezing or shortness of breath.    . ALPRAZolam (XANAX) 0.25 MG tablet Take 0.25 mg by mouth daily as needed for anxiety.   0  . azelastine (ASTELIN) 0.1 % nasal spray Place 1 spray into both nostrils 2 (two) times daily. Use in each nostril as directed 30 mL 12  . chlorpheniramine-HYDROcodone (TUSSIONEX) 10-8 MG/5ML SUER Take 5 mLs by mouth every 12 (twelve) hours as needed for cough. 115 mL 0  . fluticasone (FLONASE) 50 MCG/ACT nasal spray Place into both nostrils daily.    . fluticasone furoate-vilanterol (BREO ELLIPTA) 100-25 MCG/INH AEPB Inhale 1 puff into the lungs daily.    Marland Kitchen loratadine (CLARITIN) 10 MG tablet Take 10 mg by mouth daily.    . montelukast (SINGULAIR) 10 MG tablet Take 1 tablet (10 mg total) by mouth at bedtime. 30 tablet 5  . omeprazole (PRILOSEC) 40 MG capsule Take 1 capsule (40 mg total) by mouth daily. 30 capsule 1   No facility-administered medications prior to visit.     Review of Systems  Review of Systems  Constitutional: Negative.   HENT: Negative.   Respiratory: Positive for cough. Negative for shortness of breath and wheezing.   Cardiovascular: Negative.     Physical Exam  BP 132/80 (BP Location: Left Arm, Cuff Size: Normal)  Pulse 85   Ht 5\' 6"  (1.676 m)   Wt 242 lb 6.4 oz (110 kg)   SpO2 99%   BMI 39.12 kg/m  Physical Exam  Constitutional: She is oriented to person, place, and time. She appears well-developed and well-nourished. No distress.  HENT:  Head: Normocephalic and atraumatic.  Eyes: Pupils are equal, round, and reactive to light. EOM are normal.  Neck: Normal range of motion. Neck supple.  Cardiovascular: Normal rate and regular rhythm.  Pulmonary/Chest: Effort normal.  LS CTA. Reactive cough   Neurological: She  is alert and oriented to person, place, and time.  Skin: Skin is warm and dry.  Psychiatric: Her behavior is normal. Thought content normal.     Lab Results:  CBC    Component Value Date/Time   WBC 9.5 06/18/2018 1531   RBC 4.73 06/18/2018 1531   HGB 11.8 (L) 06/18/2018 1531   HCT 36.9 06/18/2018 1531   PLT 310.0 06/18/2018 1531   MCV 77.8 (L) 06/18/2018 1531   MCH 25.3 (L) 05/18/2018 1223   MCHC 31.9 06/18/2018 1531   RDW 13.7 06/18/2018 1531   LYMPHSABS 2.3 06/18/2018 1531   MONOABS 0.7 06/18/2018 1531   EOSABS 0.2 06/18/2018 1531   BASOSABS 0.1 06/18/2018 1531    BMET    Component Value Date/Time   NA 141 05/18/2018 1223   K 3.8 05/18/2018 1223   CL 105 05/18/2018 1223   CO2 29 05/18/2018 1223   GLUCOSE 113 (H) 05/18/2018 1223   BUN 10 05/18/2018 1223   CREATININE 1.02 (H) 05/18/2018 1223   CALCIUM 8.9 05/18/2018 1223   GFRNONAA >60 05/18/2018 1223   GFRAA >60 05/18/2018 1223    BNP No results found for: BNP  ProBNP No results found for: PROBNP  Imaging: No results found.   Assessment & Plan:   Cough Complains of productive cough x 4 weeks with PND and globus sensation   Testing: - She was previously unable to complete spiro and FENO d/t coughing.  - Spirometry today normal.  - FVC 2.4 (77%), FEV1 2.2 (86%), RATIO 91. Normal flow vol loop.  - Unable to complete FENO - CXR 05/18/18- No acute cardiopulmonary disease   Treatment: - Prednisone taper  - Tramdol 50mg  every 6 hours as needed for cough x 10 days  - Delsym cough syrup twice a day x10 days (samples given) - Mucinex twice daily with full glass of water x 10 days (samples given) - Use albuterol rescue inhaler/ nebulizer every 4-6 hours as needed for cough/sob - Change Breo to Dulera 100 - take 2 puffs twice a day - FU in 2-4 weeks   Post-nasal drip - Continue Flonase nasal spray daily    Globus sensation - Continue Prilosec daily   > 20 mins spent face to face with patient    Martyn Ehrich, NP 06/20/2018

## 2018-06-19 ENCOUNTER — Ambulatory Visit (INDEPENDENT_AMBULATORY_CARE_PROVIDER_SITE_OTHER): Payer: Medicaid Other | Admitting: Orthopaedic Surgery

## 2018-06-19 LAB — RESPIRATORY ALLERGY PROFILE REGION II ~~LOC~~
Allergen, A. alternata, m6: <0.1 kU/L
Allergen, Cedar tree, t12: <0.1 kU/L
Allergen, Comm Silver Birch, t9: <0.1 kU/L
Allergen, Cottonwood, t14: <0.1 kU/L
Allergen, D pternoyssinus,d7: <0.1 kU/L
Allergen, Mouse Urine Protein, e78: <0.1 kU/L
Allergen, Mulberry, t76: <0.1 kU/L
Allergen, Oak,t7: <0.1 kU/L
Allergen, P. notatum, m1: <0.1 kU/L
Aspergillus fumigatus, m3: <0.1 kU/L
Bermuda Grass: <0.1 kU/L
Box Elder IgE: <0.1 kU/L
CLADOSPORIUM HERBARUM (M2) IGE: <0.1 kU/L
COMMON RAGWEED (SHORT) (W1) IGE: <0.1 kU/L
Cat Dander: <0.1 kU/L
Class: 0
Class: 0
Class: 0
Class: 0
Class: 0
Class: 0
Class: 0
Class: 0
Class: 0
Class: 0
Class: 0
Class: 0
Class: 0
Class: 0
Class: 0
Class: 0
Class: 0
Class: 0
Class: 0
Class: 0
Class: 0
Class: 0
Class: 0
Class: 0
Cockroach: 0.25 kU/L — ABNORMAL HIGH
D. farinae: <0.1 kU/L
Dog Dander: <0.1 kU/L
Elm IgE: <0.1 kU/L
IgE (Immunoglobulin E), Serum: 57 kU/L (ref <=114)
Johnson Grass: <0.1 kU/L
Pecan/Hickory Tree IgE: <0.1 kU/L
Rough Pigweed  IgE: <0.1 kU/L
Sheep Sorrel IgE: <0.1 kU/L
Timothy Grass: <0.1 kU/L

## 2018-06-19 LAB — INTERPRETATION:

## 2018-06-20 ENCOUNTER — Encounter: Payer: Self-pay | Admitting: Primary Care

## 2018-06-20 DIAGNOSIS — R0982 Postnasal drip: Secondary | ICD-10-CM | POA: Insufficient documentation

## 2018-06-20 DIAGNOSIS — R05 Cough: Secondary | ICD-10-CM | POA: Insufficient documentation

## 2018-06-20 DIAGNOSIS — R059 Cough, unspecified: Secondary | ICD-10-CM | POA: Insufficient documentation

## 2018-06-20 DIAGNOSIS — R0989 Other specified symptoms and signs involving the circulatory and respiratory systems: Secondary | ICD-10-CM | POA: Insufficient documentation

## 2018-06-20 DIAGNOSIS — R198 Other specified symptoms and signs involving the digestive system and abdomen: Secondary | ICD-10-CM | POA: Insufficient documentation

## 2018-06-20 NOTE — Assessment & Plan Note (Signed)
-   Continue Prilosec daily  

## 2018-06-20 NOTE — Assessment & Plan Note (Addendum)
Complains of productive cough x 4 weeks with PND and globus sensation   Testing: - She was previously unable to complete spiro and FENO d/t coughing.  - Spirometry today normal.  - FVC 2.4 (77%), FEV1 2.2 (86%), RATIO 91. Normal flow vol loop.  - Unable to complete FENO - CXR 05/18/18- No acute cardiopulmonary disease   Treatment: - Prednisone taper  - Tramdol 50mg  every 6 hours as needed for cough x 10 days  - Delsym cough syrup twice a day x10 days (samples given) - Mucinex twice daily with full glass of water x 10 days (samples given) - Use albuterol rescue inhaler/ nebulizer every 4-6 hours as needed for cough/sob - Change Breo to Dulera 100 - take 2 puffs twice a day - FU in 2-4 weeks

## 2018-06-20 NOTE — Assessment & Plan Note (Signed)
-   Continue Flonase nasal spray daily

## 2018-06-21 NOTE — Progress Notes (Signed)
Reviewed and agree with assessment/plan.   Milayah Krell, MD [facility] 09/27/2016, 12:24 PM Pager:  [phone]  

## 2018-06-24 ENCOUNTER — Ambulatory Visit: Payer: Medicaid Other | Admitting: Physical Therapy

## 2018-06-24 DIAGNOSIS — M25552 Pain in left hip: Secondary | ICD-10-CM

## 2018-06-24 DIAGNOSIS — M7062 Trochanteric bursitis, left hip: Secondary | ICD-10-CM

## 2018-06-24 NOTE — Therapy (Signed)
[place], [place], [address] Phone: [phone]   Fax:  [phone]  Physical Therapy Treatment  Patient Details  Name: Joyce Welch MRN: [id number] Date of Birth: [birth year] Referring Provider: Ninfa Linden, Loletha Grayer.   Encounter Date: 06/24/2018  PT End of Session - 06/24/18 1615    Visit Number  2    Number of Visits  4    Date for PT Re-Evaluation  07/08/18    Authorization Type  MCD    Authorization Time Period  3 approaved visits 9/20 to 07/11/18    Authorization - Visit Number  1    Authorization - Number of Visits  3    PT Start Time  7680    PT Stop Time  1630    PT Time Calculation (min)  45 min    Activity Tolerance  Patient tolerated treatment well    Behavior During Therapy  [facility] for tasks assessed/performed       Past Medical History:  Diagnosis Date  . Asthma   . Fibroids   . OAB (overactive bladder)     Past Surgical History:  Procedure Laterality Date  . ABDOMINAL HYSTERECTOMY  02/05/2006   partial  . [place], 2002, 2005,    x 3    There were no vitals filed for this visit.  Subjective Assessment - 06/24/18 1551    Subjective  Pt relays HEP is going well    Currently in Pain?  Yes    Pain Score  4     Pain Location  Hip    Pain Orientation  Left    Pain Descriptors / Indicators  Aching;Dull    Pain Type  Chronic pain                       [facility] Adult PT Treatment/Exercise - 06/24/18 0001      Exercises   Exercises  Knee/Hip      Knee/Hip Exercises: Stretches   Passive Hamstring Stretch  Left;2 reps;30 seconds    ITB Stretch  Left;3 reps;30 seconds    Piriformis Stretch  Left;3 reps;30 seconds      Knee/Hip Exercises: Supine   Heel Slides  AROM;Left;10 reps    Hip Adduction Isometric  10 reps   10 sec   Bridges  10 reps    Other Supine Knee/Hip Exercises  clam red X 20      Modalities   Modalities  Cryotherapy;Electrical Stimulation      Cryotherapy   Number Minutes Cryotherapy  10 Minutes    Cryotherapy Location  Hip    Type of Cryotherapy  Ice pack      Electrical Stimulation   Electrical Stimulation Location  Lt hip    Electrical Stimulation Action  IFC    Electrical Stimulation Parameters  tolerance    Electrical Stimulation Goals  Pain      Manual Therapy   Manual therapy comments  STM/IASTM with roller stick to Lt hip,IT band and glutes                  PT Long Term Goals - 06/10/18 2117      PT LONG TERM GOAL #1   Title  Pt will be I and compliant with HEP. 4 weeks 07/08/18    Baseline  not compliant yet with HEP    Status  New      PT LONG TERM GOAL #2  Title  Pt will improve pain to overall less than 3/10 pain with usual activity and work activity. 4 weeks 07/08/18    Status  New      PT LONG TERM GOAL #3   Title  Pt will improve Lt LE strength to overall 4+/5 MMT to improve function. 4 weeks 07/08/18    Baseline  4/5 MMT    Status  New            Plan - 06/24/18 1617    Clinical Impression Statement  Pt had overall less pain today and was able to progress hip stretching and strengthening with good tolerance. She was not able to tolerate much pressure with MT today however. Session ended with CP and TENS to reduce pain and inflammation.    Rehab Potential  Good    Clinical Impairments Affecting Rehab Potential  obesity    PT Frequency  1x / week    PT Duration  4 weeks    PT Treatment/Interventions  Cryotherapy;Electrical Stimulation;Iontophoresis 4mg /ml Dexamethasone;Moist Heat;Ultrasound;Gait training;Therapeutic exercise;Therapeutic activities;Neuromuscular re-education;Passive range of motion;Dry needling    PT Next Visit Plan  hip stretching and strenghtening, MT and modalties for pain    PT Home Exercise Plan  hip stretching and strenghtening    Consulted and Agree with Plan of Care  Patient       Patient will benefit from skilled therapeutic intervention in order to  improve the following deficits and impairments:  Decreased activity tolerance, Decreased endurance, Decreased range of motion, Decreased strength, Hypomobility, Difficulty walking, Impaired flexibility, Postural dysfunction, Obesity, Pain  Visit Diagnosis: Pain in left hip  Trochanteric bursitis of left hip     Problem List Patient Active Problem List   Diagnosis Date Noted  . Cough 06/20/2018  . Post-nasal drip 06/20/2018  . Globus sensation 06/20/2018  . Asthma exacerbation 10/31/2017  . Leukocytosis 10/31/2017  . Strain of quadriceps tendon 02/14/2014  . Symptomatic menopausal or female climacteric states 07/31/2013    Debbe Odea, PT, DPT 06/24/2018, 4:23 PM  [facility] [address] [place], [place], [address] Phone: [phone]   Fax:  [phone]  Name: Joyce Welch MRN: [id number] Date of Birth: 1970-12-03

## 2018-07-01 ENCOUNTER — Ambulatory Visit: Payer: Medicaid Other | Admitting: Physical Therapy

## 2018-07-01 ENCOUNTER — Telehealth: Payer: Self-pay | Admitting: Physical Therapy

## 2018-07-01 NOTE — Telephone Encounter (Signed)
Pt no show for PT appointment today. They where contacted and informed of this, she states she forgot. They where given number to call front office to reschedule and where informed of their next appointment.   Elsie Ra, PT, DPT 07/01/18 4:25 PM

## 2018-07-08 ENCOUNTER — Other Ambulatory Visit: Payer: Self-pay

## 2018-07-08 ENCOUNTER — Ambulatory Visit: Payer: Medicaid Other | Attending: Orthopaedic Surgery | Admitting: Physical Therapy

## 2018-07-08 ENCOUNTER — Encounter: Payer: Self-pay | Admitting: Physical Therapy

## 2018-07-08 DIAGNOSIS — M7062 Trochanteric bursitis, left hip: Secondary | ICD-10-CM | POA: Insufficient documentation

## 2018-07-08 DIAGNOSIS — M25552 Pain in left hip: Secondary | ICD-10-CM | POA: Diagnosis present

## 2018-07-08 NOTE — Therapy (Signed)
[place], [place], [address] Phone: [phone]   Fax:  [phone]  Physical Therapy Treatment  Patient Details  Name: Joyce Welch MRN: [id number] Date of Birth: Mar 05, 1971 Referring Provider (PT): Ninfa Linden, C.   Encounter Date: 07/08/2018  PT End of Session - 07/08/18 1600    Visit Number  3    Number of Visits  4    Date for PT Re-Evaluation  07/08/18    Authorization Type  Medicaid 3 approved visits 06/21/18-07/11/18, requesting 8 additional visits    Authorization Time Period  3 approved visits 9/20 to 07/11/18    Authorization - Visit Number  2    Authorization - Number of Visits  3    PT Start Time  1509    PT Stop Time  1607    PT Time Calculation (min)  58 min    Activity Tolerance  --   limited tolerance heel slide for hip ROM exercise due to anterior hip pain, fair tx. tolerance overall due to hip soreness   Behavior During Therapy  [facility] for tasks assessed/performed       Past Medical History:  Diagnosis Date  . Asthma   . Fibroids   . OAB (overactive bladder)     Past Surgical History:  Procedure Laterality Date  . ABDOMINAL HYSTERECTOMY  02/05/2006   partial  . [place], 2002, 2005,    x 3    There were no vitals filed for this visit.  Subjective Assessment - 07/08/18 1528    Subjective  Pt. reports missed visit last week due to schedule difficulties with work and her daughters. Still having left lateral hip pain but notes improvement of approximately 60% from baseline status at eval. Symptoms more intermittent at this point. Primary difficulties are with standing/walking.    Limitations  Lifting;Standing;Walking    How long can you stand comfortably?  varies    How long can you walk comfortably?  varies    Diagnostic tests  hip X-ray show normal jt space but slight overhanging osteophyte of the acetabulum but otherwise no acute findings.    Patient Stated Goals   feel better    Currently in Pain?  Yes    Pain Score  4     Pain Location  Hip    Pain Orientation  Lateral;Left    Pain Descriptors / Indicators  Aching;Dull    Pain Type  Chronic pain    Pain Onset  More than a month ago    Pain Frequency  Intermittent    Aggravating Factors   standing, walking, sleeping on left side    Pain Relieving Factors  rest, topical cream    Effect of Pain on Daily Activities  limiting tolerance for work duties and standing/walking for IADLs, chores, and community ambulation     Multiple Pain Sites  No         [facility] PT Assessment - 07/08/18 0001      Assessment   Medical Diagnosis  Left hpi pain    Referring Provider (PT)  Ninfa Linden, C.      Precautions   Precautions  None      AROM   Overall AROM   Within functional limits for tasks performed      Strength   Overall Strength Comments  Rt LE 5/5, Lt 4/5 grossly      Palpation   Palpation comment  Tender to palpation left trochanteric region and  gluteus medius                   [facility] Adult PT Treatment/Exercise - 07/08/18 0001      Exercises   Exercises  Knee/Hip      Knee/Hip Exercises: Stretches   Passive Hamstring Stretch  Left;3 reps;30 seconds    ITB Stretch  Left;3 reps;30 seconds    Piriformis Stretch  Left;3 reps;30 seconds      Knee/Hip Exercises: Standing   Hip Abduction  Both;2 sets;10 reps;AROM    Hip Extension  Both;AROM;2 sets;10 reps    Functional Squat  1 set;15 reps   partial squat     Knee/Hip Exercises: Seated   Abduction/Adduction   Strengthening;2 sets;10 reps   seated hip abd with red Theraband      Knee/Hip Exercises: Supine   Heel Slides  AAROM;Left   attempted but stopped after 4 reps due to hip pain   Hip Adduction Isometric  15 reps   5 sec holds   Bridges  15 reps    Other Supine Knee/Hip Exercises  clam red 2x10      Cryotherapy   Number Minutes Cryotherapy  15 Minutes    Cryotherapy Location  Hip   left   Type of Cryotherapy  Ice  pack      Electrical Stimulation   Electrical Stimulation Location  Lt hip    Electrical Stimulation Action  IFC    Electrical Stimulation Parameters  to tolerance x 15 min with cold ack    Electrical Stimulation Goals  Pain      Manual Therapy   Manual Therapy  Soft tissue mobilization    Manual therapy comments  STM/IASTM to left gluteus medius and IT band region   also performed left hip distraction grade I-III x 4 min            PT Education - 07/08/18 1557    Education Details  HEP updates, POC, etiology current hip pain with relation to muscle weakness for gait mechanics    Person(s) Educated  Patient    Methods  Explanation;Demonstration;Verbal cues    Comprehension  Verbalized understanding          PT Long Term Goals - 07/08/18 1608      PT LONG TERM GOAL #1   Title  Pt will be I and compliant with HEP. 4 weeks from eval 07/08/18    Baseline  not compliant with HEP at eval, reports performing, goal met    Time  4    Period  Weeks    Status  Achieved      PT LONG TERM GOAL #2   Title  Pt will improve pain to overall less than 3/10 pain with usual activity and work activity. 4 weeks 08/05/18    Baseline  pain 4/10 this PM, variable intermittent symptoms with pain level higher at times    Time  4    Period  Weeks    Status  On-going      PT LONG TERM GOAL #3   Title  Pt will improve Lt LE strength to overall 4+/5 MMT to improve function. 4 weeks 08/05/18    Baseline  4/5 MMT    Time  4    Period  Weeks    Status  On-going      PT LONG TERM GOAL #4   Title  Tolerate standing/ambulation periods at least 30-40 minutes for work duties and community mobility for activities such  as grocery shopping with pain 3/10 or less    Baseline  during exacerbations limited to 5-10 minutes, pain 4/10 this PM    Time  4    Period  Weeks    Status  New            Plan - 07/08/18 1604    History and Personal Factors relevant to plan of care:  obesity    Clinical  Presentation  Stable    Clinical Presentation due to:  duration symptoms, level of hip weaknesss, decreased activity tolerance with fait tolerance exercises    Clinical Decision Making  Low    Rehab Potential  Good    Clinical Impairments Affecting Rehab Potential  obesity    PT Frequency  2x / week    PT Duration  4 weeks    PT Treatment/Interventions  Cryotherapy;Electrical Stimulation;Iontophoresis 86m/ml Dexamethasone;Moist Heat;Ultrasound;Gait training;Therapeutic exercise;Therapeutic activities;Neuromuscular re-education;Passive range of motion;Dry needling;Taping    PT Next Visit Plan  hip stretching and strenghtening, MT and modalties for pain    PT Home Exercise Plan  hip stretching and strenghtening-updated today with lateral hip stretch    Consulted and Agree with Plan of Care  Patient       Patient will benefit from skilled therapeutic intervention in order to improve the following deficits and impairments:  Decreased activity tolerance, Decreased endurance, Decreased range of motion, Decreased strength, Hypomobility, Difficulty walking, Impaired flexibility, Postural dysfunction, Obesity, Pain, Abnormal gait  Visit Diagnosis: Pain in left hip  Trochanteric bursitis of left hip     Problem List Patient Active Problem List   Diagnosis Date Noted  . Cough 06/20/2018  . Post-nasal drip 06/20/2018  . Globus sensation 06/20/2018  . Asthma exacerbation 10/31/2017  . Leukocytosis 10/31/2017  . Strain of quadriceps tendon 02/14/2014  . Symptomatic menopausal or female climacteric states 07/31/2013    CBeaulah Dinning PT, DPT 07/08/18 4:15 PM  [place] [address]: [phone]  Fax:  [phone] Name: Joyce ADEEMRN: [id number] of Birth: [birth year]

## 2018-07-12 ENCOUNTER — Ambulatory Visit (INDEPENDENT_AMBULATORY_CARE_PROVIDER_SITE_OTHER): Payer: Medicaid Other | Admitting: Nurse Practitioner

## 2018-07-12 ENCOUNTER — Encounter: Payer: Self-pay | Admitting: Nurse Practitioner

## 2018-07-12 VITALS — BP 132/70 | HR 92 | Ht 66.0 in | Wt 241.8 lb

## 2018-07-12 DIAGNOSIS — R05 Cough: Secondary | ICD-10-CM | POA: Diagnosis not present

## 2018-07-12 DIAGNOSIS — J3489 Other specified disorders of nose and nasal sinuses: Secondary | ICD-10-CM | POA: Diagnosis not present

## 2018-07-12 DIAGNOSIS — R059 Cough, unspecified: Secondary | ICD-10-CM

## 2018-07-12 MED ORDER — MOMETASONE FURO-FORMOTEROL FUM 100-5 MCG/ACT IN AERO: 2.0000 | INHALATION_SPRAY | Freq: Two times a day (BID) | RESPIRATORY_TRACT | 0 refills | Status: DC

## 2018-07-12 NOTE — Progress Notes (Signed)
@Patient  ID: Joyce Welch, female    DOB: [birth year], 47 y.o.   MRN: [id number]  Chief Complaint  Patient presents with  . Follow-up    Referring provider: [name], MD   HPI 47 year old never smoker with asthma followed by Dr. Halford Chessman.  Productive cough with thick sputum, denies any fevers   Tests: Chest x ray 05/18/18 - no active cardiopulmonary disease  OV 07/12/18 - follow up chronic cough Patient presents today for follow up on chronic cough. States that cough has been ongoing for 12 weeks now. She states that it is progressively worsening. She has been seen here twice on 06/04/18 and 06/18/18. She has been compliant with recommendations and medications, but nothing has given her any relief. She states that she feels like there is something in her throat making her feel the need to cough. She denies nay fever, shortness of breath, or chest pain. She denies sinus congestion or postnasal drip.   No Known Allergies   There is no immunization history on file for this patient.  Past Medical History:  Diagnosis Date  . Asthma   . Fibroids   . OAB (overactive bladder)     Tobacco History: Social History   Tobacco Use  Smoking Status Never Smoker  Smokeless Tobacco Never Used   Counseling given: Yes   Outpatient Encounter Medications as of 07/12/2018  Medication Sig  . albuterol (PROVENTIL HFA;VENTOLIN HFA) 108 (90 BASE) MCG/ACT inhaler Inhale 1 puff into the lungs daily.   Marland Kitchen albuterol (PROVENTIL) (2.5 MG/3ML) 0.083% nebulizer solution Take 2.5 mg by nebulization every 6 (six) hours as needed for wheezing or shortness of breath.  . ALPRAZolam (XANAX) 0.25 MG tablet Take 0.25 mg by mouth daily as needed for anxiety.   Marland Kitchen azelastine (ASTELIN) 0.1 % nasal spray Place 1 spray into both nostrils 2 (two) times daily. Use in each nostril as directed  . chlorpheniramine-HYDROcodone (TUSSIONEX) 10-8 MG/5ML SUER Take 5 mLs by mouth every 12 (twelve) hours as needed for cough.  .  fluticasone (FLONASE) 50 MCG/ACT nasal spray Place into both nostrils daily.  . furosemide (LASIX) 20 MG tablet Take 1 tablet by mouth as needed.  . loratadine (CLARITIN) 10 MG tablet Take 10 mg by mouth daily.  . mometasone-formoterol (DULERA) 100-5 MCG/ACT AERO Inhale 2 puffs into the lungs 2 (two) times daily.  . montelukast (SINGULAIR) 10 MG tablet Take 1 tablet (10 mg total) by mouth at bedtime.  Marland Kitchen omeprazole (PRILOSEC) 40 MG capsule Take 1 capsule (40 mg total) by mouth daily.  . predniSONE (DELTASONE) 10 MG tablet Take 4 tabs po daily x 3 days; then 3 tabs daily x3 days; then 2 tabs daily x3 days; then 1 tab daily x 3 days; then stop  . [DISCONTINUED] mometasone-formoterol (DULERA) 100-5 MCG/ACT AERO Inhale 2 puffs into the lungs 2 (two) times daily.  . [DISCONTINUED] fluticasone furoate-vilanterol (BREO ELLIPTA) 100-25 MCG/INH AEPB Inhale 1 puff into the lungs daily.   No facility-administered encounter medications on file as of 07/12/2018.      Review of Systems  Review of Systems  Constitutional: Negative.  Negative for chills and fever.  HENT: Negative.  Negative for congestion, sinus pressure and sinus pain.   Respiratory: Positive for cough and shortness of breath.   Cardiovascular: Negative.  Negative for chest pain, palpitations and leg swelling.  Gastrointestinal: Negative.   Allergic/Immunologic: Negative.   Neurological: Negative.   Psychiatric/Behavioral: Negative.        Physical  Exam  BP 132/70 (BP Location: Right Arm, Patient Position: Sitting, Cuff Size: Large)   Pulse 92   Ht 5\' 6"  (1.676 m)   Wt 241 lb 12.8 oz (109.7 kg)   SpO2 99%   BMI 39.03 kg/m   Wt Readings from Last 5 Encounters:  07/12/18 241 lb 12.8 oz (109.7 kg)  06/18/18 242 lb 6.4 oz (110 kg)  06/04/18 242 lb 4.8 oz (109.9 kg)  10/31/17 233 lb 4 oz (105.8 kg)  12/21/16 244 lb (110.7 kg)     Physical Exam  Constitutional: She is oriented to person, place, and time. She appears  well-developed and well-nourished. No distress.  HENT:  Nose: Right sinus exhibits no maxillary sinus tenderness and no frontal sinus tenderness. Left sinus exhibits no maxillary sinus tenderness and no frontal sinus tenderness.  Mouth/Throat: No oropharyngeal exudate or posterior oropharyngeal edema.  Neck: Trachea normal, normal range of motion and full passive range of motion without pain. Neck supple. No edema present. No thyroid mass present.  Cardiovascular: Normal rate and regular rhythm.  Pulmonary/Chest: Effort normal and breath sounds normal.  Neurological: She is alert and oriented to person, place, and time.  Psychiatric: She has a normal mood and affect.  Nursing note and vitals reviewed.     Assessment & Plan:   Cough Patient Instructions  Will order CT of sinus and chest - will call with results Continue current medications Sugar free candy for comfort Warm salt water gargles Follow up in 2-3 weeks with Dr. Talmadge Chad, NP 07/12/2018

## 2018-07-12 NOTE — Patient Instructions (Addendum)
Will order CT of sinus and chest - will call with results Continue current medications Sugar free candy for comfort Warm salt water gargles Follow up in 2-3 weeks with Dr. Halford Chessman

## 2018-07-12 NOTE — Assessment & Plan Note (Addendum)
Patient Instructions  Will order CT of sinus and chest - will call with results Continue current medications Sugar free candy for comfort Warm salt water gargles Follow up in 2-3 weeks with Dr. Halford Chessman

## 2018-07-15 ENCOUNTER — Ambulatory Visit: Payer: Medicaid Other | Admitting: Physical Therapy

## 2018-07-15 NOTE — Progress Notes (Signed)
Reviewed and agree with assessment/plan.   Ruhani Umland, MD [facility] 09/27/2016, 12:24 PM Pager:  [phone]  

## 2018-07-29 ENCOUNTER — Ambulatory Visit (INDEPENDENT_AMBULATORY_CARE_PROVIDER_SITE_OTHER)
Admission: RE | Admit: 2018-07-29 | Discharge: 2018-07-29 | Disposition: A | Discharge status: Home / Self Care | Payer: Medicaid Other | Source: Ambulatory Visit | Attending: Nurse Practitioner | Admitting: Nurse Practitioner

## 2018-07-29 DIAGNOSIS — R05 Cough: Secondary | ICD-10-CM

## 2018-07-29 DIAGNOSIS — R059 Cough, unspecified: Secondary | ICD-10-CM

## 2018-07-29 MED ORDER — IOPAMIDOL (ISOVUE-300) INJECTION 61%
100.0000 mL | Freq: Once | INTRAVENOUS | Status: AC | PRN
  Administered 2018-07-29: 80 mL via INTRAVENOUS

## 2018-07-30 ENCOUNTER — Telehealth: Payer: Self-pay | Admitting: Nurse Practitioner

## 2018-07-30 NOTE — Telephone Encounter (Signed)
See related CT result note. Vmail was left for patient to call. Referral for ENT was placed.

## 2018-07-30 NOTE — Addendum Note (Signed)
Addended by: Nena Polio on: 07/30/2018 02:16 PM   Modules accepted: Orders

## 2018-07-30 NOTE — Telephone Encounter (Signed)
Called and spoke with pt who stated she was wanting to know the results of CT scan.  Pt stated she received a call from [facility] earlier but I do not see any documentation where pt was called. Hinton Dyer, please advise on this for [place]. Thanks!

## 2018-07-31 NOTE — Telephone Encounter (Signed)
Joyce Welch please advise of the CT results. I am unable to see the result notes or results of pt's CT. Thank you. I have checked your box, encounters, and patient's chart under encounter and notes tab and no luck. Please help when you can, thank you.

## 2018-07-31 NOTE — Telephone Encounter (Signed)
Please call to let patient know that her chest CT was normal. Her sinus CT did show moderate sinus mucosal thickening. Please place referral to ENT.

## 2018-08-01 NOTE — Telephone Encounter (Signed)
ATC pt, no answer. Left message for pt to call back.  

## 2018-08-02 NOTE — Telephone Encounter (Signed)
Attempted to call pt but unable to reach her and unable to leave a VM due to box being full. Will try to call back later.

## 2018-08-05 NOTE — Telephone Encounter (Signed)
Patient is returning phone call.  Phone number is [phone].

## 2018-08-05 NOTE — Telephone Encounter (Signed)
Pt aware of results/recs.  Referral has already been placed.  Nothing further needed.

## 2018-08-05 NOTE — Telephone Encounter (Signed)
ATC but voice mail was full will call back.

## 2018-08-06 ENCOUNTER — Emergency Department (HOSPITAL_COMMUNITY): Payer: Medicaid Other

## 2018-08-06 ENCOUNTER — Encounter (HOSPITAL_COMMUNITY): Payer: Self-pay

## 2018-08-06 ENCOUNTER — Encounter (HOSPITAL_COMMUNITY): Payer: Self-pay | Admitting: Emergency Medicine

## 2018-08-06 ENCOUNTER — Emergency Department (HOSPITAL_COMMUNITY)
Admission: EM | Admit: 2018-08-06 | Discharge: 2018-08-06 | Disposition: A | Discharge status: Home / Self Care | Payer: Medicaid Other | Attending: Emergency Medicine | Admitting: Emergency Medicine

## 2018-08-06 ENCOUNTER — Ambulatory Visit (HOSPITAL_COMMUNITY)
Admission: EM | Admit: 2018-08-06 | Discharge: 2018-08-06 | Disposition: A | Discharge status: Other Acute Inpatient Hospital | Payer: Medicaid Other | Attending: Family Medicine | Admitting: Family Medicine

## 2018-08-06 DIAGNOSIS — R202 Paresthesia of skin: Secondary | ICD-10-CM | POA: Diagnosis not present

## 2018-08-06 DIAGNOSIS — R531 Weakness: Secondary | ICD-10-CM

## 2018-08-06 DIAGNOSIS — E876 Hypokalemia: Secondary | ICD-10-CM | POA: Insufficient documentation

## 2018-08-06 DIAGNOSIS — Z79899 Other long term (current) drug therapy: Secondary | ICD-10-CM | POA: Diagnosis not present

## 2018-08-06 DIAGNOSIS — R51 Headache: Secondary | ICD-10-CM | POA: Diagnosis not present

## 2018-08-06 DIAGNOSIS — R4781 Slurred speech: Secondary | ICD-10-CM

## 2018-08-06 DIAGNOSIS — R200 Anesthesia of skin: Secondary | ICD-10-CM | POA: Diagnosis not present

## 2018-08-06 DIAGNOSIS — R29810 Facial weakness: Secondary | ICD-10-CM

## 2018-08-06 DIAGNOSIS — J45909 Unspecified asthma, uncomplicated: Secondary | ICD-10-CM | POA: Diagnosis not present

## 2018-08-06 DIAGNOSIS — R079 Chest pain, unspecified: Secondary | ICD-10-CM | POA: Insufficient documentation

## 2018-08-06 LAB — DIFFERENTIAL
Abs Immature Granulocytes: 0.03 10*3/uL (ref 0.00–0.07)
Basophils Absolute: 0 10*3/uL (ref 0.0–0.1)
Basophils Relative: 0 %
Eosinophils Absolute: 0.2 10*3/uL (ref 0.0–0.5)
Eosinophils Relative: 1 %
Immature Granulocytes: 0 %
Lymphocytes Relative: 33 %
Lymphs Abs: 3.9 10*3/uL (ref 0.7–4.0)
Monocytes Absolute: 0.9 10*3/uL (ref 0.1–1.0)
Monocytes Relative: 7 %
Neutro Abs: 7 10*3/uL (ref 1.7–7.7)
Neutrophils Relative %: 59 %

## 2018-08-06 LAB — I-STAT CHEM 8, ED
BUN: 16 mg/dL (ref 6–20)
Calcium, Ion: 1.08 mmol/L — ABNORMAL LOW (ref 1.15–1.40)
Chloride: 105 mmol/L (ref 98–111)
Creatinine, Ser: 1 mg/dL (ref 0.44–1.00)
Glucose, Bld: 88 mg/dL (ref 70–99)
HCT: 39 % (ref 36.0–46.0)
Hemoglobin: 13.3 g/dL (ref 12.0–15.0)
Potassium: 3.4 mmol/L — ABNORMAL LOW (ref 3.5–5.1)
Sodium: 138 mmol/L (ref 135–145)
TCO2: 27 mmol/L (ref 22–32)

## 2018-08-06 LAB — POCT I-STAT, CHEM 8
BUN: 15 mg/dL (ref 6–20)
Calcium, Ion: 1.24 mmol/L (ref 1.15–1.40)
Chloride: 102 mmol/L (ref 98–111)
Creatinine, Ser: 1 mg/dL (ref 0.44–1.00)
Glucose, Bld: 88 mg/dL (ref 70–99)
HCT: 41 % (ref 36.0–46.0)
Hemoglobin: 13.9 g/dL (ref 12.0–15.0)
Potassium: 3.6 mmol/L (ref 3.5–5.1)
Sodium: 139 mmol/L (ref 135–145)
TCO2: 26 mmol/L (ref 22–32)

## 2018-08-06 LAB — CBG MONITORING, ED: Glucose-Capillary: 72 mg/dL (ref 70–99)

## 2018-08-06 LAB — COMPREHENSIVE METABOLIC PANEL
ALT: 19 U/L (ref 0–44)
AST: 25 U/L (ref 15–41)
Albumin: 3.7 g/dL (ref 3.5–5.0)
Alkaline Phosphatase: 101 U/L (ref 38–126)
Anion gap: 9 (ref 5–15)
BUN: 14 mg/dL (ref 6–20)
CO2: 22 mmol/L (ref 22–32)
Calcium: 9.1 mg/dL (ref 8.9–10.3)
Chloride: 105 mmol/L (ref 98–111)
Creatinine, Ser: 0.93 mg/dL (ref 0.44–1.00)
GFR calc Af Amer: >60 mL/min (ref >60)
GFR calc non Af Amer: >60 mL/min (ref >60)
Glucose, Bld: 89 mg/dL (ref 70–99)
Potassium: 3.3 mmol/L — ABNORMAL LOW (ref 3.5–5.1)
Sodium: 136 mmol/L (ref 135–145)
Total Bilirubin: 0.4 mg/dL (ref 0.3–1.2)
Total Protein: 7 g/dL (ref 6.5–8.1)

## 2018-08-06 LAB — GLUCOSE, CAPILLARY: Glucose-Capillary: 80 mg/dL (ref 70–99)

## 2018-08-06 LAB — CBC
HCT: 41.2 % (ref 36.0–46.0)
Hemoglobin: 12.3 g/dL (ref 12.0–15.0)
MCH: 24.9 pg — ABNORMAL LOW (ref 26.0–34.0)
MCHC: 29.9 g/dL — ABNORMAL LOW (ref 30.0–36.0)
MCV: 83.4 fL (ref 80.0–100.0)
Platelets: 229 10*3/uL (ref 150–400)
RBC: 4.94 MIL/uL (ref 3.87–5.11)
RDW: 12.7 % (ref 11.5–15.5)
WBC: 12 10*3/uL — ABNORMAL HIGH (ref 4.0–10.5)
nRBC: 0 % (ref 0.0–0.2)

## 2018-08-06 LAB — I-STAT TROPONIN, ED: Troponin i, poc: 0 ng/mL (ref 0.00–0.08)

## 2018-08-06 LAB — I-STAT BETA HCG BLOOD, ED (MC, WL, AP ONLY): I-stat hCG, quantitative: <5 m[IU]/mL (ref <5)

## 2018-08-06 LAB — PROTIME-INR
INR: 0.99
Prothrombin Time: 13 seconds (ref 11.4–15.2)

## 2018-08-06 LAB — APTT: aPTT: 31 seconds (ref 24–36)

## 2018-08-06 MED ORDER — DIPHENHYDRAMINE HCL 50 MG/ML IJ SOLN
12.5000 mg | Freq: Once | INTRAMUSCULAR | Status: AC
  Administered 2018-08-06: 12.5 mg via INTRAVENOUS
  Filled 2018-08-06: qty 1

## 2018-08-06 MED ORDER — PROCHLORPERAZINE EDISYLATE 10 MG/2ML IJ SOLN
5.0000 mg | Freq: Once | INTRAMUSCULAR | Status: AC
  Administered 2018-08-06: 5 mg via INTRAVENOUS
  Filled 2018-08-06: qty 2

## 2018-08-06 MED ORDER — SODIUM CHLORIDE 0.9 % IV BOLUS
1000.0000 mL | Freq: Once | INTRAVENOUS | Status: AC
  Administered 2018-08-06: 1000 mL via INTRAVENOUS

## 2018-08-06 MED ORDER — KETOROLAC TROMETHAMINE 15 MG/ML IJ SOLN
15.0000 mg | Freq: Once | INTRAMUSCULAR | Status: AC
  Administered 2018-08-06: 15 mg via INTRAVENOUS
  Filled 2018-08-06: qty 1

## 2018-08-06 NOTE — ED Notes (Signed)
Patient transported to MRI 

## 2018-08-06 NOTE — ED Notes (Signed)
Pt returned from MRI °

## 2018-08-06 NOTE — ED Notes (Signed)
Pt ambulated in room with no acute distress. Pt had steady gait

## 2018-08-06 NOTE — ED Notes (Signed)
Care link and charge nurse in ED notified of code stroke

## 2018-08-06 NOTE — ED Notes (Signed)
Patient verbalizes understanding of discharge instructions. Opportunity for questioning and answers were provided. Armband removed by staff, pt discharged from ED in wheelchair.  

## 2018-08-06 NOTE — Discharge Instructions (Signed)
Your work-up has been reassuring in the emergency department today.  Unknown cause your symptoms but no signs of a stroke today.  I would recommend that you follow-up with neurology and have given you an outpatient referral.  They will call you for an appointment.  If you develop any similar symptoms return the ED immediately.  And follow-up with your primary care doctor in the outpatient setting.

## 2018-08-06 NOTE — ED Triage Notes (Signed)
Pt here c/o left sided weakness and and slurred speech; pt LSN was 1400 today; pt with weakness noted to left hand and speech noted slurred

## 2018-08-06 NOTE — Consult Note (Addendum)
Neurology Consultation  Reason for Consult: Code stroke Referring Physician: Ayesha Rumpf  CC: Left-sided weakness and dysarthria and facial weakness  History is obtained from: EMS and patient  HPI: Joyce Welch is a 47 y.o. female with history of fibroids and asthma.  Patient apparently was in her car when she suddenly felt that she had slurred speech, left facial numbness, tingling.  She was driving to urgent care where slurred speech was note.  She also was noted to have left-sided weakness and decreased sensation on her face thus code stroke was called and patient was brought to [facility] via ambulance.  On arrival patient continued to have left-sided weakness, denied any decrease sensation on the left and no dysarthria was noted.   ED course labs were obtained, CT of head was obtained.  LKW: 1400 hrs. on 08/06/2018 tpa given?: no, minimal symptoms Premorbid modified Rankin scale (mRS): 0 Stroke scale of 2  ROS: A 14 point ROS was performed and is negative except as noted in the HPI.   Past Medical History:  Diagnosis Date  . Asthma   . Fibroids   . OAB (overactive bladder)      Family History  Problem Relation Age of Onset  . Breast cancer Maternal Aunt      Social History:   reports that she has never smoked. She has never used smokeless tobacco. She reports that she drinks alcohol. She reports that she does not use drugs.  Medications No current facility-administered medications for this encounter.   Current Outpatient Medications:  .  albuterol (PROVENTIL HFA;VENTOLIN HFA) 108 (90 BASE) MCG/ACT inhaler, Inhale 1 puff into the lungs daily. , Disp: , Rfl:  .  albuterol (PROVENTIL) (2.5 MG/3ML) 0.083% nebulizer solution, Take 2.5 mg by nebulization every 6 (six) hours as needed for wheezing or shortness of breath., Disp: , Rfl:  .  ALPRAZolam (XANAX) 0.25 MG tablet, Take 0.25 mg by mouth daily as needed for anxiety. , Disp: , Rfl: 0 .  azelastine (ASTELIN) 0.1 % nasal  spray, Place 1 spray into both nostrils 2 (two) times daily. Use in each nostril as directed, Disp: 30 mL, Rfl: 12 .  chlorpheniramine-HYDROcodone (TUSSIONEX) 10-8 MG/5ML SUER, Take 5 mLs by mouth every 12 (twelve) hours as needed for cough., Disp: 115 mL, Rfl: 0 .  fluticasone (FLONASE) 50 MCG/ACT nasal spray, Place into both nostrils daily., Disp: , Rfl:  .  furosemide (LASIX) 20 MG tablet, Take 1 tablet by mouth as needed., Disp: , Rfl: 1 .  loratadine (CLARITIN) 10 MG tablet, Take 10 mg by mouth daily., Disp: , Rfl:  .  mometasone-formoterol (DULERA) 100-5 MCG/ACT AERO, Inhale 2 puffs into the lungs 2 (two) times daily., Disp: 1 Inhaler, Rfl: 0 .  montelukast (SINGULAIR) 10 MG tablet, Take 1 tablet (10 mg total) by mouth at bedtime., Disp: 30 tablet, Rfl: 5 .  omeprazole (PRILOSEC) 40 MG capsule, Take 1 capsule (40 mg total) by mouth daily., Disp: 30 capsule, Rfl: 1 .  predniSONE (DELTASONE) 10 MG tablet, Take 4 tabs po daily x 3 days; then 3 tabs daily x3 days; then 2 tabs daily x3 days; then 1 tab daily x 3 days; then stop, Disp: 30 tablet, Rfl: 0   Exam: Current vital signs: There were no vitals taken for this visit. Vital signs in last 24 hours:    Physical Exam  Constitutional: Appears well-developed and well-nourished.  Psych: Affect appropriate to situation Eyes: No scleral injection HENT: No OP obstrucion Head:  Normocephalic.  Cardiovascular: Normal rate and regular rhythm.  Respiratory: Effort normal, non-labored breathing GI: Soft.  No distension. There is no tenderness.  Skin: WDI  Neuro: Mental Status: Patient is awake, alert, oriented to person, place, month, year, and situation. Patient is able to give a clear and coherent history. No signs of aphasia or neglect Cranial Nerves: II: Visual Fields are full.    III,IV, VI: EOMI without ptosis or diploplia. Pupils are equal, round, and reactive to light. V: Facial sensation is symmetric to temperature VII: Facial  movement is symmetric.  VIII: hearing is intact to voice X: Uvula elevates symmetrically XI: Shoulder shrug is symmetric. XII: tongue is midline without atrophy or fasciculations.  Motor: Tone is normal. Bulk is normal. 5/5 strength was present in all four extremities.  It should be noticed that patient on the left upper extremity has effort dependent giveaway weakness. Sensory: Sensation is symmetric to light touch and temperature in the arms and legs. Deep Tendon Reflexes: 3+ bilateral KJ and 2+ in AJ and UE  Plantars: Toes are downgoing bilaterally.  Cerebellar: FNF and HKS are intact bilaterally     Labs I have reviewed labs in epic and the results pertinent to this consultation are:   CBC    Component Value Date/Time   WBC 9.5 06/18/2018 1531   RBC 4.73 06/18/2018 1531   HGB 13.9 08/06/2018 1725   HCT 41.0 08/06/2018 1725   PLT 310.0 06/18/2018 1531   MCV 77.8 (L) 06/18/2018 1531   MCH 25.3 (L) 05/18/2018 1223   MCHC 31.9 06/18/2018 1531   RDW 13.7 06/18/2018 1531   LYMPHSABS 2.3 06/18/2018 1531   MONOABS 0.7 06/18/2018 1531   EOSABS 0.2 06/18/2018 1531   BASOSABS 0.1 06/18/2018 1531    CMP     Component Value Date/Time   NA 139 08/06/2018 1725   K 3.6 08/06/2018 1725   CL 102 08/06/2018 1725   CO2 29 05/18/2018 1223   GLUCOSE 88 08/06/2018 1725   BUN 15 08/06/2018 1725   CREATININE 1.00 08/06/2018 1725   CALCIUM 8.9 05/18/2018 1223   PROT 6.6 05/18/2018 1223   ALBUMIN 3.2 (L) 05/18/2018 1223   AST 23 05/18/2018 1223   ALT 17 05/18/2018 1223   ALKPHOS 88 05/18/2018 1223   BILITOT 0.5 05/18/2018 1223   GFRNONAA >60 05/18/2018 1223   GFRAA >60 05/18/2018 1223    Lipid Panel  No results found for: CHOL, TRIG, HDL, CHOLHDL, VLDL, LDLCALC, LDLDIRECT   Imaging I have reviewed the images obtained:  CT-scan of the brain--Subcentimeter lucency within right thalamus, possible lacunar infarction, age indeterminate    Etta Quill PA-C [facility] [phone]  M-F  (9:00 am- 5:00 PM)  08/06/2018, 5:46 PM   I have seen the patient and reviewed the above note.  Assessment:  47 year old female presenting to urgent care with sudden onset of left facial decreased sensation, left facial droop and left-sided weakness.  Patient was brought to [facility] secondary to code stroke.  Initial exam showed only left-sided drift on the arm and some effort dependent muscle strength.  CTA head was negative.  Patient did not receive TPA secondary to minimal symptoms.  I suspect that there is some degree of embellishment.  I would favor getting an MRI to ensure that there are not real symptoms underlying her embellishment, but if this is negative I would not pursue further work-up.   Recommendations: --MRI brain--If negative, no further work-up at this time  [facility]  Leonel Ramsay, MD [facility] [phone]  If 7pm- 7am, please page neurology on call as listed in [place].

## 2018-08-06 NOTE — ED Provider Notes (Addendum)
47 year old female with history of asthma, fibroids comes in for 3 hours of slurred speech, left facial numbness, tingling. She was able to walk to triage on her own. Alert and oriented x 4, slurred speech. Breathing unlabored and equal. She has left facial drooping and left sided weakness. Decreased sensation to the left face.  CBG 80. istat with normal creatinine.  Code stroke initiated, patient transferred by EMS to the emergency department for further evaluation and management needed.   Ok Edwards, PA-C 08/06/18 [place], Karis Emig V, PA-C 08/06/18 1740

## 2018-08-06 NOTE — ED Triage Notes (Signed)
Pt from urgent care via ems; pt driving around [id number] when she had sudden onset of left sided CP pain that radiated to L arm and leg;  Pt went to step on sidewalk to get into house, suddenly had weakness; niece drove pt to [facility] to check BP; pt found to be hypertensive, was told to go to urgent care; pt states "my left side of my face began to feel funny"; per urgent care staff, pt exhibiting L sided weakness, facial droop, and dysarthria; no history of same  164/72 CBG 72

## 2018-08-07 NOTE — ED Provider Notes (Signed)
[facility] EMERGENCY DEPARTMENT Provider Note   CSN: [id number] Arrival date & time: 08/06/18  1737   An emergency department physician performed an initial assessment on this suspected stroke patient at 1738.  History   Chief Complaint Chief Complaint  Patient presents with  . Code Stroke    HPI Joyce Welch is a 47 y.o. female.  HPI 47 year old female past medical history significant for asthma, fibroids presents to the ED for 3 hours of slurred speech, left facial numbness, tingling and left facial droop.  She reports some left-sided weakness as well.  Patient states that she was in her car when she suddenly felt the symptoms.  She went to urgent care who sent patient to the ED for code stroke.  Patient has no prior history of stroke.  At that time examination she states that she just feels "tired".  She reports a mild headache.  Denies any vision changes.  Denies any chest pain, shortness of breath, lightheadedness, dizziness.  She did not take anything for her symptoms prior to arrival.  No history of CVA or TIA.  She denies any history of hyperlipidemia, diabetes, hypertension.  Nothing makes her symptoms better or worse.  She reports the headache as dull in nature.  Denies any red flag symptoms including worse headache of her life, fevers, neck stiffness, night sweats, weight loss, maximal in onset.  Patient was seen by neurology on initial arrival to the ED and is currently pending work-up at this time.  Pt denies any fever, chill, vision changes, lightheadedness, dizziness, congestion, neck pain, cp, sob, cough, abd pain, n/v/d, urinary symptoms, change in bowel habits, melena, hematochezia, lower extremity paresthesias.  Past Medical History:  Diagnosis Date  . Asthma   . Fibroids   . OAB (overactive bladder)     Patient Active Problem List   Diagnosis Date Noted  . Cough 06/20/2018  . Post-nasal drip 06/20/2018  . Globus sensation 06/20/2018  . Asthma  exacerbation 10/31/2017  . Leukocytosis 10/31/2017  . Strain of quadriceps tendon 02/14/2014  . Symptomatic menopausal or female climacteric states 07/31/2013    Past Surgical History:  Procedure Laterality Date  . ABDOMINAL HYSTERECTOMY  02/05/2006   partial  . [place], 2002, 2005,    x 3     OB History    Gravida  3   Para  3   Term  1   Preterm      AB      Living  3     SAB      TAB      Ectopic      Multiple      Live Births  3            Home Medications    Prior to Admission medications   Medication Sig Start Date End Date Taking? Authorizing Provider  albuterol (PROVENTIL HFA;VENTOLIN HFA) 108 (90 BASE) MCG/ACT inhaler Inhale 1 puff into the lungs every 6 (six) hours as needed for wheezing or shortness of breath.    Yes [provider]  albuterol (PROVENTIL) (2.5 MG/3ML) 0.083% nebulizer solution Take 2.5 mg by nebulization every 6 (six) hours as needed for wheezing or shortness of breath.   Yes [provider]  azelastine (ASTELIN) 0.1 % nasal spray Place 1 spray into both nostrils 2 (two) times daily. Use in each nostril as directed Patient taking differently: Place 1 spray into both nostrils See admin instructions. Instill 1 spray  into each nostril two times a day as directed 06/04/18  Yes [name], MD  fluticasone (FLONASE) 50 MCG/ACT nasal spray Place 2 sprays into both nostrils daily.    Yes [provider]  furosemide (LASIX) 20 MG tablet Take 1 tablet by mouth as needed (for swelling of the legs).  04/15/18  Yes [provider]  ibuprofen (ADVIL,MOTRIN) 200 MG tablet Take 200-400 mg by mouth every 6 (six) hours as needed (for pain or headaches).    Yes [provider]  loratadine (CLARITIN) 10 MG tablet Take 10 mg by mouth daily.   Yes [provider]  mometasone-formoterol (DULERA) 100-5 MCG/ACT AERO Inhale 2 puffs into the lungs 2 (two) times daily. 07/12/18  Yes Fenton Foy, NP  montelukast (SINGULAIR) 10 MG tablet Take 1 tablet (10 mg total) by mouth at bedtime. 06/04/18  Yes [name], MD  ALPRAZolam Duanne Moron) 0.25 MG tablet Take 0.25 mg by mouth daily as needed for anxiety.  10/29/17   [provider]  chlorpheniramine-HYDROcodone (TUSSIONEX) 10-8 MG/5ML SUER Take 5 mLs by mouth every 12 (twelve) hours as needed for cough. Patient not taking: Reported on 08/06/2018 11/01/17   Geradine Girt, DO  omeprazole (PRILOSEC) 40 MG capsule Take 1 capsule (40 mg total) by mouth daily. 06/04/18   [name], MD  predniSONE (DELTASONE) 10 MG tablet Take 4 tabs po daily x 3 days; then 3 tabs daily x3 days; then 2 tabs daily x3 days; then 1 tab daily x 3 days; then stop Patient not taking: Reported on 08/06/2018 06/18/18   Martyn Ehrich, NP    Family History Family History  Problem Relation Age of Onset  . Breast cancer Maternal Aunt     Social History Social History   Tobacco Use  . Smoking status: Never Smoker  . Smokeless tobacco: Never Used  Substance Use Topics  . Alcohol use: Yes  . Drug use: No     Allergies   Patient has no known allergies.   Review of Systems Review of Systems  All other systems reviewed and are negative.    Physical Exam Updated Vital Signs BP 132/83   Pulse 84   Temp 98.6 F (37 C) (Oral)   Resp 18   Ht 5\' 6"  (1.676 m)   Wt 113.2 kg   SpO2 99%   BMI 40.28 kg/m   Physical Exam  Constitutional: She is oriented to person, place, and time. She appears well-developed and well-nourished.  Non-toxic appearance. No distress.  HENT:  Head: Normocephalic and atraumatic.  Nose: Nose normal.  Mouth/Throat: Oropharynx is clear and moist.  Eyes: Pupils are equal, round, and reactive to light. Conjunctivae are normal. Right eye exhibits no discharge. Left eye exhibits no discharge.  Neck: Normal range of motion. Neck supple.  Cardiovascular: Normal rate, regular rhythm, normal heart sounds and intact distal  pulses.  Pulmonary/Chest: Effort normal and breath sounds normal. No respiratory distress. She exhibits no tenderness.  Abdominal: Soft. Bowel sounds are normal. There is no tenderness. There is no rebound and no guarding.  Musculoskeletal: Normal range of motion. She exhibits no tenderness.  Lymphadenopathy:    She has no cervical adenopathy.  Neurological: She is alert and oriented to person, place, and time.  The patient is alert, attentive, and oriented x 3. Speech is clear. Cranial nerve II-VII grossly intact. Negative pronator drift. Sensation intact. Strength 5/5 in all extremities. Reflexes 2+ and symmetric at biceps, triceps, knees, and ankles. Rapid  alternating movement and fine finger movements intact. Romberg is absent.    Skin: Skin is warm and dry. Capillary refill takes less than 2 seconds.  Psychiatric: Her behavior is normal. Judgment and thought content normal.  Nursing note and vitals reviewed.    ED Treatments / Results  Labs (all labs ordered are listed, but only abnormal results are displayed) Labs Reviewed  CBC - Abnormal; Notable for the following components:      Result Value   WBC 12.0 (*)    MCH 24.9 (*)    MCHC 29.9 (*)    All other components within normal limits  COMPREHENSIVE METABOLIC PANEL - Abnormal; Notable for the following components:   Potassium 3.3 (*)    All other components within normal limits  I-STAT CHEM 8, ED - Abnormal; Notable for the following components:   Potassium 3.4 (*)    Calcium, Ion 1.08 (*)    All other components within normal limits  PROTIME-INR  APTT  DIFFERENTIAL  I-STAT TROPONIN, ED  CBG MONITORING, ED  I-STAT BETA HCG BLOOD, ED ([facility], [facility], [facility] ONLY)    EKG EKG Interpretation  Date/Time:  Tuesday August 06 2018 17:55:20 EST Ventricular Rate:  93 PR Interval:    QRS Duration: 74 QT Interval:  350 QTC Calculation: 436 R Axis:   16 Text Interpretation:  Sinus rhythm Left atrial enlargement Low voltage,  precordial leads Abnormal R-wave progression, early transition Confirmed by Quintella Reichert [phone]) on 08/06/2018 6:00:55 PM   Radiology Mr Brain Wo Contrast (neuro Protocol)  Result Date: 08/06/2018 CLINICAL DATA:  47 y/o F; episode of left facial numbness, left-sided weakness, and slurred speech with persistent left-sided weakness. EXAM: MRI HEAD WITHOUT CONTRAST TECHNIQUE: Sagittal T1 FLAIR, axial DWI, coronal DWI, axial T2 propeller, axial T2 FLAIR propeller sequences were acquired. The patient declined to continue the examination and additional sequences were not acquired. COMPARISON:  07/06/2018 CT head FINDINGS: Brain: No acute infarction, hemorrhage, hydrocephalus, extra-axial collection or mass lesion. No structural or signal abnormality of the brain identified. Vascular: Normal flow voids. Skull and upper cervical spine: Normal marrow signal. Sinuses/Orbits: Bilateral maxillary sinus mucosal thickening with inspissated right maxillary sinus secretions. No abnormal signal of the additional paranasal sinuses or the mastoid air cells. Orbits are unremarkable. Other: None. IMPRESSION: No acute intracranial abnormality identified. Unremarkable MRI of the brain. No signal abnormality corresponding to question lacunar infarct on prior CT of head, likely CT artifact. Electronically Signed   By: Kristine Garbe M.D.   On: 08/06/2018 21:06   Ct Head Code Stroke Wo Contrast  Result Date: 08/06/2018 CLINICAL DATA:  Code stroke. 47 y/o F; left-sided weakness and facial numbness. EXAM: CT HEAD WITHOUT CONTRAST TECHNIQUE: Contiguous axial images were obtained from the base of the skull through the vertex without intravenous contrast. COMPARISON:  None. FINDINGS: Brain: Subcentimeter hypodensity within the right thalamus, possible lacunar infarction, age indeterminate (series 3, image 49). No additional findings of acute stroke, hemorrhage, focal mass effect, extra-axial collection, hydrocephalus, or  herniation. Vascular: No hyperdense vessel or unexpected calcification. Skull: Normal. Negative for fracture or focal lesion. Sinuses/Orbits: No acute finding. Other: None. ASPECTS [facility] Stroke Program Early CT Score) - Ganglionic level infarction (caudate, lentiform nuclei, internal capsule, insula, M1-M3 cortex): 7 - Supraganglionic infarction (M4-M6 cortex): 3 Total score (0-10 with 10 being normal): 10 IMPRESSION: 1. Subcentimeter lucency within right thalamus, possible lacunar infarction, age indeterminate. 2. Otherwise unremarkable CT of head. 3. ASPECTS is 10. These results were called by telephone  at the time of interpretation on 08/06/2018 at 5:59 pm to Dr. Leonel Ramsay, who verbally acknowledged these results. Electronically Signed   By: Kristine Garbe M.D.   On: 08/06/2018 17:58    Procedures Procedures (including critical care time)  Medications Ordered in ED Medications  sodium chloride 0.9 % bolus 1,000 mL (0 mLs Intravenous Stopped 08/06/18 2234)  ketorolac (TORADOL) 15 MG/ML injection 15 mg (15 mg Intravenous Given 08/06/18 2007)  prochlorperazine (COMPAZINE) injection 5 mg (5 mg Intravenous Given 08/06/18 2006)  diphenhydrAMINE (BENADRYL) injection 12.5 mg (12.5 mg Intravenous Given 08/06/18 2007)     Initial Impression / Assessment and Plan / ED Course  I have reviewed the triage vital signs and the nursing notes.  Pertinent labs & imaging results that were available during my care of the patient were reviewed by me and considered in my medical decision making (see chart for details).     Patient presented to the ED for evaluation of left facial numbness, slurred speech, left-sided weakness and facial droop.  Patient was initially a code stroke.  My examination patient had no focal neurological deficit.  Otherwise exam was reassuring.  Lab work shows a nonspecific leukocytosis of 12,000.  Otherwise labs are reassuring.  Mild hypokalemia 3.4.  CTA of head was negative.   Troponin was negative.  EKG shows normal sinus rhythm.  Dr. Leonel Ramsay who evaluated patient felt that if patient has a normal MRI that she can be discharged home without any further work-up as she may have some degree of embellishment.  Patient does report a slight headache and I did prescribe her a migraine cocktail which improved her headache.  MRI performed was negative.  Even the patient has no focal neurological deficit at this time and has been able to ambulate with normal gait patient to be discharged home with neurology ambulatory referral.   Pt is hemodynamically stable, in NAD, & able to ambulate in the ED. Evaluation does not show pathology that would require ongoing emergent intervention or inpatient treatment. I explained the diagnosis to the patient. Pain has been managed & has no complaints prior to dc. Pt is comfortable with above plan and is stable for discharge at this time. All questions were answered prior to disposition. Strict return precautions for f/u to the ED were discussed. Encouraged follow up with PCP.    Final Clinical Impressions(s) / ED Diagnoses   Final diagnoses:  Weakness  Facial droop    ED Discharge Orders         Ordered    Ambulatory referral to Neurology    Comments:  An appointment is requested in approximately: 1-2 weeks   08/06/18 2218           Aaron Edelman 08/07/18 0143    Quintella Reichert, MD 08/12/18 [phone]

## 2018-08-08 DIAGNOSIS — R053 Chronic cough: Secondary | ICD-10-CM | POA: Insufficient documentation

## 2018-08-08 DIAGNOSIS — J320 Chronic maxillary sinusitis: Secondary | ICD-10-CM | POA: Insufficient documentation

## 2018-08-12 ENCOUNTER — Encounter: Payer: Self-pay | Admitting: Pulmonary Disease

## 2018-08-12 ENCOUNTER — Ambulatory Visit (INDEPENDENT_AMBULATORY_CARE_PROVIDER_SITE_OTHER): Payer: Medicaid Other | Admitting: Pulmonary Disease

## 2018-08-12 VITALS — BP 122/84 | HR 90 | Ht 65.0 in | Wt 243.0 lb

## 2018-08-12 DIAGNOSIS — R059 Cough, unspecified: Secondary | ICD-10-CM

## 2018-08-12 DIAGNOSIS — J45901 Unspecified asthma with (acute) exacerbation: Secondary | ICD-10-CM

## 2018-08-12 DIAGNOSIS — K219 Gastro-esophageal reflux disease without esophagitis: Secondary | ICD-10-CM | POA: Diagnosis not present

## 2018-08-12 DIAGNOSIS — R058 Other specified cough: Secondary | ICD-10-CM

## 2018-08-12 DIAGNOSIS — R05 Cough: Secondary | ICD-10-CM | POA: Diagnosis not present

## 2018-08-12 DIAGNOSIS — B49 Unspecified mycosis: Secondary | ICD-10-CM

## 2018-08-12 NOTE — Progress Notes (Signed)
[facility]  Chief Complaint  Patient presents with  . Follow-up    Pt has increase of productive cough-clear, tired and in last 2 weeks.    Constitutional:  BP 122/84 (BP Location: Left Arm, Cuff Size: Normal)   Pulse 90   Ht 5\' 5"  (1.651 m)   Wt 243 lb (110.2 kg)   SpO2 100%   BMI 40.44 kg/m   Past Medical History:  Fibroids, Overactive bladder  Brief Summary:  Joyce Welch is a 47 y.o. female with chronic cough.  Since her last visit with me she had lab test, CT chest and CT sinus.  CT chest and lab tests were unremarkable.  CT sinus showed area in Rt maxillary sinus concerning for a fungus ball.  She was seen by Dr. Redmond Baseman, and recommended surgery.  She still has post nasal drip and cough. She has to clear her throat frequently.  Was told to double dose of PPI and just started higher dose.  She feels that dulera helps.     Physical Exam:   Appearance - well kempt   ENMT - clear nasal drainage, boggy nasal mucosa, midline nasal septum, no oral exudates, no LAN, trachea midline  Respiratory - normal chest wall, normal respiratory effort, no accessory muscle use, no wheeze/rales  CV - s1s2 regular rate and rhythm, no murmurs, no peripheral edema, radial pulses symmetric  GI - soft, non tender, no masses  Lymph - no adenopathy noted in neck and axillary areas  MSK - normal gait  Ext - no cyanosis, clubbing, or joint inflammation noted  Skin - no rashes, lesions, or ulcers  Neuro - normal strength, oriented x 3  Psych - normal mood and affect   Assessment/Plan:   Upper airway cough syndrome. - found to have Rt maxillary fungus ball  - she will call Dr. Redmond Baseman office to arrange for Rt maxillary antrostomy - continue nasal irrigation, flonase, claritin, astelin, singulair  Asthma. - continue dulera, singulair, and prn albuterol  Laryngopharyngeal reflux. - continue omeprazole 40 mg bid for now   Patient  Instructions  Follow up in 4 months    [name], MD [place] Pager: [phone] 08/12/2018, 3:09 PM  Flow Sheet     Pulmonary tests:  RAST 06/18/18 >> cockroach, IgE 57 CT sinus 07/29/18 >> moderate Rt maxillary sinus thickening CT chest 07/29/18 >> normal   Medications:   Allergies as of 08/12/2018   No Known Allergies     Medication List        Accurate as of 08/12/18  3:09 PM. Always use your most recent med list.          albuterol 108 (90 Base) MCG/ACT inhaler Commonly known as:  PROVENTIL HFA;VENTOLIN HFA Inhale 1 puff into the lungs every 6 (six) hours as needed for wheezing or shortness of breath.   albuterol (2.5 MG/3ML) 0.083% nebulizer solution Commonly known as:  PROVENTIL Take 2.5 mg by nebulization every 6 (six) hours as needed for wheezing or shortness of breath.   ALPRAZolam 0.25 MG tablet Commonly known as:  XANAX Take 0.25 mg by mouth daily as needed for anxiety.   azelastine 0.1 % nasal spray Commonly known as:  ASTELIN Place 1 spray into both nostrils 2 (two) times daily. Use in each nostril as directed   fluticasone 50 MCG/ACT nasal spray Commonly known as:  FLONASE Place 2 sprays into both nostrils daily.   furosemide 20 MG tablet Commonly  known as:  LASIX Take 1 tablet by mouth as needed (for swelling of the legs).   ibuprofen 200 MG tablet Commonly known as:  ADVIL,MOTRIN Take 200-400 mg by mouth every 6 (six) hours as needed (for pain or headaches).   loratadine 10 MG tablet Commonly known as:  CLARITIN Take 10 mg by mouth daily.   mometasone-formoterol 100-5 MCG/ACT Aero Commonly known as:  DULERA Inhale 2 puffs into the lungs 2 (two) times daily.   montelukast 10 MG tablet Commonly known as:  SINGULAIR Take 1 tablet (10 mg total) by mouth at bedtime.   omeprazole 40 MG capsule Commonly known as:  PRILOSEC Take 1 capsule (40 mg total) by mouth daily.       Past Surgical History:  She   has a past surgical history that includes Cesarean section (1994, 2002, 2005,) and Abdominal hysterectomy (02/05/2006).  Family History:  Her family history includes Breast cancer in her maternal aunt.  Social History:  She  reports that she has never smoked. She has never used smokeless tobacco. She reports that she drinks alcohol. She reports that she does not use drugs.

## 2018-08-12 NOTE — Patient Instructions (Signed)
Follow up in 4 months 

## 2018-08-15 ENCOUNTER — Telehealth: Payer: Self-pay | Admitting: Physical Therapy

## 2018-08-15 NOTE — Telephone Encounter (Signed)
Called patient to check on status for physical therapy-patient not seen since 07/08/18. Left voicemail to call/schedule versus inform clinic if wishing to discharge.

## 2018-08-18 ENCOUNTER — Ambulatory Visit (HOSPITAL_COMMUNITY)
Admission: EM | Admit: 2018-08-18 | Discharge: 2018-08-18 | Disposition: A | Discharge status: Home / Self Care | Payer: Medicaid Other | Attending: Family Medicine | Admitting: Family Medicine

## 2018-08-18 ENCOUNTER — Encounter (HOSPITAL_COMMUNITY): Payer: Self-pay | Admitting: Emergency Medicine

## 2018-08-18 ENCOUNTER — Other Ambulatory Visit: Payer: Self-pay

## 2018-08-18 DIAGNOSIS — R05 Cough: Secondary | ICD-10-CM | POA: Diagnosis not present

## 2018-08-18 DIAGNOSIS — R220 Localized swelling, mass and lump, head: Secondary | ICD-10-CM

## 2018-08-18 DIAGNOSIS — J40 Bronchitis, not specified as acute or chronic: Secondary | ICD-10-CM

## 2018-08-18 DIAGNOSIS — R059 Cough, unspecified: Secondary | ICD-10-CM

## 2018-08-18 DIAGNOSIS — R509 Fever, unspecified: Secondary | ICD-10-CM

## 2018-08-18 DIAGNOSIS — J3489 Other specified disorders of nose and nasal sinuses: Secondary | ICD-10-CM

## 2018-08-18 MED ORDER — IBUPROFEN 600 MG PO TABS: 600.0000 mg | ORAL_TABLET | Freq: Four times a day (QID) | ORAL | 0 refills | Status: DC | PRN

## 2018-08-18 MED ORDER — DOXYCYCLINE HYCLATE 100 MG PO CAPS: 100.0000 mg | ORAL_CAPSULE | Freq: Two times a day (BID) | ORAL | 0 refills | Status: AC

## 2018-08-18 MED ORDER — ACETAMINOPHEN 325 MG PO TABS
650.0000 mg | ORAL_TABLET | Freq: Once | ORAL | Status: AC
  Administered 2018-08-18: 650 mg via ORAL

## 2018-08-18 MED ORDER — ACETAMINOPHEN 325 MG PO TABS
ORAL_TABLET | ORAL | Status: AC
  Filled 2018-08-18: qty 2

## 2018-08-18 NOTE — ED Triage Notes (Signed)
The patient presented to the [facility] with a complaint of a cough x 4 days and a fever that started today.

## 2018-08-18 NOTE — Discharge Instructions (Signed)
Recommend start Doxycycline 100mg  twice a day as directed. Continue Tessalon cough pills, Flonase, Singulair, Claritin, Dulera and Albuterol as directed. May take Ibuprofen 600mg  every 6 hours as needed for fever. Continue to push fluids to help loosen up mucus in chest. Follow-up with your ENT in 2 to 3 days if not improving.

## 2018-08-18 NOTE — ED Provider Notes (Signed)
[place]    CSN: [id number] Arrival date & time: 08/18/18  1237     History   Chief Complaint Chief Complaint  Patient presents with  . Cough  . Fever    HPI Joyce Welch is a 47 y.o. female.   47 year old female presents with sore throat, cough and chest congestion that started 4 days ago. Has been coughing up yellowish mucus and wheezing. Today felt more tired and warm. Fever was 100 at home and almost 101 here. Did not take anything yet for fever at home but was given Tylenol 650mg  in triage. Also having some sinus pain and congestion, but denies any GI symptoms. Has history of asthma and recurrent cough. Has seen an ENT and Pulmonologist recently. On 08/12/18 she was evaluated at the Pulmonologist for chronic cough and asthma and appeared stable on her current medication regimen of Claritin, Flonase, Astelin, Singulair, Dulera and Albuterol inhaler as needed. They had increased her Prilosec to BID. On recent CT scan of her sinuses, discovered a probable fungal ball and she was referred to ENT. She is scheduled for surgery for next week on 08/26/18 and needs to be "well" for her surgery. No other family members ill.   The history is provided by the patient.    Past Medical History:  Diagnosis Date  . Asthma   . Fibroids   . OAB (overactive bladder)     Patient Active Problem List   Diagnosis Date Noted  . Cough 06/20/2018  . Post-nasal drip 06/20/2018  . Globus sensation 06/20/2018  . Asthma exacerbation 10/31/2017  . Leukocytosis 10/31/2017  . Strain of quadriceps tendon 02/14/2014  . Symptomatic menopausal or female climacteric states 07/31/2013    Past Surgical History:  Procedure Laterality Date  . ABDOMINAL HYSTERECTOMY  02/05/2006   partial  . [place], 2002, 2005,    x 3    OB History    Gravida  3   Para  3   Term  1   Preterm      AB      Living  3     SAB      TAB      Ectopic      Multiple      Live  Births  3            Home Medications    Prior to Admission medications   Medication Sig Start Date End Date Taking? Authorizing Provider  albuterol (PROVENTIL HFA;VENTOLIN HFA) 108 (90 BASE) MCG/ACT inhaler Inhale 1 puff into the lungs every 6 (six) hours as needed for wheezing or shortness of breath.    Yes [provider]  albuterol (PROVENTIL) (2.5 MG/3ML) 0.083% nebulizer solution Take 2.5 mg by nebulization every 6 (six) hours as needed for wheezing or shortness of breath.   Yes [provider]  azelastine (ASTELIN) 0.1 % nasal spray Place 1 spray into both nostrils 2 (two) times daily. Use in each nostril as directed Patient taking differently: Place 1 spray into both nostrils See admin instructions. Instill 1 spray into each nostril two times a day as directed 06/04/18  Yes Sood, Elisabeth Cara, MD  benzonatate (TESSALON PERLES) 100 MG capsule Take by mouth 3 (three) times daily as needed for cough.   Yes [provider]  fluticasone (FLONASE) 50 MCG/ACT nasal spray Place 2 sprays into both nostrils daily.    Yes [provider]  loratadine (CLARITIN) 10 MG tablet Take  10 mg by mouth daily.   Yes [provider]  mometasone-formoterol (DULERA) 100-5 MCG/ACT AERO Inhale 2 puffs into the lungs 2 (two) times daily. 07/12/18  Yes Fenton Foy, NP  montelukast (SINGULAIR) 10 MG tablet Take 1 tablet (10 mg total) by mouth at bedtime. 06/04/18  Yes [name], MD  omeprazole (PRILOSEC) 40 MG capsule Take 1 capsule (40 mg total) by mouth daily. 06/04/18  Yes [name], MD  doxycycline (VIBRAMYCIN) 100 MG capsule Take 1 capsule (100 mg total) by mouth 2 (two) times daily for 7 days. 08/18/18 08/25/18  Katy Apo, NP  ibuprofen (ADVIL,MOTRIN) 600 MG tablet Take 1 tablet (600 mg total) by mouth every 6 (six) hours as needed for fever or moderate pain. 08/18/18   Katy Apo, NP    Family History Family History  Problem Relation Age of Onset    . Breast cancer Maternal Aunt     Social History Social History   Tobacco Use  . Smoking status: Never Smoker  . Smokeless tobacco: Never Used  Substance Use Topics  . Alcohol use: Yes  . Drug use: No     Allergies   Patient has no known allergies.   Review of Systems Review of Systems  Constitutional: Positive for appetite change, fatigue and fever. Negative for activity change and chills.  HENT: Positive for congestion, postnasal drip, rhinorrhea, sinus pressure and sore throat. Negative for ear discharge, ear pain, facial swelling, mouth sores, nosebleeds, sinus pain, sneezing and trouble swallowing.   Eyes: Negative for pain, discharge, redness and itching.  Respiratory: Positive for cough and wheezing. Negative for chest tightness, shortness of breath and stridor.   Cardiovascular: Negative for chest pain and palpitations.  Gastrointestinal: Negative for abdominal pain, diarrhea, nausea and vomiting.  Musculoskeletal: Negative for arthralgias, back pain, myalgias, neck pain and neck stiffness.  Skin: Negative for color change, rash and wound.  Allergic/Immunologic: Positive for environmental allergies.  Neurological: Negative for dizziness, tremors, seizures, syncope, facial asymmetry, speech difficulty, weakness, light-headedness, numbness and headaches.  Hematological: Negative for adenopathy. Does not bruise/bleed easily.     Physical Exam Triage Vital Signs ED Triage Vitals  Enc Vitals Group     BP 08/18/18 1256 130/86     Pulse Rate 08/18/18 1256 (!) 124     Resp 08/18/18 1256 18     Temp 08/18/18 1256 (!) 100.8 F (38.2 C)     Temp Source 08/18/18 1256 Oral     SpO2 08/18/18 1256 97 %     Weight --      Height --      Head Circumference --      Peak Flow --      Pain Score 08/18/18 1254 0     Pain Loc --      Pain Edu? --      Excl. in [place]? --    No data found.  Updated Vital Signs BP 130/86 (BP Location: Left Arm)   Pulse (!) 124   Temp (!)  100.8 F (38.2 C) (Oral)   Resp 18   SpO2 97%   Visual Acuity Right Eye Distance:   Left Eye Distance:   Bilateral Distance:    Right Eye Near:   Left Eye Near:    Bilateral Near:     Physical Exam  Constitutional: She is oriented to person, place, and time. She appears well-developed and well-nourished. She is cooperative. She appears ill. No distress.  Patient lying down on  exam table in no acute distress but appears ill.   HENT:  Head: Normocephalic and atraumatic.  Right Ear: Hearing, tympanic membrane, external ear and ear canal normal.  Left Ear: Hearing, tympanic membrane, external ear and ear canal normal.  Nose: Mucosal edema present. Right sinus exhibits maxillary sinus tenderness and frontal sinus tenderness. Left sinus exhibits maxillary sinus tenderness and frontal sinus tenderness.  Mouth/Throat: Uvula is midline and mucous membranes are normal. Posterior oropharyngeal erythema present.  Eyes: Conjunctivae and EOM are normal.  Neck: Normal range of motion. Neck supple.  Cardiovascular: Regular rhythm and normal heart sounds. Tachycardia present.  Pulmonary/Chest: Effort normal. No stridor. No respiratory distress. She has decreased breath sounds in the right upper field and the left upper field. She has wheezes in the right upper field and the left upper field. She has rhonchi in the right upper field, the right lower field, the left upper field and the left lower field. She has no rales.  Musculoskeletal: Normal range of motion.  Lymphadenopathy:    She has no cervical adenopathy.  Neurological: She is alert and oriented to person, place, and time.  Skin: Skin is warm and dry. Capillary refill takes less than 2 seconds. No rash noted.  Psychiatric: She has a normal mood and affect. Her behavior is normal. Judgment and thought content normal.  Vitals reviewed.    UC Treatments / Results  Labs (all labs ordered are listed, but only abnormal results are  displayed) Labs Reviewed - No data to display  EKG None  Radiology No results found.  Procedures Procedures (including critical care time)  Medications Ordered in UC Medications  acetaminophen (TYLENOL) tablet 650 mg (650 mg Oral Given 08/18/18 1301)    Initial Impression / Assessment and Plan / UC Course  I have reviewed the triage vital signs and the nursing notes.  Pertinent labs & imaging results that were available during my care of the patient were reviewed by me and considered in my medical decision making (see chart for details).    Discussed that she may have bronchitis with minor sinus infection. Do not feel this is influenza since fever developed later and minimal body aches. Since she is having surgery to remove possible fungal ball in right sinuses next week, will place her on antibiotics in case current illness is bacterial. Patient indicated she was recently on Augmentin and Zithromax with minimal success. Will trial Doxycycline 100mg  twice a day as directed. May continue Tessalon cough pills, Flonase, Claritin, Singulair, Astelin, Dulera and Albuterol as directed. May take Ibuprofen 600mg  every 6 hours as needed for fever and pain. Only take for 2 days since she needs to be off NSAIDs for at least 5 days before surgery. Note written for work. Follow-up with her ENT in 2 to 3 days if not improving.   Final Clinical Impressions(s) / UC Diagnoses   Final diagnoses:  Cough  Bronchitis  Fever, unspecified  Maxillary sinus mass     Discharge Instructions     Recommend start Doxycycline 100mg  twice a day as directed. Continue Tessalon cough pills, Flonase, Singulair, Claritin, Dulera and Albuterol as directed. May take Ibuprofen 600mg  every 6 hours as needed for fever. Continue to push fluids to help loosen up mucus in chest. Follow-up with your ENT in 2 to 3 days if not improving.     ED Prescriptions    Medication Sig Dispense Auth. Provider   doxycycline  (VIBRAMYCIN) 100 MG capsule Take 1 capsule (100 mg total)  by mouth 2 (two) times daily for 7 days. 14 capsule Katy Apo, NP   ibuprofen (ADVIL,MOTRIN) 600 MG tablet Take 1 tablet (600 mg total) by mouth every 6 (six) hours as needed for fever or moderate pain. 10 tablet Katy Apo, NP     Controlled Substance Prescriptions [place] Controlled Substance Registry consulted? Not Applicable   Katy Apo, NP 08/19/18 1110

## 2018-08-21 NOTE — Therapy (Signed)
[place], [place], [address] Phone: [phone]   Fax:  [phone]  Physical Therapy Treatment/Discharge  Patient Details  Name: Joyce Welch MRN: [id number] Date of Birth: [birth year] Referring Provider (PT): Ninfa Linden, C.   Encounter Date: 07/08/2018    Past Medical History:  Diagnosis Date  . Asthma   . Fibroids   . OAB (overactive bladder)     Past Surgical History:  Procedure Laterality Date  . ABDOMINAL HYSTERECTOMY  02/05/2006   partial  . [place], 2002, 2005,    x 3    There were no vitals filed for this visit.                                 PT Long Term Goals - 07/08/18 1608      PT LONG TERM GOAL #1   Title  Pt will be I and compliant with HEP. 4 weeks from eval 07/08/18    Baseline  not compliant with HEP at eval, reports performing, goal met    Time  4    Period  Weeks    Status  Achieved      PT LONG TERM GOAL #2   Title  Pt will improve pain to overall less than 3/10 pain with usual activity and work activity. 4 weeks 08/05/18    Baseline  pain 4/10 this PM, variable intermittent symptoms with pain level higher at times    Time  4    Period  Weeks    Status  On-going      PT LONG TERM GOAL #3   Title  Pt will improve Lt LE strength to overall 4+/5 MMT to improve function. 4 weeks 08/05/18    Baseline  4/5 MMT    Time  4    Period  Weeks    Status  On-going      PT LONG TERM GOAL #4   Title  Tolerate standing/ambulation periods at least 30-40 minutes for work duties and community mobility for activities such as grocery shopping with pain 3/10 or less    Baseline  during exacerbations limited to 5-10 minutes, pain 4/10 this PM    Time  4    Period  Weeks    Status  New              Patient will benefit from skilled therapeutic intervention in order to improve the following deficits and impairments:  Decreased activity  tolerance, Decreased endurance, Decreased range of motion, Decreased strength, Hypomobility, Difficulty walking, Impaired flexibility, Postural dysfunction, Obesity, Pain, Abnormal gait  Visit Diagnosis: Pain in left hip - Plan: PT plan of care cert/re-cert  Trochanteric bursitis of left hip - Plan: PT plan of care cert/re-cert     Problem List Patient Active Problem List   Diagnosis Date Noted  . Cough 06/20/2018  . Post-nasal drip 06/20/2018  . Globus sensation 06/20/2018  . Asthma exacerbation 10/31/2017  . Leukocytosis 10/31/2017  . Strain of quadriceps tendon 02/14/2014  . Symptomatic menopausal or female climacteric states 07/31/2013        PHYSICAL THERAPY DISCHARGE SUMMARY  Visits from Start of Care: 4  Current functional level related to goals / functional outcomes: Unkown-pt. Did not return for further therapy after last visit 07/08/18   Remaining deficits: Unkown   Education / Equipment: NA Plan: Patient agrees to discharge.  Patient goals  were not met. Patient is being discharged due to not returning since the last visit.  ?????           Beaulah Dinning, PT, DPT 08/21/18 3:16 PM       [facility] [address] [place], [place], [address] Phone: [phone]   Fax:  [phone]  Name: AMBERLI RUEGG MRN: [id number] Date of Birth: 06/01/1971

## 2018-09-24 DIAGNOSIS — J019 Acute sinusitis, unspecified: Secondary | ICD-10-CM | POA: Insufficient documentation

## 2018-09-27 ENCOUNTER — Other Ambulatory Visit: Payer: Self-pay

## 2018-09-27 ENCOUNTER — Encounter (HOSPITAL_BASED_OUTPATIENT_CLINIC_OR_DEPARTMENT_OTHER): Payer: Self-pay | Admitting: *Deleted

## 2018-10-07 ENCOUNTER — Ambulatory Visit (HOSPITAL_BASED_OUTPATIENT_CLINIC_OR_DEPARTMENT_OTHER): Payer: Medicaid Other | Admitting: Certified Registered"

## 2018-10-07 ENCOUNTER — Ambulatory Visit (HOSPITAL_BASED_OUTPATIENT_CLINIC_OR_DEPARTMENT_OTHER)
Admission: RE | Admit: 2018-10-07 | Discharge: 2018-10-07 | Disposition: A | Discharge status: Home / Self Care | Payer: Medicaid Other | Attending: Otolaryngology | Admitting: Otolaryngology

## 2018-10-07 ENCOUNTER — Encounter (HOSPITAL_BASED_OUTPATIENT_CLINIC_OR_DEPARTMENT_OTHER)
Admission: RE | Disposition: A | Discharge status: Home / Self Care | Payer: Self-pay | Source: Home / Self Care | Attending: Otolaryngology

## 2018-10-07 ENCOUNTER — Encounter (HOSPITAL_BASED_OUTPATIENT_CLINIC_OR_DEPARTMENT_OTHER): Payer: Self-pay | Admitting: *Deleted

## 2018-10-07 DIAGNOSIS — J320 Chronic maxillary sinusitis: Secondary | ICD-10-CM | POA: Insufficient documentation

## 2018-10-07 DIAGNOSIS — Z7951 Long term (current) use of inhaled steroids: Secondary | ICD-10-CM | POA: Diagnosis not present

## 2018-10-07 DIAGNOSIS — J45909 Unspecified asthma, uncomplicated: Secondary | ICD-10-CM | POA: Diagnosis not present

## 2018-10-07 DIAGNOSIS — Z6838 Body mass index (BMI) 38.0-38.9, adult: Secondary | ICD-10-CM | POA: Diagnosis not present

## 2018-10-07 DIAGNOSIS — Z79899 Other long term (current) drug therapy: Secondary | ICD-10-CM | POA: Diagnosis not present

## 2018-10-07 DIAGNOSIS — K219 Gastro-esophageal reflux disease without esophagitis: Secondary | ICD-10-CM | POA: Diagnosis not present

## 2018-10-07 HISTORY — DX: Gastro-esophageal reflux disease without esophagitis: K21.9

## 2018-10-07 HISTORY — DX: Cough: R05

## 2018-10-07 HISTORY — DX: Chronic cough: R05.3

## 2018-10-07 HISTORY — PX: MAXILLARY ANTROSTOMY: SHX2003

## 2018-10-07 SURGERY — MAXILLARY ANTROSTOMY
Anesthesia: General | Laterality: Right

## 2018-10-07 MED ORDER — DEXAMETHASONE SODIUM PHOSPHATE 4 MG/ML IJ SOLN
INTRAMUSCULAR | Status: DC | PRN
  Administered 2018-10-07: 10 mg via INTRAVENOUS

## 2018-10-07 MED ORDER — FENTANYL CITRATE (PF) 100 MCG/2ML IJ SOLN
INTRAMUSCULAR | Status: AC
  Filled 2018-10-07: qty 2

## 2018-10-07 MED ORDER — BACITRACIN ZINC 500 UNIT/GM EX OINT
TOPICAL_OINTMENT | CUTANEOUS | Status: AC
  Filled 2018-10-07: qty 28.35

## 2018-10-07 MED ORDER — EPINEPHRINE 30 MG/30ML IJ SOLN
INTRAMUSCULAR | Status: AC
  Filled 2018-10-07: qty 1

## 2018-10-07 MED ORDER — MUPIROCIN 2 % EX OINT
TOPICAL_OINTMENT | CUTANEOUS | Status: AC
  Filled 2018-10-07: qty 22

## 2018-10-07 MED ORDER — PHENYLEPHRINE HCL 10 MG/ML IJ SOLN
INTRAMUSCULAR | Status: DC | PRN
  Administered 2018-10-07 (×2): 80 ug via INTRAVENOUS

## 2018-10-07 MED ORDER — MUPIROCIN 2 % EX OINT
TOPICAL_OINTMENT | CUTANEOUS | Status: DC | PRN
  Administered 2018-10-07: 1 via NASAL

## 2018-10-07 MED ORDER — ONDANSETRON HCL 4 MG/2ML IJ SOLN
INTRAMUSCULAR | Status: DC | PRN
  Administered 2018-10-07: 4 mg via INTRAVENOUS

## 2018-10-07 MED ORDER — LIDOCAINE-EPINEPHRINE 1 %-1:100000 IJ SOLN
INTRAMUSCULAR | Status: AC
  Filled 2018-10-07: qty 1

## 2018-10-07 MED ORDER — OXYMETAZOLINE HCL 0.05 % NA SOLN
NASAL | Status: AC
  Filled 2018-10-07: qty 15

## 2018-10-07 MED ORDER — ACETAMINOPHEN 500 MG PO TABS
1000.0000 mg | ORAL_TABLET | Freq: Once | ORAL | Status: AC
  Administered 2018-10-07: 1000 mg via ORAL

## 2018-10-07 MED ORDER — LIDOCAINE-EPINEPHRINE 1 %-1:100000 IJ SOLN
INTRAMUSCULAR | Status: DC | PRN
  Administered 2018-10-07: 5 mL

## 2018-10-07 MED ORDER — PROPOFOL 10 MG/ML IV BOLUS
INTRAVENOUS | Status: DC | PRN
  Administered 2018-10-07: 200 mg via INTRAVENOUS

## 2018-10-07 MED ORDER — LIDOCAINE-EPINEPHRINE 0.5 %-1:200000 IJ SOLN
INTRAMUSCULAR | Status: AC
  Filled 2018-10-07: qty 1

## 2018-10-07 MED ORDER — HYDROCODONE-ACETAMINOPHEN 5-325 MG PO TABS: 1.0000 | ORAL_TABLET | Freq: Four times a day (QID) | ORAL | 0 refills | Status: DC | PRN

## 2018-10-07 MED ORDER — ROCURONIUM BROMIDE 100 MG/10ML IV SOLN
INTRAVENOUS | Status: DC | PRN
  Administered 2018-10-07: 50 mg via INTRAVENOUS

## 2018-10-07 MED ORDER — ACETAMINOPHEN 500 MG PO TABS
ORAL_TABLET | ORAL | Status: AC
  Filled 2018-10-07: qty 2

## 2018-10-07 MED ORDER — PHENYLEPHRINE 40 MCG/ML (10ML) SYRINGE FOR IV PUSH (FOR BLOOD PRESSURE SUPPORT)
PREFILLED_SYRINGE | INTRAVENOUS | Status: AC
  Filled 2018-10-07: qty 10

## 2018-10-07 MED ORDER — MIDAZOLAM HCL 2 MG/2ML IJ SOLN
INTRAMUSCULAR | Status: AC
  Filled 2018-10-07: qty 2

## 2018-10-07 MED ORDER — FENTANYL CITRATE (PF) 100 MCG/2ML IJ SOLN
50.0000 ug | INTRAMUSCULAR | Status: DC | PRN
  Administered 2018-10-07 (×2): 100 ug via INTRAVENOUS

## 2018-10-07 MED ORDER — PROPOFOL 10 MG/ML IV BOLUS
INTRAVENOUS | Status: AC
  Filled 2018-10-07: qty 20

## 2018-10-07 MED ORDER — AMOXICILLIN-POT CLAVULANATE 875-125 MG PO TABS: 1.0000 | ORAL_TABLET | Freq: Two times a day (BID) | ORAL | 0 refills | Status: AC

## 2018-10-07 MED ORDER — CIPROFLOXACIN-DEXAMETHASONE 0.3-0.1 % OT SUSP
OTIC | Status: AC
  Filled 2018-10-07: qty 7.5

## 2018-10-07 MED ORDER — PROMETHAZINE HCL 25 MG/ML IJ SOLN: 6.2500 mg | INTRAMUSCULAR | Status: DC | PRN

## 2018-10-07 MED ORDER — SUGAMMADEX SODIUM 200 MG/2ML IV SOLN
INTRAVENOUS | Status: DC | PRN
  Administered 2018-10-07: 200 mg via INTRAVENOUS

## 2018-10-07 MED ORDER — MIDAZOLAM HCL 2 MG/2ML IJ SOLN
1.0000 mg | INTRAMUSCULAR | Status: DC | PRN
  Administered 2018-10-07: 2 mg via INTRAVENOUS

## 2018-10-07 MED ORDER — SCOPOLAMINE 1 MG/3DAYS TD PT72: 1.0000 | MEDICATED_PATCH | Freq: Once | TRANSDERMAL | Status: DC | PRN

## 2018-10-07 MED ORDER — LIDOCAINE HCL (CARDIAC) PF 100 MG/5ML IV SOSY
PREFILLED_SYRINGE | INTRAVENOUS | Status: DC | PRN
  Administered 2018-10-07: 100 mg via INTRAVENOUS

## 2018-10-07 MED ORDER — CEFAZOLIN SODIUM-DEXTROSE 2-3 GM-%(50ML) IV SOLR
INTRAVENOUS | Status: DC | PRN
  Administered 2018-10-07: 2 g via INTRAVENOUS

## 2018-10-07 MED ORDER — FENTANYL CITRATE (PF) 100 MCG/2ML IJ SOLN
25.0000 ug | INTRAMUSCULAR | Status: DC | PRN
  Administered 2018-10-07: 50 ug via INTRAVENOUS

## 2018-10-07 MED ORDER — OXYMETAZOLINE HCL 0.05 % NA SOLN
NASAL | Status: DC | PRN
  Administered 2018-10-07: 1 via TOPICAL

## 2018-10-07 MED ORDER — LACTATED RINGERS IV SOLN
INTRAVENOUS | Status: DC
  Administered 2018-10-07 (×2): via INTRAVENOUS

## 2018-10-07 SURGICAL SUPPLY — 63 items
ATTRACTOMAT 16X20 MAGNETIC DRP (DRAPES) ×3 IMPLANT
BLADE RAD40 ROTATE 4M 4 5PK (BLADE) IMPLANT
BLADE RAD40 ROTATE 4M 4MM 5PK (BLADE)
BLADE RAD60 ROTATE M4 4 5PK (BLADE) IMPLANT
BLADE RAD60 ROTATE M4 4MM 5PK (BLADE)
BLADE TRICUT ROTATE M4 4 5PK (BLADE) ×1 IMPLANT
BLADE TRICUT ROTATE M4 4MM 5PK (BLADE) ×1
BUR HS RAD FRONTAL 3 (BURR) IMPLANT
BUR HS RAD FRONTAL 3MM (BURR)
CANISTER SUC SOCK COL 7IN (MISCELLANEOUS) IMPLANT
CANISTER SUCT 1200ML W/VALVE (MISCELLANEOUS) ×6 IMPLANT
CLOSURE WOUND 1/2 X4 (GAUZE/BANDAGES/DRESSINGS) ×1
COAGULATOR SUCT 6 FR SWTCH (ELECTROSURGICAL)
COAGULATOR SUCT SWTCH 10FR 6 (ELECTROSURGICAL) IMPLANT
CORD BIPOLAR FORCEPS 12FT (ELECTRODE) ×2 IMPLANT
COVER WAND RF STERILE (DRAPES) IMPLANT
DECANTER SPIKE VIAL GLASS SM (MISCELLANEOUS) IMPLANT
DRESSING ADAPTIC 1/2  N-ADH (PACKING) IMPLANT
DRESSING NASAL KENNEDY 3.5X.9 (MISCELLANEOUS) IMPLANT
DRESSING NASL FOAM PST OP SINU (MISCELLANEOUS) IMPLANT
DRSG NASAL FOAM POST OP SINU (MISCELLANEOUS)
DRSG NASAL KENNEDY 3.5X.9 (MISCELLANEOUS)
DRSG NASAL KENNEDY LMNT 8CM (GAUZE/BANDAGES/DRESSINGS) IMPLANT
DRSG NASOPORE 8CM (GAUZE/BANDAGES/DRESSINGS) ×2 IMPLANT
ELECT COATED BLADE 2.86 ST (ELECTRODE) IMPLANT
ELECT REM PT RETURN 9FT ADLT (ELECTROSURGICAL) ×3
ELECTRODE REM PT RTRN 9FT ADLT (ELECTROSURGICAL) IMPLANT
GAUZE 4X4 16PLY RFD (DISPOSABLE) IMPLANT
GAUZE VASELINE FOILPK 1/2 X 72 (GAUZE/BANDAGES/DRESSINGS) IMPLANT
GLOVE BIO SURGEON STRL SZ7.5 (GLOVE) ×3 IMPLANT
GOWN STRL REUS W/ TWL LRG LVL3 (GOWN DISPOSABLE) ×2 IMPLANT
GOWN STRL REUS W/TWL LRG LVL3 (GOWN DISPOSABLE) ×4
IV NS 500ML (IV SOLUTION) ×4
IV NS 500ML BAXH (IV SOLUTION) ×1 IMPLANT
NDL PRECISIONGLIDE 27X1.5 (NEEDLE) ×1 IMPLANT
NDL SPNL 25GX3.5 QUINCKE BL (NEEDLE) ×1 IMPLANT
NEEDLE PRECISIONGLIDE 27X1.5 (NEEDLE) ×3 IMPLANT
NEEDLE SPNL 25GX3.5 QUINCKE BL (NEEDLE) ×3 IMPLANT
NS IRRIG 1000ML POUR BTL (IV SOLUTION) ×2 IMPLANT
PACK BASIN DAY SURGERY FS (CUSTOM PROCEDURE TRAY) ×3 IMPLANT
PACK ENT DAY SURGERY (CUSTOM PROCEDURE TRAY) ×3 IMPLANT
PATTIES SURGICAL .5 X3 (DISPOSABLE) ×3 IMPLANT
PENCIL BUTTON HOLSTER BLD 10FT (ELECTRODE) IMPLANT
SHEATH ENDOSCRUB 0 DEG (SHEATH) ×3 IMPLANT
SHEATH ENDOSCRUB 30 DEG (SHEATH) ×2 IMPLANT
SHEET SILICONE 2X3 0.03 REINF (Miscellaneous) IMPLANT
SLEEVE SCD COMPRESS KNEE MED (MISCELLANEOUS) ×3 IMPLANT
SOLUTION BUTLER CLEAR DIP (MISCELLANEOUS) ×3 IMPLANT
SPONGE GAUZE 2X2 8PLY STER LF (GAUZE/BANDAGES/DRESSINGS) ×1
SPONGE GAUZE 2X2 8PLY STRL LF (GAUZE/BANDAGES/DRESSINGS) ×2 IMPLANT
SPONGE SURGIFOAM ABS GEL 12-7 (HEMOSTASIS) IMPLANT
STRIP CLOSURE SKIN 1/2X4 (GAUZE/BANDAGES/DRESSINGS) ×2 IMPLANT
SUT CHROMIC 2 0 SH (SUTURE) IMPLANT
SUT CHROMIC 4 0 P 3 18 (SUTURE) IMPLANT
SUT ETHILON 3 0 PS 1 (SUTURE) IMPLANT
SYSTEM RELIEVA LUMA ILLUM (SINUPLASTY) IMPLANT
TOWEL GREEN STERILE FF (TOWEL DISPOSABLE) ×3 IMPLANT
TRAP SPECIMEN MUCOUS 40CC (MISCELLANEOUS) IMPLANT
TRAY DSU PREP LF (CUSTOM PROCEDURE TRAY) ×3 IMPLANT
TUBE CONNECTING 20'X1/4 (TUBING) ×1
TUBE CONNECTING 20X1/4 (TUBING) ×1 IMPLANT
TUBE SALEM SUMP 16 FR W/ARV (TUBING) ×2 IMPLANT
YANKAUER SUCT BULB TIP NO VENT (SUCTIONS) ×3 IMPLANT

## 2018-10-07 NOTE — Anesthesia Procedure Notes (Signed)
Procedure Name: Intubation Performed by: Willa Frater, CRNA Pre-anesthesia Checklist: Patient identified, Emergency Drugs available, Suction available, Patient being monitored and Timeout performed Patient Re-evaluated:Patient Re-evaluated prior to induction Oxygen Delivery Method: Circle system utilized Preoxygenation: Pre-oxygenation with 100% oxygen Induction Type: IV induction Ventilation: Mask ventilation without difficulty Laryngoscope Size: Mac and 4 Grade View: Grade I Tube type: Oral Tube size: 7.0 mm Number of attempts: 1 Airway Equipment and Method: Stylet Placement Confirmation: ETT inserted through vocal cords under direct vision,  positive ETCO2,  CO2 detector and breath sounds checked- equal and bilateral Secured at: 23 cm Tube secured with: Tape Dental Injury: Teeth and Oropharynx as per pre-operative assessment

## 2018-10-07 NOTE — Anesthesia Preprocedure Evaluation (Addendum)
Anesthesia Evaluation  Patient identified by MRN, date of birth, ID band Patient awake    Reviewed: Allergy & Precautions, NPO status , Patient's Chart, lab work & pertinent test results  Airway Mallampati: I  TM Distance: >3 FB Neck ROM: Full    Dental  (+) Dental Advisory Given, Missing,    Pulmonary asthma ,    Pulmonary exam normal breath sounds clear to auscultation       Cardiovascular negative cardio ROS Normal cardiovascular exam Rhythm:Regular Rate:Normal     Neuro/Psych negative neurological ROS     GI/Hepatic Neg liver ROS, GERD  Medicated,  Endo/Other  Morbid obesity  Renal/GU negative Renal ROS     Musculoskeletal negative musculoskeletal ROS (+)   Abdominal   Peds  Hematology negative hematology ROS (+)   Anesthesia Other Findings Day of surgery medications reviewed with the patient.  Reproductive/Obstetrics                            Anesthesia Physical Anesthesia Plan  ASA: III  Anesthesia Plan: General   Post-op Pain Management:    Induction: Intravenous  PONV Risk Score and Plan: 3 and Midazolam, Dexamethasone and Ondansetron  Airway Management Planned: LMA  Additional Equipment:   Intra-op Plan:   Post-operative Plan: Extubation in OR  Informed Consent: I have reviewed the patients History and Physical, chart, labs and discussed the procedure including the risks, benefits and alternatives for the proposed anesthesia with the patient or authorized representative who has indicated his/her understanding and acceptance.   Dental advisory given  Plan Discussed with: CRNA  Anesthesia Plan Comments:         Anesthesia Quick Evaluation

## 2018-10-07 NOTE — Progress Notes (Signed)
Pt, husband and daughter given discharge instructions. Pt said she had asked Dr.Bates for a work note. I explained that I did not see one and that she could use her discharge instructions until she can get one from Dr. Redmond Baseman. Pt was insistent on me calling to see if Dr. Redmond Baseman would fax a note here for her. Her husband wanted it faxed here as well. I called Dr. Redmond Baseman office, spoke with Lattie Haw his assistant, explained pt was requesting a note for work and wanted it faxed here. Lattie Haw said they could not do note now because they were seeing patients, but for the pt to call tomorrow and the office would fax it to her employer. I told pt and family the above. Pt stated " I just want to go home!"

## 2018-10-07 NOTE — Transfer of Care (Signed)
Immediate Anesthesia Transfer of Care Note  Patient: Joyce Welch  Procedure(s) Performed: RIGHT MAXILLARY ANTROSTOMY WITH TISSUE REMOVAL. (Right )  Patient Location: PACU  Anesthesia Type:General  Level of Consciousness: awake, alert  and oriented  Airway & Oxygen Therapy: Patient Spontanous Breathing and Patient connected to face mask oxygen  Post-op Assessment: Report given to RN and Post -op Vital signs reviewed and stable  Post vital signs: Reviewed and stable  Last Vitals:  Vitals Value Taken Time  BP    Temp    Pulse 97 10/07/2018  1:07 PM  Resp 17 10/07/2018  1:07 PM  SpO2 100 % 10/07/2018  1:07 PM  Vitals shown include unvalidated device data.  Last Pain:  Vitals:   10/07/18 1041  TempSrc: Oral  PainSc: 4          Complications: No apparent anesthesia complications

## 2018-10-07 NOTE — Anesthesia Postprocedure Evaluation (Signed)
Anesthesia Post Note  Patient: Joyce Welch  Procedure(s) Performed: RIGHT MAXILLARY ANTROSTOMY WITH TISSUE REMOVAL. (Right )     Patient location during evaluation: PACU Anesthesia Type: General Level of consciousness: awake and alert, awake and oriented Pain management: pain level controlled Vital Signs Assessment: post-procedure vital signs reviewed and stable Respiratory status: spontaneous breathing, nonlabored ventilation and respiratory function stable Cardiovascular status: blood pressure returned to baseline and stable Postop Assessment: no apparent nausea or vomiting Anesthetic complications: no    Last Vitals:  Vitals:   10/07/18 1400 10/07/18 1415  BP: 132/78 134/79  Pulse: 70 64  Resp: 13 17  Temp:    SpO2: 100% 100%    Last Pain:  Vitals:   10/07/18 1415  TempSrc:   PainSc: 0-No pain                 Catalina Gravel

## 2018-10-07 NOTE — Brief Op Note (Signed)
10/07/2018  12:52 PM  PATIENT:  Joyce Welch  48 y.o. female  PRE-OPERATIVE DIAGNOSIS:  RECURRENT SINUS INFECTION  POST-OPERATIVE DIAGNOSIS:  same  PROCEDURE:  Procedure(s): RIGHT MAXILLARY ANTROSTOMY WITH TISSUE REMOVAL. (Right)  SURGEON:  Surgeon(s) and Role:    * Melida Quitter, MD - Primary  PHYSICIAN ASSISTANT:   ASSISTANTS: none   ANESTHESIA:   general  EBL:  20 mL   BLOOD ADMINISTERED:none  DRAINS: none   LOCAL MEDICATIONS USED:  LIDOCAINE   SPECIMEN:  Source of Specimen:  Sinus contents  DISPOSITION OF SPECIMEN:  PATHOLOGY  COUNTS:  YES  TOURNIQUET:  * No tourniquets in log *  DICTATION: .Other Dictation: Dictation Number [id number]  PLAN OF CARE: Discharge to home after PACU  PATIENT DISPOSITION:  PACU - hemodynamically stable.   Delay start of Pharmacological VTE agent (>24hrs) due to surgical blood loss or risk of bleeding: no

## 2018-10-07 NOTE — Op Note (Signed)
NAME: Joyce Welch, Joyce Welch MEDICAL RECORD [id number] ACCOUNT [id number] DATE OF BIRTH:10-17-1970 FACILITY: [facility] LOCATION: [facility] PHYSICIAN:Rylen Swindler Guido Sander, MD  OPERATIVE REPORT  DATE OF PROCEDURE:  10/07/2018  PREOPERATIVE DIAGNOSIS:  Right chronic maxillary sinusitis.  POSTOPERATIVE DIAGNOSIS:  Right chronic maxillary sinusitis.  PROCEDURE:  Right maxillary antrostomy with tissue removal.  SURGEON:  Melida Quitter, MD  ANESTHESIA:  General endotracheal anesthesia.  COMPLICATIONS:  None.  INDICATIONS:  The patient is a 48 year old female with a long history of recurring sinus infection symptoms who was found to have evidence of what looked like a fungal ball on a sinus CT scan in the right maxillary sinus.  She presents to the operating  room for surgical management.  FINDINGS:  The right maxillary sinus contained thick inspissated debris consistent with fungal ball.  The debris was fully removed from the sinus with irrigation and suction and the maxillary sinus left widely patent.  DESCRIPTION OF PROCEDURE:  The patient was identified in the holding room, informed consent having been obtained including discussion of risks, benefits and alternatives.  The patient was brought to the operative suite and put the operative table in  supine position.  Anesthesia was induced and the patient was intubated by the anesthesia team without difficulty.  The patient was given intravenous antibiotics during the case.  The eyes were lubricated and the face was prepped and draped in sterile  fashion.  The nasal passages were packed with Afrin pledgets for several minutes, which were then removed.  The right nasal passage was inspected with a degree telescope and the lateral nasal wall injected with 1% lidocaine with 1:100,000 epinephrine.   The middle turbinate was medialized and the uncinate process elevated off the lateral wall, divided, and removed with microdebrider.  An angled telescope was then  used to evaluate the maxillary opening area which was where the natural ostium was found  with suction and widened in an inferior and posterior direction using the curved microdebrider.  Contents within the sinus were then removed using the microdebrider as well as irrigation and suction.  After this was completed, the middle meatus region  was packed with half of Nasopore pack coated in Bactroban ointment.  Nasal passages and throat were suctioned, and the patient returned to anesthesia for wakeup was extubated in the recovery room in stable condition.  TN/NUANCE  D:10/07/2018 T:10/07/2018 JOB:[id number]

## 2018-10-07 NOTE — Discharge Instructions (Signed)
°  Post Anesthesia Home Care Instructions  Activity: Get plenty of rest for the remainder of the day. A responsible individual must stay with you for 24 hours following the procedure.  For the next 24 hours, DO NOT: -Drive a car -Operate machinery -Drink alcoholic beverages -Take any medication unless instructed by your physician -Make any legal decisions or sign important papers.  Meals: Start with liquid foods such as gelatin or soup. Progress to regular foods as tolerated. Avoid greasy, spicy, heavy foods. If nausea and/or vomiting occur, drink only clear liquids until the nausea and/or vomiting subsides. Call your physician if vomiting continues.  Special Instructions/Symptoms: Your throat may feel dry or sore from the anesthesia or the breathing tube placed in your throat during surgery. If this causes discomfort, gargle with warm salt water. The discomfort should disappear within 24 hours.  If you had a scopolamine patch placed behind your ear for the management of post- operative nausea and/or vomiting:  1. The medication in the patch is effective for 72 hours, after which it should be removed.  Wrap patch in a tissue and discard in the trash. Wash hands thoroughly with soap and water. 2. You may remove the patch earlier than 72 hours if you experience unpleasant side effects which may include dry mouth, dizziness or visual disturbances. 3. Avoid touching the patch. Wash your hands with soap and water after contact with the patch.    Post Anesthesia Home Care Instructions  Activity: Get plenty of rest for the remainder of the day. A responsible individual must stay with you for 24 hours following the procedure.  For the next 24 hours, DO NOT: -Drive a car -Operate machinery -Drink alcoholic beverages -Take any medication unless instructed by your physician -Make any legal decisions or sign important papers.  Meals: Start with liquid foods such as gelatin or soup. Progress to  regular foods as tolerated. Avoid greasy, spicy, heavy foods. If nausea and/or vomiting occur, drink only clear liquids until the nausea and/or vomiting subsides. Call your physician if vomiting continues.  Special Instructions/Symptoms: Your throat may feel dry or sore from the anesthesia or the breathing tube placed in your throat during surgery. If this causes discomfort, gargle with warm salt water. The discomfort should disappear within 24 hours.  If you had a scopolamine patch placed behind your ear for the management of post- operative nausea and/or vomiting:  1. The medication in the patch is effective for 72 hours, after which it should be removed.  Wrap patch in a tissue and discard in the trash. Wash hands thoroughly with soap and water. 2. You may remove the patch earlier than 72 hours if you experience unpleasant side effects which may include dry mouth, dizziness or visual disturbances. 3. Avoid touching the patch. Wash your hands with soap and water after contact with the patch.    

## 2018-10-07 NOTE — H&P (Signed)
Joyce Welch is an 48 y.o. female.   Chief Complaint: Chronic sinusitis HPI: 48 year old female with recurring sinusitis and evidence of chronic right maxillary sinusitis on CT.  She presents for surgical management.  Past Medical History:  Diagnosis Date  . Asthma   . Chronic cough   . Fibroids   . GERD (gastroesophageal reflux disease)   . OAB (overactive bladder)     Past Surgical History:  Procedure Laterality Date  . ABDOMINAL HYSTERECTOMY  02/05/2006   partial  . CESAREAN SECTION  1994, 2002, 2005,    x 3    Family History  Problem Relation Age of Onset  . Breast cancer Maternal Aunt    Social History:  reports that she has never smoked. She has never used smokeless tobacco. She reports previous alcohol use. She reports that she does not use drugs.  Allergies: No Known Allergies  Medications Prior to Admission  Medication Sig Dispense Refill  . albuterol (PROVENTIL HFA;VENTOLIN HFA) 108 (90 BASE) MCG/ACT inhaler Inhale 1 puff into the lungs every 6 (six) hours as needed for wheezing or shortness of breath.     Marland Kitchen azelastine (ASTELIN) 0.1 % nasal spray Place 1 spray into both nostrils 2 (two) times daily. Use in each nostril as directed (Patient taking differently: Place 1 spray into both nostrils See admin instructions. Instill 1 spray into each nostril two times a day as directed) 30 mL 12  . fluticasone (FLONASE) 50 MCG/ACT nasal spray Place 2 sprays into both nostrils daily.     Marland Kitchen ibuprofen (ADVIL,MOTRIN) 600 MG tablet Take 1 tablet (600 mg total) by mouth every 6 (six) hours as needed for fever or moderate pain. 10 tablet 0  . loratadine (CLARITIN) 10 MG tablet Take 10 mg by mouth daily.    . mometasone-formoterol (DULERA) 100-5 MCG/ACT AERO Inhale 2 puffs into the lungs 2 (two) times daily. 1 Inhaler 0  . omeprazole (PRILOSEC) 40 MG capsule Take 1 capsule (40 mg total) by mouth daily. 30 capsule 1  . albuterol (PROVENTIL) (2.5 MG/3ML) 0.083% nebulizer solution Take 2.5  mg by nebulization every 6 (six) hours as needed for wheezing or shortness of breath.      No results found for this or any previous visit (from the past 48 hour(s)). No results found.  Review of Systems  Respiratory: Positive for cough.   All other systems reviewed and are negative.   Blood pressure (!) 143/83, pulse 79, temperature 97.9 F (36.6 C), temperature source Oral, height 5\' 6"  (1.676 m), weight 108.6 kg, SpO2 100 %. Physical Exam  Constitutional: She is oriented to person, place, and time. She appears well-developed and well-nourished. No distress.  HENT:  Head: Normocephalic and atraumatic.  Right Ear: External ear normal.  Left Ear: External ear normal.  Nose: Nose normal.  Mouth/Throat: Oropharynx is clear and moist.  Eyes: Pupils are equal, round, and reactive to light. Conjunctivae and EOM are normal.  Neck: Normal range of motion. Neck supple.  Cardiovascular: Normal rate.  Respiratory: Effort normal.  Neurological: She is alert and oriented to person, place, and time. No cranial nerve deficit.  Skin: Skin is warm and dry.  Psychiatric: She has a normal mood and affect. Her behavior is normal. Judgment and thought content normal.     Assessment/Plan Right chronic maxillary sinusitis To OR for right maxillary antrostomy.  Melida Quitter, MD 10/07/2018, 10:49 AM

## 2018-10-09 ENCOUNTER — Encounter (HOSPITAL_BASED_OUTPATIENT_CLINIC_OR_DEPARTMENT_OTHER): Payer: Self-pay | Admitting: Otolaryngology

## 2018-10-17 ENCOUNTER — Encounter: Payer: Self-pay | Admitting: Neurology

## 2018-10-17 ENCOUNTER — Ambulatory Visit: Payer: Medicaid Other | Admitting: Neurology

## 2018-10-17 VITALS — BP 129/74 | HR 96 | Ht 66.0 in | Wt 240.0 lb

## 2018-10-17 DIAGNOSIS — R531 Weakness: Secondary | ICD-10-CM

## 2018-10-17 DIAGNOSIS — R202 Paresthesia of skin: Secondary | ICD-10-CM

## 2018-10-17 NOTE — Progress Notes (Signed)
PATIENT: Joyce Welch DOB: April 17, 1971  Chief Complaint  Patient presents with  . Stroke-like symptoms    She is here with her daughter, Joyce Welch. Reports being seen in ED on 08/06/18 with the following symptoms: slurred speech, left facial numbness/tingling/droopy and left-sided weakness.  States her workup was negative for CVA. Says this was an isolated event.   Marland Kitchen PCP    [name], MD     HISTORICAL  Joyce Welch is a 48 year old female, seen in request by her primary care physician Dr. [name] for evaluation of strokelike symptoms, initial evaluation was on October 17, 2018.  She is accompanied by her daughter Joyce Welch at today's visit.  I have reviewed and summarized the emergency room visit on August 06, 2018, patient stated she has no clear recollection of the event, by reviewing the ER record, while driving, she had a sudden onset left facial arm numbness and weakness, slurred speech, had extensive evaluation.  I personally reviewed MRI of the brain on August 06, 2018, showed no significant abnormality, evidence of right maxillary sinusitis  Laboratory evaluations showed low potassium 3.4, normal creatinine 1.0, ionized calcium 1.08 mildly decreased, negative troponin, mild anemia hemoglobin of 11.8  Her left side symptoms lasted 1 to 2 hours, she did complains of right-sided headaches during and after left-sided symptoms,, she denies focal symptoms now, complains of chest pain, has primary care visit pending, she had occasionally headache in the past, getting worse since her right sinus surgery,  REVIEW OF SYSTEMS: Full 14 system review of systems performed and notable only for allergies, urination problems All other review of systems were negative.  ALLERGIES: No Known Allergies  HOME MEDICATIONS: Current Outpatient Medications  Medication Sig Dispense Refill  . albuterol (PROVENTIL HFA;VENTOLIN HFA) 108 (90 BASE) MCG/ACT inhaler Inhale 1 puff into the lungs  every 6 (six) hours as needed for wheezing or shortness of breath.     Marland Kitchen albuterol (PROVENTIL) (2.5 MG/3ML) 0.083% nebulizer solution Take 2.5 mg by nebulization every 6 (six) hours as needed for wheezing or shortness of breath.    Marland Kitchen azelastine (ASTELIN) 0.1 % nasal spray Place 1 spray into both nostrils 2 (two) times daily. Use in each nostril as directed (Patient taking differently: Place 1 spray into both nostrils See admin instructions. Instill 1 spray into each nostril two times a day as directed) 30 mL 12  . fluticasone (FLONASE) 50 MCG/ACT nasal spray Place 2 sprays into both nostrils daily.     Marland Kitchen HYDROcodone-acetaminophen (NORCO/VICODIN) 5-325 MG tablet Take 1-2 tablets by mouth every 6 (six) hours as needed for moderate pain. 20 tablet 0  . ibuprofen (ADVIL,MOTRIN) 600 MG tablet Take 1 tablet (600 mg total) by mouth every 6 (six) hours as needed for fever or moderate pain. 10 tablet 0  . loratadine (CLARITIN) 10 MG tablet Take 10 mg by mouth daily.    . mometasone-formoterol (DULERA) 100-5 MCG/ACT AERO Inhale 2 puffs into the lungs 2 (two) times daily. 1 Inhaler 0  . omeprazole (PRILOSEC) 40 MG capsule Take 1 capsule (40 mg total) by mouth daily. 30 capsule 1   No current facility-administered medications for this visit.     PAST MEDICAL HISTORY: Past Medical History:  Diagnosis Date  . Asthma   . Chronic cough   . Fibroids   . GERD (gastroesophageal reflux disease)   . OAB (overactive bladder)     PAST SURGICAL HISTORY: Past Surgical History:  Procedure Laterality Date  . ABDOMINAL  HYSTERECTOMY  02/05/2006   partial  . [place], 2002, 2005,    x 3  . MAXILLARY ANTROSTOMY Right 10/07/2018   Procedure: RIGHT MAXILLARY ANTROSTOMY WITH TISSUE REMOVAL.;  Surgeon: Melida Quitter, MD;  Location: [facility];  Service: ENT;  Laterality: Right;    FAMILY HISTORY: Family History  Problem Relation Age of Onset  . Hypertension Mother   . Other Father         unsure of medical history  . Breast cancer Maternal Aunt     SOCIAL HISTORY: Social History   Socioeconomic History  . Marital status: Single    Spouse name: Not on file  . Number of children: 3  . Years of education: 28  . Highest education level: High school graduate  Occupational History  . Occupation: Mudlogger   Social Needs  . Financial resource strain: Not on file  . Food insecurity:    Worry: Not on file    Inability: Not on file  . Transportation needs:    Medical: Not on file    Non-medical: Not on file  Tobacco Use  . Smoking status: Never Smoker  . Smokeless tobacco: Never Used  Substance and Sexual Activity  . Alcohol use: Not Currently  . Drug use: No  . Sexual activity: Yes    Partners: Male    Birth control/protection: Surgical  Lifestyle  . Physical activity:    Days per week: Not on file    Minutes per session: Not on file  . Stress: Not on file  Relationships  . Social connections:    Talks on phone: Not on file    Gets together: Not on file    Attends religious service: Not on file    Active member of club or organization: Not on file    Attends meetings of clubs or organizations: Not on file    Relationship status: Not on file  . Intimate partner violence:    Fear of current or ex partner: Not on file    Emotionally abused: Not on file    Physically abused: Not on file    Forced sexual activity: Not on file  Other Topics Concern  . Not on file  Social History Narrative   Lives at home with her daughter.   Right-handed.   Caffeine use:  3 cups daily.     PHYSICAL EXAM   Vitals:   10/17/18 1349  BP: 129/74  Pulse: 96  Weight: 240 lb (108.9 kg)  Height: 5\' 6"  (1.676 m)    Not recorded      Body mass index is 38.74 kg/m.  PHYSICAL EXAMNIATION:  Gen: NAD, conversant, well nourised, obese, well groomed                     Cardiovascular: Regular rate rhythm, no peripheral edema, warm, nontender. Eyes:  Conjunctivae clear without exudates or hemorrhage Neck: Supple, no carotid bruits. Pulmonary: Clear to auscultation bilaterally   NEUROLOGICAL EXAM:  MENTAL STATUS: Speech:    Speech is normal; fluent and spontaneous with normal comprehension.  Cognition:     Orientation to time, place and person     Normal recent and remote memory     Normal Attention span and concentration     Normal Language, naming, repeating,spontaneous speech     Fund of knowledge   CRANIAL NERVES: CN II: Visual fields are full to confrontation.  Pupils are round equal and briskly reactive to light.  CN III, IV, VI: extraocular movement are normal. No ptosis. CN V: Facial sensation is intact to pinprick in all 3 divisions bilaterally. Corneal responses are intact.  CN VII: Face is symmetric with normal eye closure and smile. CN VIII: Hearing is normal to rubbing fingers CN IX, X: Palate elevates symmetrically. Phonation is normal. CN XI: Head turning and shoulder shrug are intact CN XII: Tongue is midline with normal movements and no atrophy.  MOTOR: There is no pronator drift of out-stretched arms. Muscle bulk and tone are normal. Muscle strength is normal.  REFLEXES: Reflexes are 2+ and symmetric at the biceps, triceps, knees, and ankles. Plantar responses are flexor.  SENSORY: Intact to light touch, pinprick, positional sensation and vibratory sensation are intact in fingers and toes.  COORDINATION: Rapid alternating movements and fine finger movements are intact. There is no dysmetria on finger-to-nose and heel-knee-shin.    GAIT/STANCE: Posture is normal. Gait is steady with normal steps, base, arm swing, and turning. Heel and toe walking are normal. Tandem gait is normal.  Romberg is absent.   DIAGNOSTIC DATA (LABS, IMAGING, TESTING) - I reviewed patient records, labs, notes, testing and imaging myself where available.   ASSESSMENT AND PLAN  Joyce Welch is a 48 y.o. female   Left side  paresthesia  MRI of the brain showed no evidence of stroke  She did complains of lateralized headache during the spells, differentiation diagnoses also include complicated migraine headaches  NSAIDs as needed  Only return to clinic for new issues   Marcial Pacas, M.D. Ph.D.  [facility] [address] [place], [place] [address] Ph: [phone] Fax: [phone]  CC: [name], MD

## 2018-11-14 ENCOUNTER — Other Ambulatory Visit: Payer: Self-pay | Admitting: Nurse Practitioner

## 2018-11-24 ENCOUNTER — Emergency Department (HOSPITAL_COMMUNITY)
Admission: EM | Admit: 2018-11-24 | Discharge: 2018-11-24 | Disposition: A | Discharge status: Home / Self Care | Payer: Medicaid Other | Attending: Emergency Medicine | Admitting: Emergency Medicine

## 2018-11-24 ENCOUNTER — Encounter (HOSPITAL_COMMUNITY): Payer: Self-pay | Admitting: Emergency Medicine

## 2018-11-24 ENCOUNTER — Other Ambulatory Visit: Payer: Self-pay

## 2018-11-24 DIAGNOSIS — Y929 Unspecified place or not applicable: Secondary | ICD-10-CM | POA: Insufficient documentation

## 2018-11-24 DIAGNOSIS — S39012A Strain of muscle, fascia and tendon of lower back, initial encounter: Secondary | ICD-10-CM | POA: Diagnosis not present

## 2018-11-24 DIAGNOSIS — Y939 Activity, unspecified: Secondary | ICD-10-CM | POA: Insufficient documentation

## 2018-11-24 DIAGNOSIS — Y999 Unspecified external cause status: Secondary | ICD-10-CM | POA: Insufficient documentation

## 2018-11-24 DIAGNOSIS — X58XXXA Exposure to other specified factors, initial encounter: Secondary | ICD-10-CM | POA: Insufficient documentation

## 2018-11-24 DIAGNOSIS — J45909 Unspecified asthma, uncomplicated: Secondary | ICD-10-CM | POA: Diagnosis not present

## 2018-11-24 DIAGNOSIS — S3992XA Unspecified injury of lower back, initial encounter: Secondary | ICD-10-CM | POA: Diagnosis present

## 2018-11-24 DIAGNOSIS — Z79899 Other long term (current) drug therapy: Secondary | ICD-10-CM | POA: Insufficient documentation

## 2018-11-24 MED ORDER — PREDNISONE 10 MG (21) PO TBPK: ORAL_TABLET | ORAL | 0 refills | Status: DC

## 2018-11-24 MED ORDER — KETOROLAC TROMETHAMINE 30 MG/ML IJ SOLN
30.0000 mg | Freq: Once | INTRAMUSCULAR | Status: AC
  Administered 2018-11-24: 30 mg via INTRAMUSCULAR
  Filled 2018-11-24: qty 1

## 2018-11-24 MED ORDER — DEXAMETHASONE SODIUM PHOSPHATE 10 MG/ML IJ SOLN
10.0000 mg | Freq: Once | INTRAMUSCULAR | Status: AC
  Administered 2018-11-24: 10 mg via INTRAMUSCULAR
  Filled 2018-11-24: qty 1

## 2018-11-24 MED ORDER — IBUPROFEN 600 MG PO TABS: 600.0000 mg | ORAL_TABLET | Freq: Four times a day (QID) | ORAL | 0 refills | Status: DC | PRN

## 2018-11-24 NOTE — ED Provider Notes (Signed)
[facility] EMERGENCY DEPARTMENT Provider Note   CSN: [id number] Arrival date & time: 11/24/18  1904    History   Chief Complaint Chief Complaint  Patient presents with  . Back Pain    HPI Joyce Welch is a 48 y.o. female.     Pt presents to the ED today with low back pain.  The pt does not know what she did to hurt her back.  The pt said she has a little pain that radiates to both legs.  She denies any urinary or bowel incontinence.  She said it hurts to move and to twist.       Past Medical History:  Diagnosis Date  . Asthma   . Chronic cough   . Fibroids   . GERD (gastroesophageal reflux disease)   . OAB (overactive bladder)     Patient Active Problem List   Diagnosis Date Noted  . Paresthesia 10/17/2018  . Weakness 10/17/2018  . Cough 06/20/2018  . Post-nasal drip 06/20/2018  . Globus sensation 06/20/2018  . Asthma exacerbation 10/31/2017  . Leukocytosis 10/31/2017  . Strain of quadriceps tendon 02/14/2014  . Symptomatic menopausal or female climacteric states 07/31/2013    Past Surgical History:  Procedure Laterality Date  . ABDOMINAL HYSTERECTOMY  02/05/2006   partial  . [place], 2002, 2005,    x 3  . MAXILLARY ANTROSTOMY Right 10/07/2018   Procedure: RIGHT MAXILLARY ANTROSTOMY WITH TISSUE REMOVAL.;  Surgeon: Melida Quitter, MD;  Location: [facility];  Service: ENT;  Laterality: Right;     OB History    Gravida  3   Para  3   Term  1   Preterm      AB      Living  3     SAB      TAB      Ectopic      Multiple      Live Births  3            Home Medications    Prior to Admission medications   Medication Sig Start Date End Date Taking? Authorizing Provider  albuterol (PROVENTIL HFA;VENTOLIN HFA) 108 (90 BASE) MCG/ACT inhaler Inhale 1 puff into the lungs every 6 (six) hours as needed for wheezing or shortness of breath.     [provider]  albuterol (PROVENTIL) (2.5  MG/3ML) 0.083% nebulizer solution Take 2.5 mg by nebulization every 6 (six) hours as needed for wheezing or shortness of breath.    [provider]  azelastine (ASTELIN) 0.1 % nasal spray Place 1 spray into both nostrils 2 (two) times daily. Use in each nostril as directed Patient taking differently: Place 1 spray into both nostrils See admin instructions. Instill 1 spray into each nostril two times a day as directed 06/04/18   [name], MD  DULERA 100-5 MCG/ACT AERO INHALE 2 PUFFS INTO THE LUNGS TWICE DAILY 11/15/18   Fenton Foy, NP  fluticasone (FLONASE) 50 MCG/ACT nasal spray Place 2 sprays into both nostrils daily.     [provider]  HYDROcodone-acetaminophen (NORCO/VICODIN) 5-325 MG tablet Take 1-2 tablets by mouth every 6 (six) hours as needed for moderate pain. 10/07/18 10/07/19  Melida Quitter, MD  ibuprofen (ADVIL,MOTRIN) 600 MG tablet Take 1 tablet (600 mg total) by mouth every 6 (six) hours as needed. 11/24/18   Isla Pence, MD  loratadine (CLARITIN) 10 MG tablet Take 10 mg by mouth daily.    [provider]  omeprazole (PRILOSEC) 40 MG capsule Take 1 capsule (40 mg total) by mouth daily. 06/04/18   [name], MD  predniSONE (STERAPRED UNI-PAK 21 TAB) 10 MG (21) TBPK tablet Take 6 tabs for 2 days, then 5 for 2 days, then 4 for 2 days, then 3 for 2 days, 2 for 2 days, then 1 for 2 days 11/24/18   Isla Pence, MD    Family History Family History  Problem Relation Age of Onset  . Hypertension Mother   . Other Father        unsure of medical history  . Breast cancer Maternal Aunt     Social History Social History   Tobacco Use  . Smoking status: Never Smoker  . Smokeless tobacco: Never Used  Substance Use Topics  . Alcohol use: Not Currently  . Drug use: No     Allergies   Patient has no known allergies.   Review of Systems Review of Systems  Musculoskeletal: Positive for back pain.  All other systems reviewed and are  negative.    Physical Exam Updated Vital Signs BP (!) 152/82 (BP Location: Left Arm)   Pulse 97   Temp 98.4 F (36.9 C) (Oral)   Resp 18   SpO2 98%   Physical Exam Vitals signs and nursing note reviewed.  Constitutional:      Appearance: Normal appearance.  HENT:     Head: Normocephalic and atraumatic.     Right Ear: External ear normal.     Left Ear: External ear normal.     Nose: Nose normal.     Mouth/Throat:     Mouth: Mucous membranes are moist.  Eyes:     Extraocular Movements: Extraocular movements intact.     Pupils: Pupils are equal, round, and reactive to light.  Neck:     Musculoskeletal: Normal range of motion and neck supple.  Cardiovascular:     Rate and Rhythm: Normal rate and regular rhythm.     Pulses: Normal pulses.     Heart sounds: Normal heart sounds.  Pulmonary:     Effort: Pulmonary effort is normal.     Breath sounds: Normal breath sounds.  Abdominal:     General: Abdomen is flat.  Musculoskeletal:     Lumbar back: She exhibits tenderness.  Skin:    General: Skin is warm.     Capillary Refill: Capillary refill takes less than 2 seconds.  Neurological:     General: No focal deficit present.     Mental Status: She is alert and oriented to person, place, and time.  Psychiatric:        Mood and Affect: Mood normal.        Behavior: Behavior normal.      ED Treatments / Results  Labs (all labs ordered are listed, but only abnormal results are displayed) Labs Reviewed - No data to display  EKG None  Radiology No results found.  Procedures Procedures (including critical care time)  Medications Ordered in ED Medications  dexamethasone (DECADRON) injection 10 mg (has no administration in time range)  ketorolac (TORADOL) 30 MG/ML injection 30 mg (has no administration in time range)     Initial Impression / Assessment and Plan / ED Course  I have reviewed the triage vital signs and the nursing notes.  Pertinent labs & imaging  results that were available during my care of the patient were reviewed by me and considered in my medical decision making (see chart for  details).       Pt will be given toradol and decadron in ED.  She will be d/c home with prednisone and ibuprofen and low back exercises.  She knows to return if worse.  F/u with pcp.  Final Clinical Impressions(s) / ED Diagnoses   Final diagnoses:  Strain of lumbar region, initial encounter    ED Discharge Orders         Ordered    predniSONE (STERAPRED UNI-PAK 21 TAB) 10 MG (21) TBPK tablet     11/24/18 2129    ibuprofen (ADVIL,MOTRIN) 600 MG tablet  Every 6 hours PRN     11/24/18 2129           Isla Pence, MD 11/24/18 2148

## 2018-11-24 NOTE — ED Notes (Signed)
Pt declined wheelchair.

## 2018-11-24 NOTE — ED Triage Notes (Signed)
C/o lower back pain x 1 week.  No known injury.

## 2019-01-27 IMAGING — CT CT CHEST W/ CM
2 of 4 series · 15 of 36 positions shown, 18 images · IV contrast (ISOVUE 300)
Comparison: Chest CT 11/02/2011

CLINICAL DATA: Persistent cough and congestion since April 2018

EXAM:
CT CHEST WITH CONTRAST
TECHNIQUE: Multidetector CT imaging of the chest was performed during
intravenous contrast administration.
CONTRAST:  80mL [id number] IOPAMIDOL ([id number]) INJECTION 61%

[Series 2: thorax · axial · 0.89mm/px · z∈[+946,+1188]mm · 12 of 143 slices shown, 15 images]
[im 11/143  mediastinal]
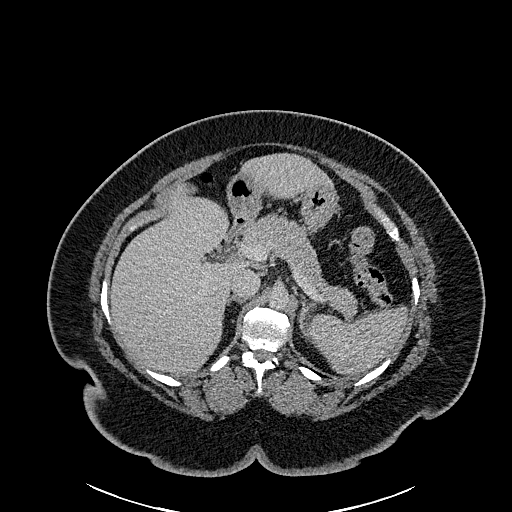
[im 11/143  lung]
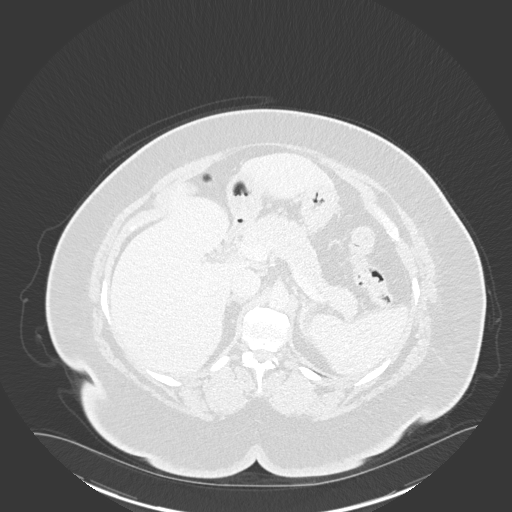
[im 22/143  lung]
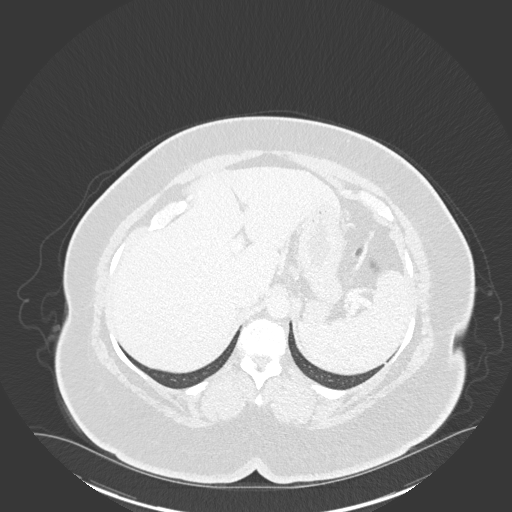
[im 33/143  lung]
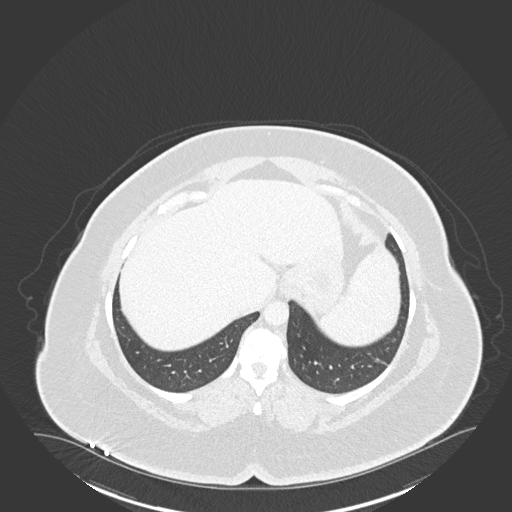
[im 44/143  lung]
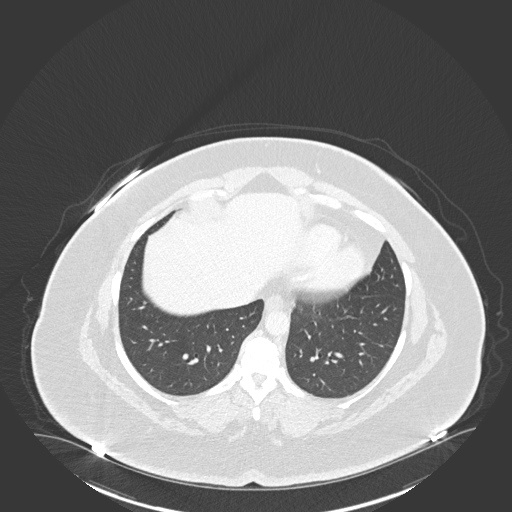
[im 55/143  mediastinal]
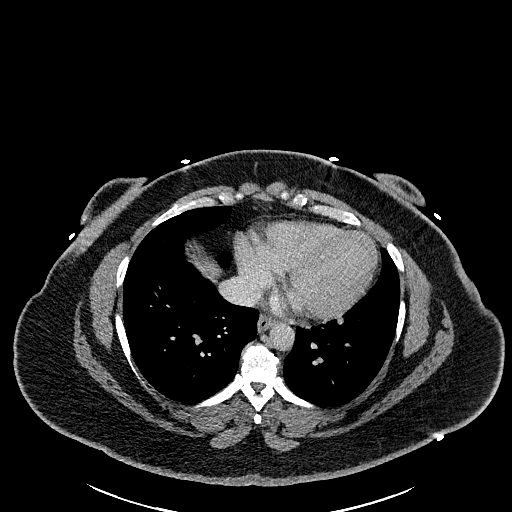
[im 55/143  lung]
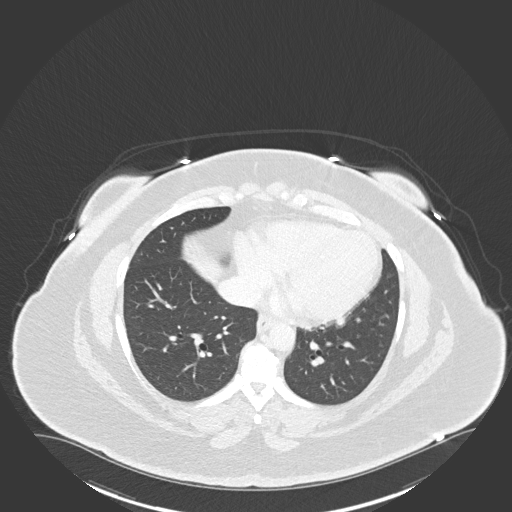
[im 66/143  lung]
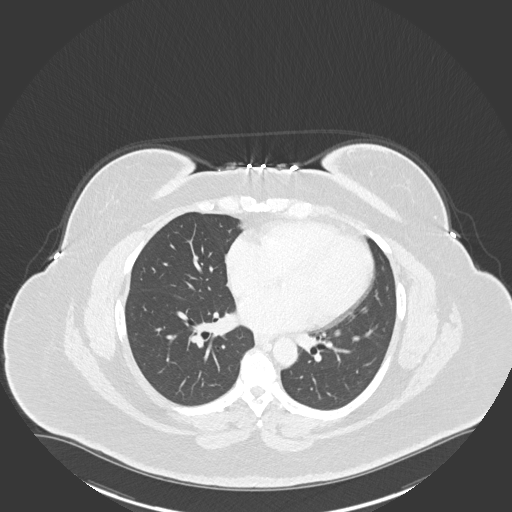
[im 77/143  lung]
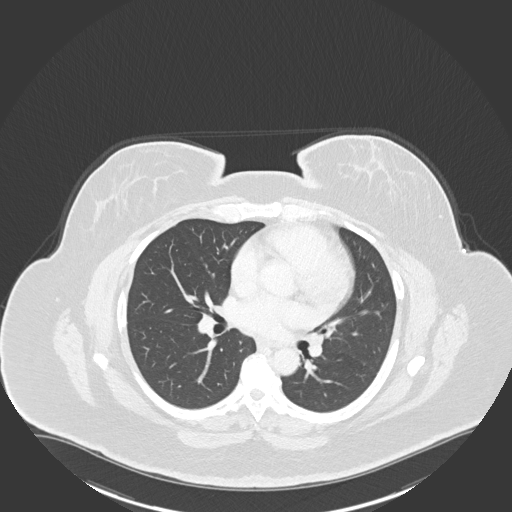
[im 88/143  lung]
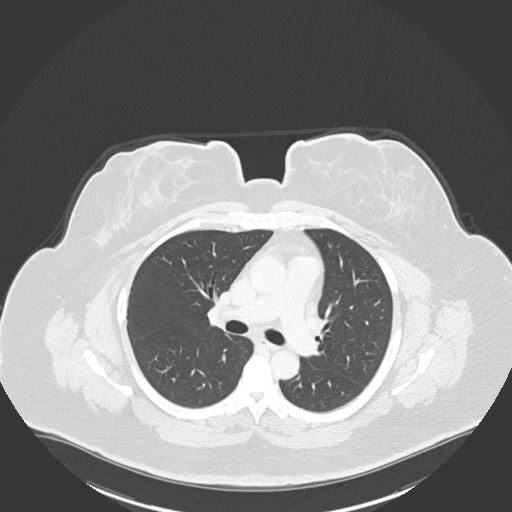
[im 99/143  mediastinal]
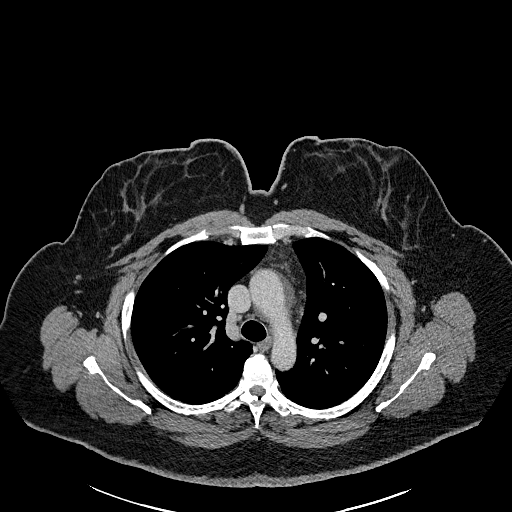
[im 99/143  lung]
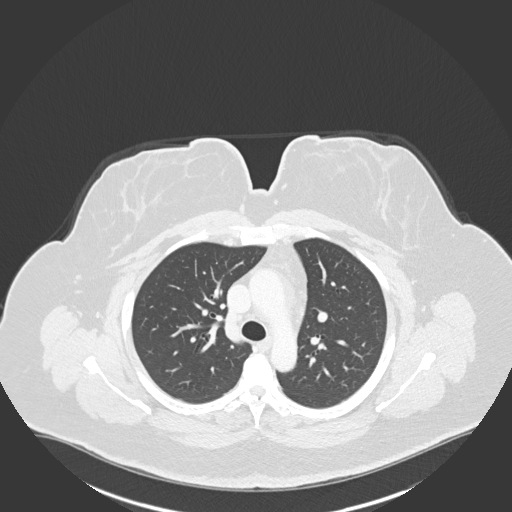
[im 110/143  lung]
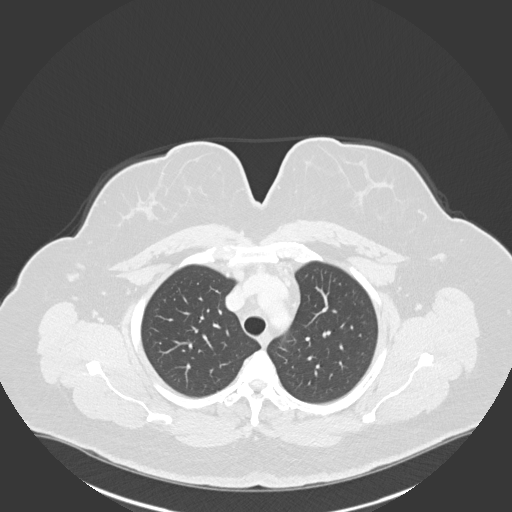
[im 121/143  lung]
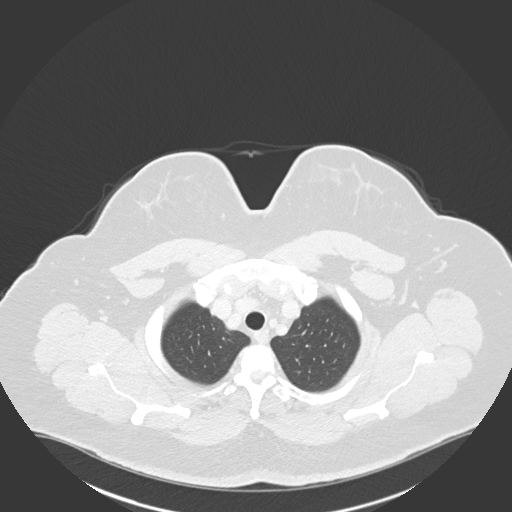
[im 132/143  lung]
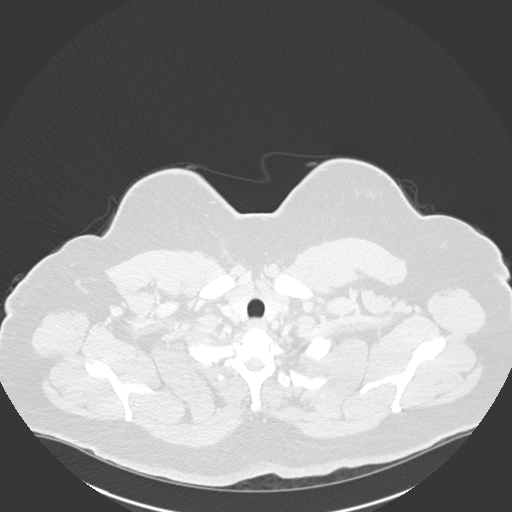

[Series 5: coronal · coronal · 0.61mm/px · 3 of 153 slices shown]
[im 31/153  lung]
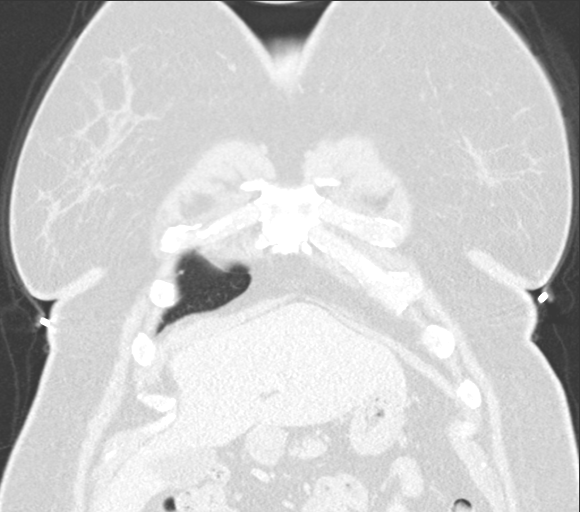
[im 61/153  lung]
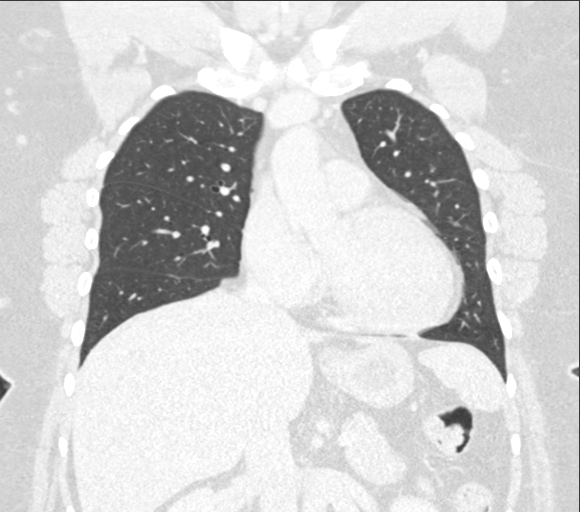
[im 92/153  lung]
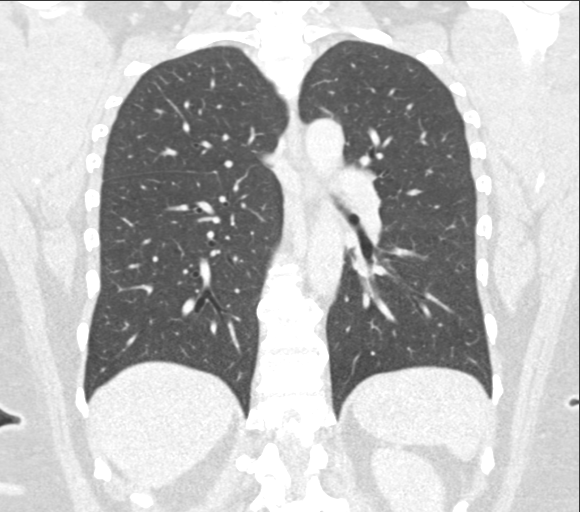

[15 of 36 positions shown; findings below may reference images not displayed]

FINDINGS: Cardiovascular: The heart is normal in size. No pericardial
effusion. The aorta is normal in caliber. No atherosclerotic
calcifications. No definite coronary artery calcifications.

Mediastinum/Nodes: No mediastinal or hilar mass or lymphadenopathy.
Small scattered lymph nodes are noted. The esophagus is normal.

Lungs/Pleura: The lungs are clear. No infiltrates, edema or
effusions. No worrisome pulmonary lesions or nodules. No
interstitial lung disease or bronchiectasis. No endobronchial
lesions are identified.

Upper Abdomen: No significant upper abdominal findings.

Chest wall/musculoskeletal: No breast masses, supraclavicular or
axillary adenopathy. The thyroid gland appears normal.

The bony structures are normal. Mild to moderate degenerative
changes involving the thoracic spine for age.
IMPRESSION: 1. Unremarkable CT examination the chest. No pulmonary abnormalities
and no mediastinal or hilar mass or adenopathy.
2. Normal appearance of the heart and great vessels.

## 2019-02-04 IMAGING — MR MR HEAD W/O CM
7 series · 39 of 48 positions shown · non-contrast
Comparison: 07/06/2018 CT head

CLINICAL DATA: 47 y/o F; episode of left facial numbness,
left-sided weakness, and slurred speech with persistent left-sided
weakness.

EXAM:
MRI HEAD WITHOUT CONTRAST
TECHNIQUE: Sagittal T1 FLAIR, axial DWI, coronal DWI, axial T2 propeller, axial
T2 FLAIR propeller sequences were acquired. The patient declined to
continue the examination and additional sequences were not acquired.

[Series 3: DWI · axial · 3.0mm · 0.94mm/px · z∈[-55,+92]mm · 9 of 100 slices shown (1 of 2)]
[im 1/100]
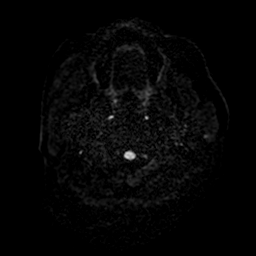
[im 15/100]
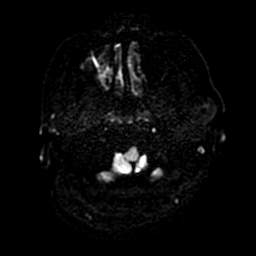
[im 29/100]
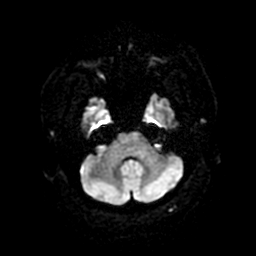
[im 43/100]
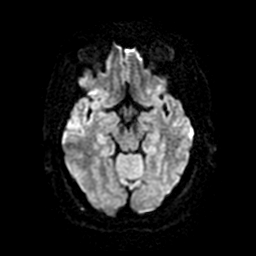
[im 50/100]
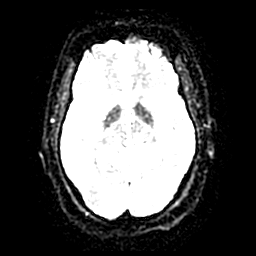
[im 57/100]
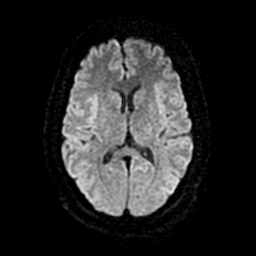
[im 71/100]
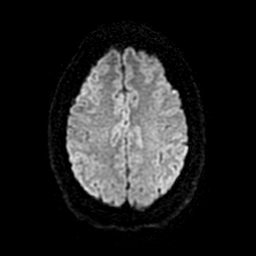
[im 85/100]
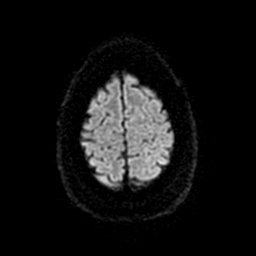
[im 100/100]
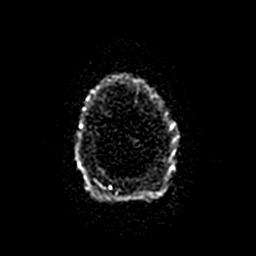

[Series 4: DWI · coronal · 4.0mm · 0.94mm/px · 8 of 72 slices shown (2 of 2)]
[im 1/72]
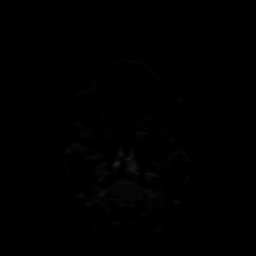
[im 8/72]
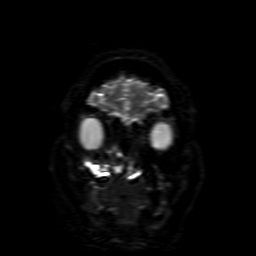
[im 24/72]
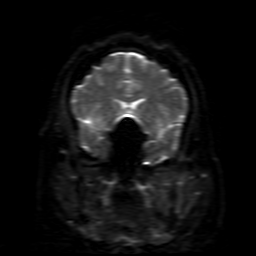
[im 32/72]
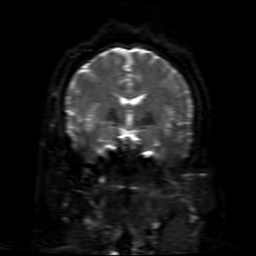
[im 40/72]
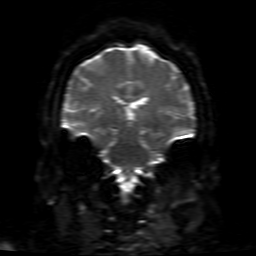
[im 48/72]
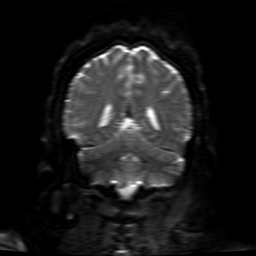
[im 64/72]
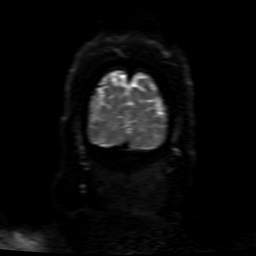
[im 72/72]
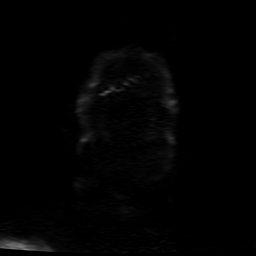

[Series 5: FLAIR · sagittal · 5.0mm · 0.47mm/px · 3 of 23 slices shown (1 of 2)]
[im 1/23]
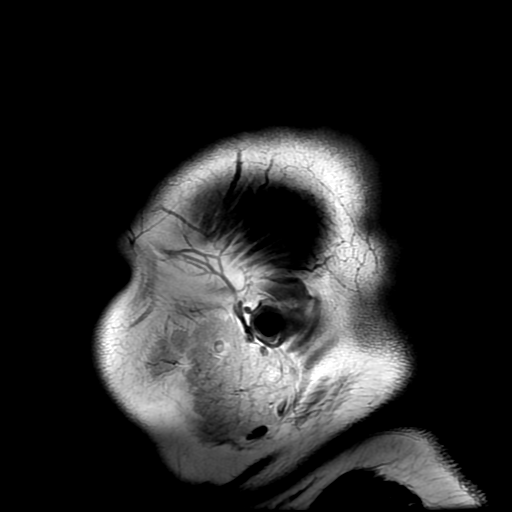
[im 12/23]
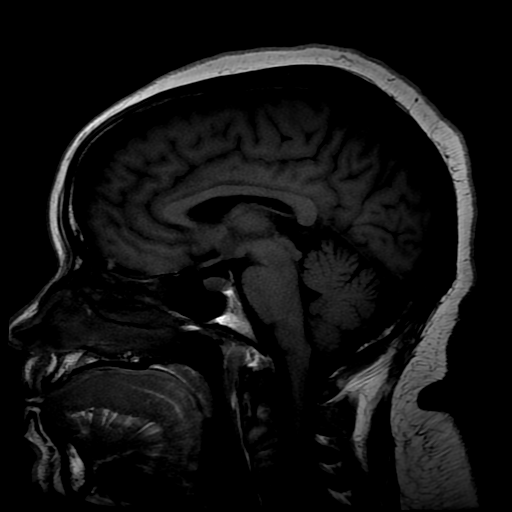
[im 23/23]
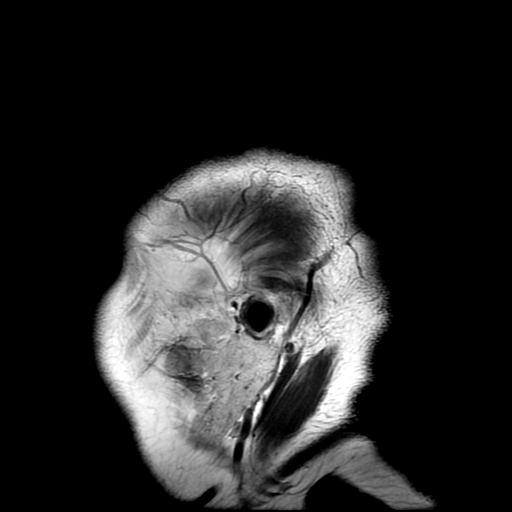

[Series 6: T2 · axial · 5.0mm · 0.47mm/px · z∈[-54,+42]mm · 3 of 25 slices shown]
[im 1/25]
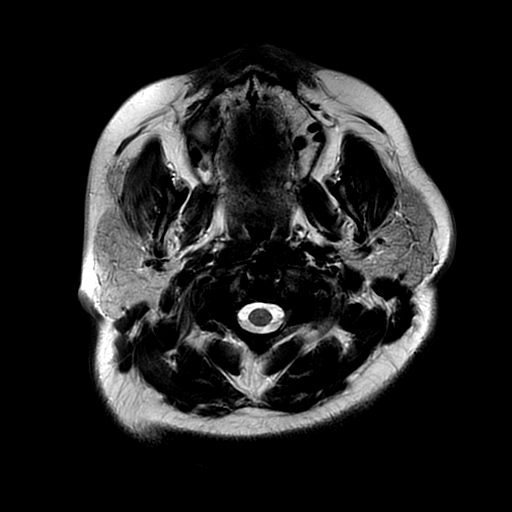
[im 9/25]
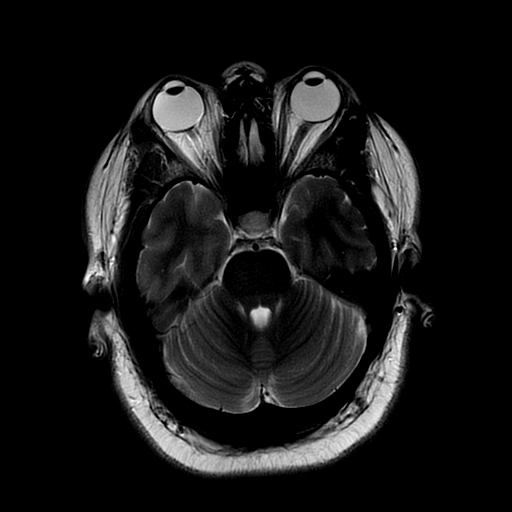
[im 17/25]
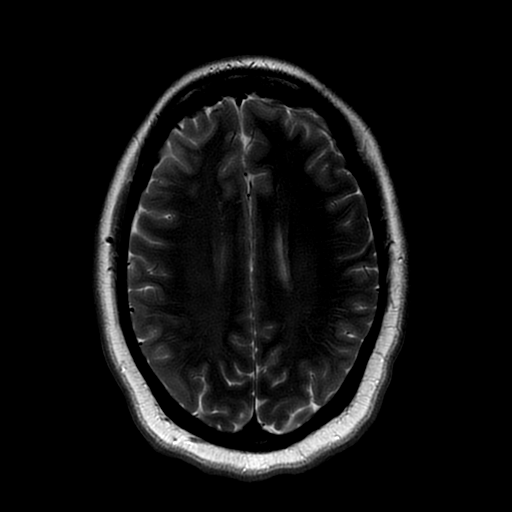

[Series 7: FLAIR · axial · 5.0mm · 0.47mm/px · z∈[-54,+90]mm · 4 of 25 slices shown (2 of 2)]
[im 1/25]
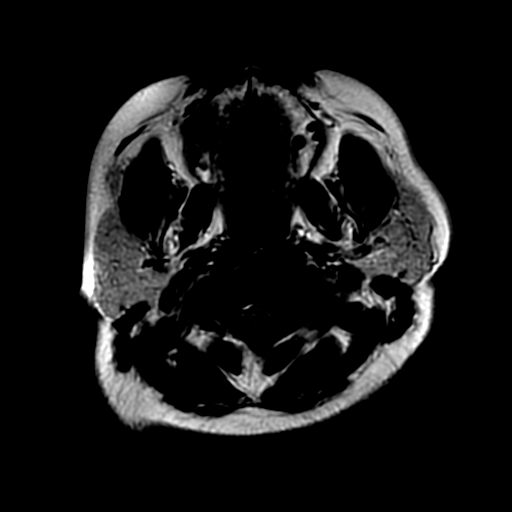
[im 9/25]
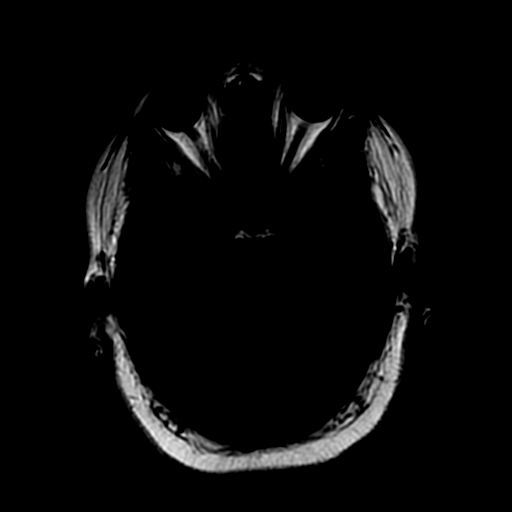
[im 17/25]
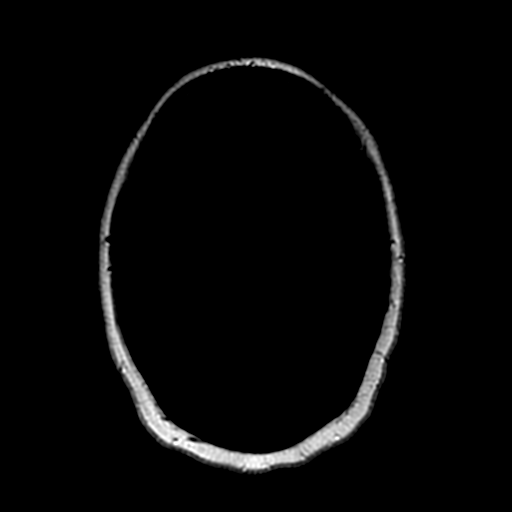
[im 25/25]
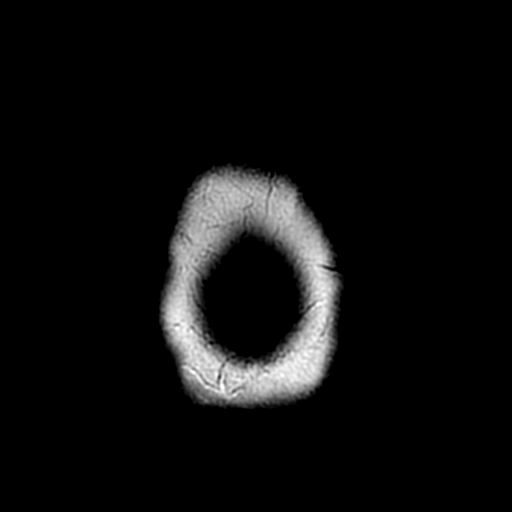

[Series 350: ADC · axial · 3.0mm · 0.94mm/px · z∈[-55,+92]mm · 7 of 50 slices shown (1 of 2)]
[im 1/50]
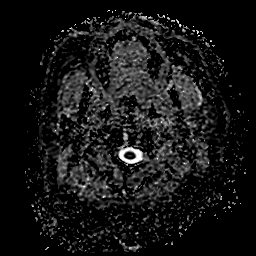
[im 9/50]
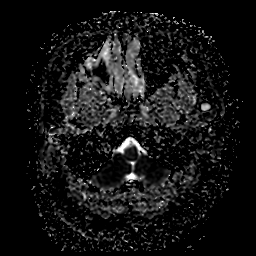
[im 17/50]
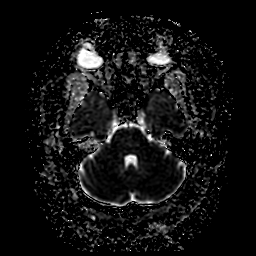
[im 25/50]
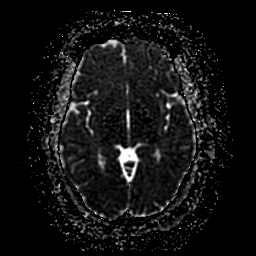
[im 33/50]
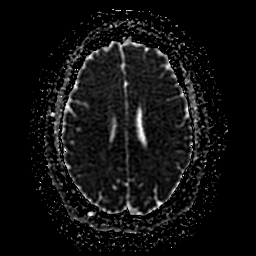
[im 41/50]
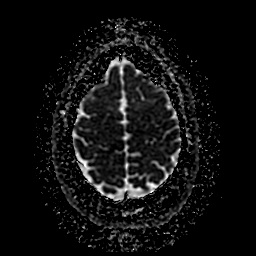
[im 50/50]
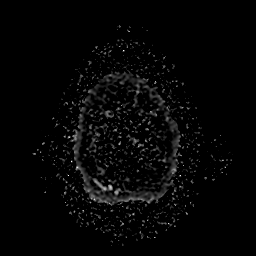

[Series 450: ADC · coronal · 4.0mm · 0.94mm/px · 5 of 36 slices shown (2 of 2)]
[im 1/36]
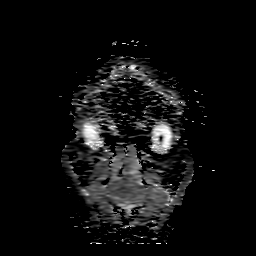
[im 9/36]
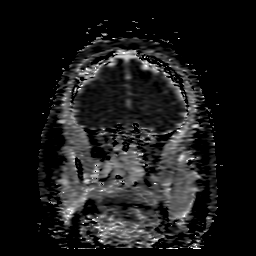
[im 18/36]
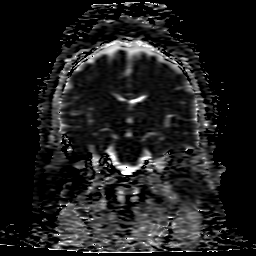
[im 27/36]
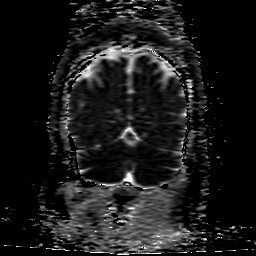
[im 36/36]
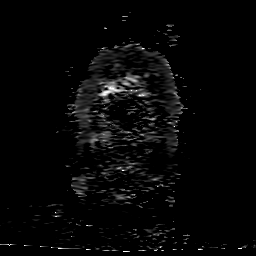

[39 of 48 positions shown; findings below may reference images not displayed]

FINDINGS: Brain: No acute infarction, hemorrhage, hydrocephalus, extra-axial
collection or mass lesion. No structural or signal abnormality of
the brain identified.

Vascular: Normal flow voids.

Skull and upper cervical spine: Normal marrow signal.

Sinuses/Orbits: Bilateral maxillary sinus mucosal thickening with
inspissated right maxillary sinus secretions. No abnormal signal of
the additional paranasal sinuses or the mastoid air cells. Orbits
are unremarkable.

Other: None.
IMPRESSION: No acute intracranial abnormality identified. Unremarkable MRI of
the brain. No signal abnormality corresponding to question lacunar
infarct on prior CT of head, likely CT artifact.

## 2019-02-04 IMAGING — CT CT HEAD CODE STROKE
3 series · 15 of 47 positions shown, 18 images · non-contrast
Comparison: None.

CLINICAL DATA: Code stroke. 47 y/o F; left-sided weakness and
facial numbness.

EXAM:
CT HEAD WITHOUT CONTRAST
TECHNIQUE: Contiguous axial images were obtained from the base of the skull
through the vertex without intravenous contrast.

[Series 3: head 5.0 st · axial · 0.42mm/px · z∈[-141,-1]mm · 9 of 34 slices shown, 12 images]
[im 3/34  brain]
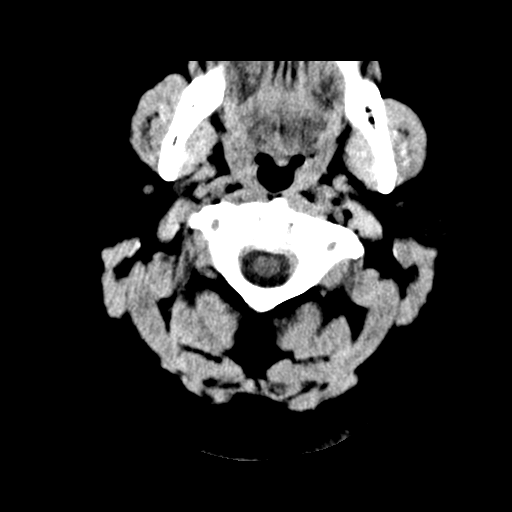
[im 3/34  bone]
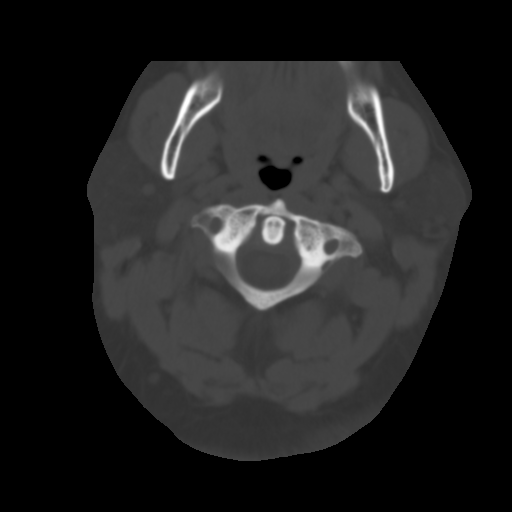
[im 6/34  brain]
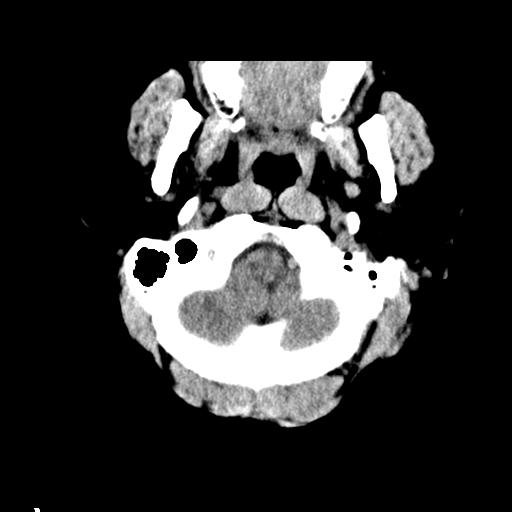
[im 10/34  brain]
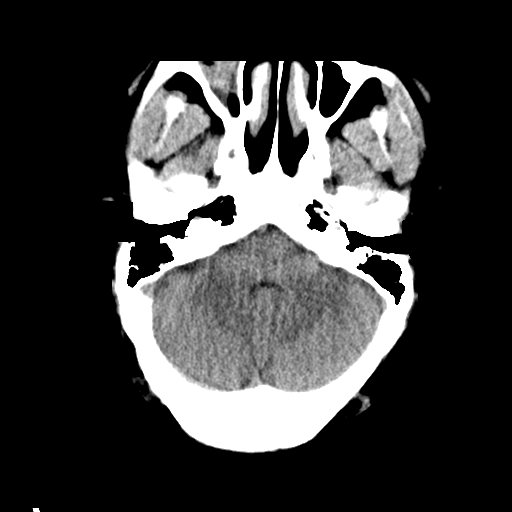
[im 13/34  brain]
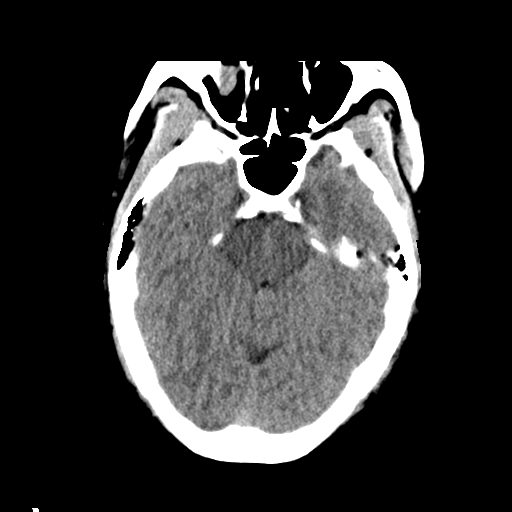
[im 18/34  brain]
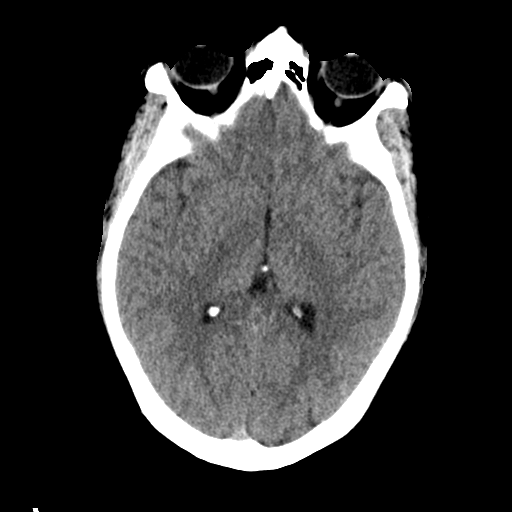
[im 18/34  bone]
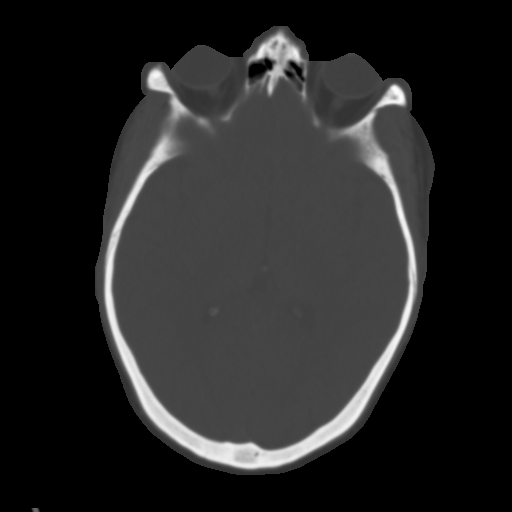
[im 21/34  brain]
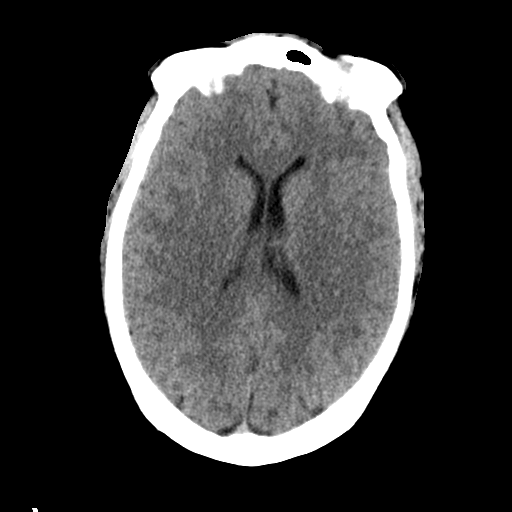
[im 24/34  brain]
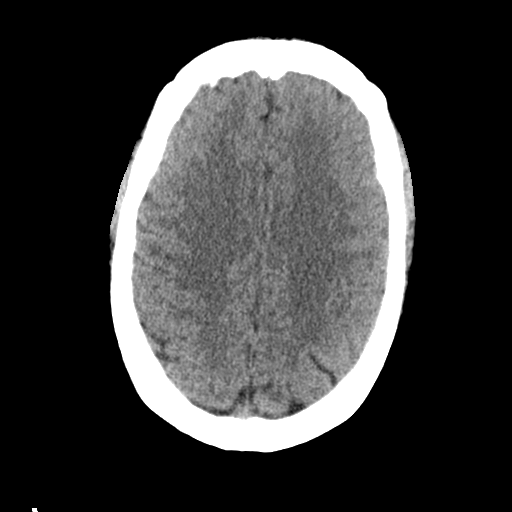
[im 28/34  brain]
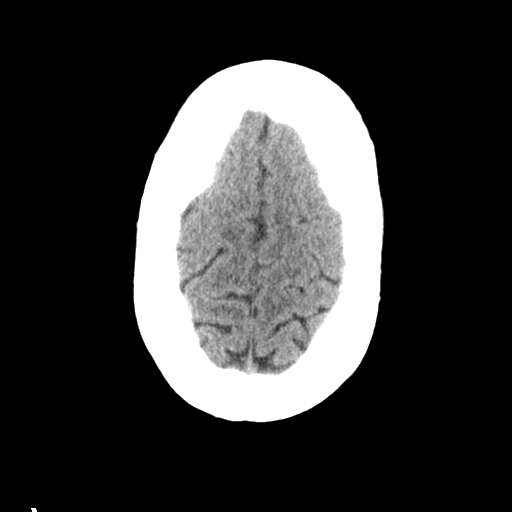
[im 31/34  brain]
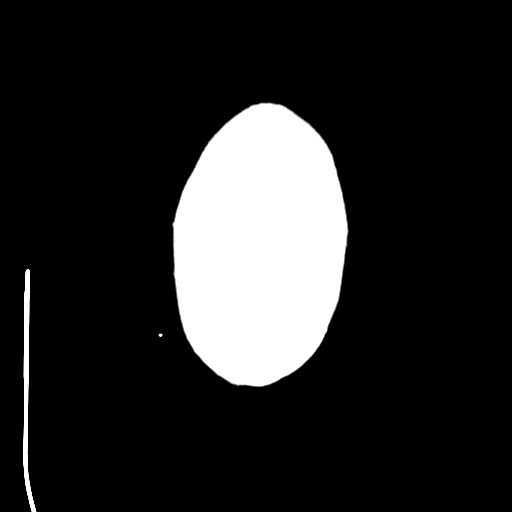
[im 31/34  bone]
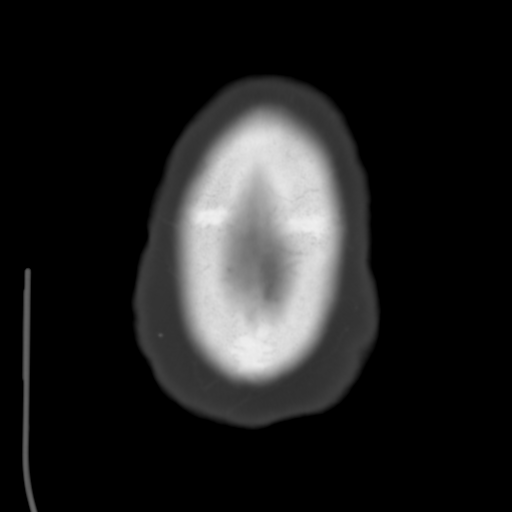

[Series 5: head 3.0 cor st · coronal · 0.34mm/px · 3 of 72 slices shown]
[im 24/72  brain]
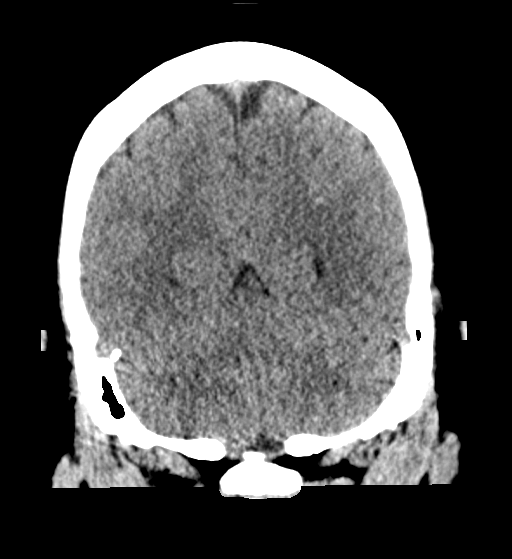
[im 32/72  brain]
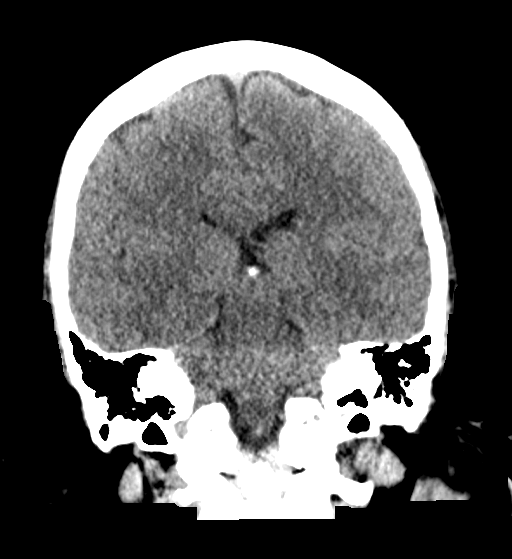
[im 40/72  brain]
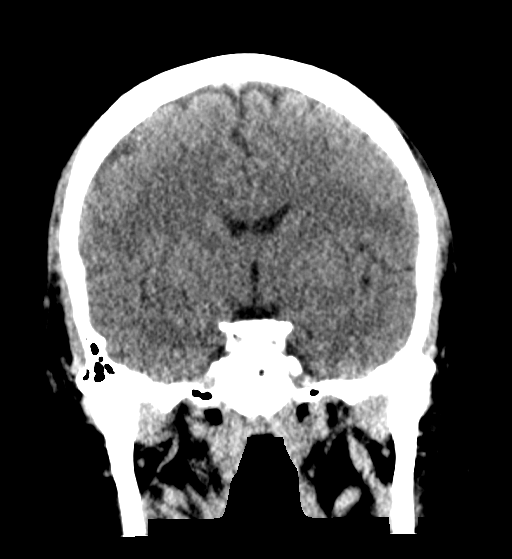

[Series 6: head 3.0 sag st · sagittal · 0.32mm/px · 3 of 55 slices shown]
[im 19/55  brain]
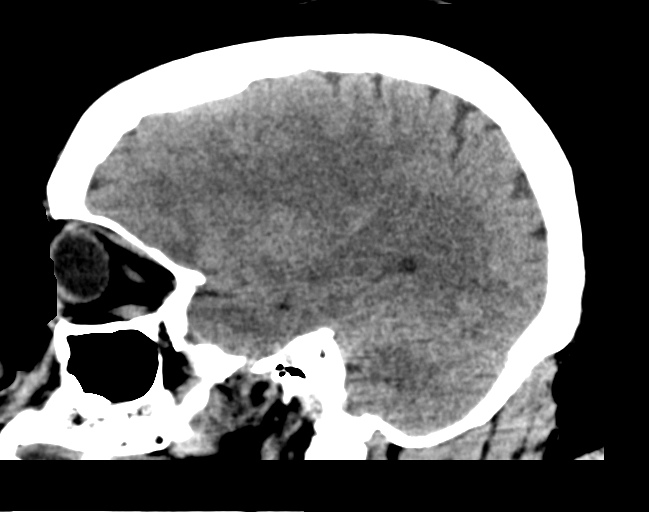
[im 28/55  brain]
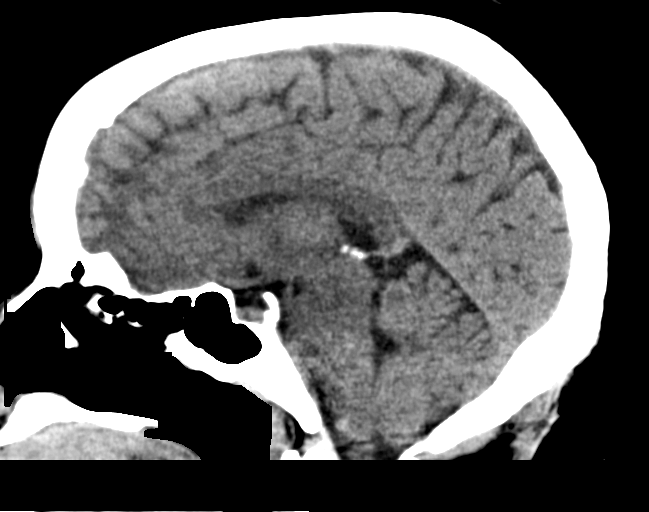
[im 37/55  brain]
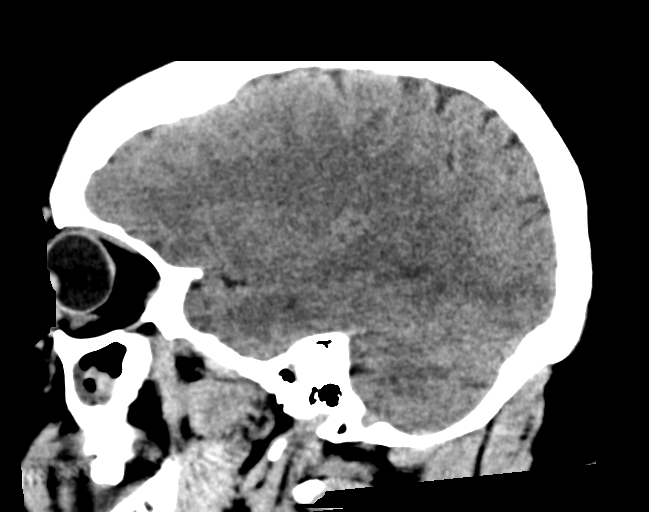

[15 of 47 positions shown; findings below may reference images not displayed]

FINDINGS: Brain: Subcentimeter hypodensity within the right thalamus, possible
lacunar infarction, age indeterminate (series 3, image 19). No
additional findings of acute stroke, hemorrhage, focal mass effect,
extra-axial collection, hydrocephalus, or herniation.

Vascular: No hyperdense vessel or unexpected calcification.

Skull: Normal. Negative for fracture or focal lesion.

Sinuses/Orbits: No acute finding.

Other: None.

ASPECTS (Alberta Stroke Program Early CT Score)

- Ganglionic level infarction (caudate, lentiform nuclei, internal
capsule, insula, M1-M3 cortex): 7

- Supraganglionic infarction (M4-M6 cortex): 3

Total score (0-10 with 10 being normal): 10
IMPRESSION: 1. Subcentimeter lucency within right thalamus, possible lacunar
infarction, age indeterminate.
2. Otherwise unremarkable CT of head.
3. ASPECTS is 10.

These results were called by telephone at the time of interpretation
on 08/06/2018 at [DATE] to Dr. Auntyjatty, who verbally
acknowledged these results.

## 2019-04-08 ENCOUNTER — Ambulatory Visit: Payer: Medicaid Other

## 2019-04-08 ENCOUNTER — Other Ambulatory Visit: Payer: Self-pay | Admitting: Internal Medicine

## 2019-04-08 DIAGNOSIS — Z1231 Encounter for screening mammogram for malignant neoplasm of breast: Secondary | ICD-10-CM

## 2019-04-09 ENCOUNTER — Other Ambulatory Visit: Payer: Self-pay

## 2019-04-09 ENCOUNTER — Ambulatory Visit
Admission: RE | Admit: 2019-04-09 | Discharge: 2019-04-09 | Disposition: A | Discharge status: Home / Self Care | Payer: Medicaid Other | Source: Ambulatory Visit | Attending: Internal Medicine | Admitting: Internal Medicine

## 2019-04-09 DIAGNOSIS — Z1231 Encounter for screening mammogram for malignant neoplasm of breast: Secondary | ICD-10-CM

## 2019-05-17 ENCOUNTER — Encounter (HOSPITAL_COMMUNITY): Payer: Self-pay | Admitting: Emergency Medicine

## 2019-05-17 ENCOUNTER — Other Ambulatory Visit: Payer: Self-pay

## 2019-05-17 ENCOUNTER — Ambulatory Visit (HOSPITAL_COMMUNITY)
Admission: EM | Admit: 2019-05-17 | Discharge: 2019-05-17 | Disposition: A | Discharge status: Home / Self Care | Payer: Medicaid Other | Attending: Urgent Care | Admitting: Urgent Care

## 2019-05-17 DIAGNOSIS — M25512 Pain in left shoulder: Secondary | ICD-10-CM | POA: Diagnosis not present

## 2019-05-17 DIAGNOSIS — Z79899 Other long term (current) drug therapy: Secondary | ICD-10-CM | POA: Insufficient documentation

## 2019-05-17 DIAGNOSIS — Z7951 Long term (current) use of inhaled steroids: Secondary | ICD-10-CM | POA: Insufficient documentation

## 2019-05-17 DIAGNOSIS — R05 Cough: Secondary | ICD-10-CM | POA: Insufficient documentation

## 2019-05-17 DIAGNOSIS — R059 Cough, unspecified: Secondary | ICD-10-CM

## 2019-05-17 DIAGNOSIS — R0789 Other chest pain: Secondary | ICD-10-CM

## 2019-05-17 DIAGNOSIS — Z20828 Contact with and (suspected) exposure to other viral communicable diseases: Secondary | ICD-10-CM | POA: Insufficient documentation

## 2019-05-17 DIAGNOSIS — M549 Dorsalgia, unspecified: Secondary | ICD-10-CM | POA: Diagnosis not present

## 2019-05-17 DIAGNOSIS — K219 Gastro-esophageal reflux disease without esophagitis: Secondary | ICD-10-CM | POA: Diagnosis not present

## 2019-05-17 DIAGNOSIS — J4540 Moderate persistent asthma, uncomplicated: Secondary | ICD-10-CM | POA: Diagnosis present

## 2019-05-17 MED ORDER — MELOXICAM 7.5 MG PO TABS: 7.5000 mg | ORAL_TABLET | Freq: Every day | ORAL | 0 refills | Status: DC

## 2019-05-17 MED ORDER — BENZONATATE 100 MG PO CAPS: 100.0000 mg | ORAL_CAPSULE | Freq: Three times a day (TID) | ORAL | 0 refills | Status: DC | PRN

## 2019-05-17 NOTE — ED Provider Notes (Signed)
MRN: [id number] DOB: October 01, 1971  Subjective:   Joyce Welch is a 48 y.o. female presenting for 1 day history of acute onset persistent intermittent moderate left sided chest pain that radiates to her back/left shoulder. Pain is elicited with deep inspiration. Has dry cough. Has hx of asthma, takes her Dulera daily. Has not used her albuterol inhaler or nebulizer. Denies fever, shob, wheezing, n/v, abdominal pain. Denies smoking cigarettes.    No current facility-administered medications for this encounter.   Current Outpatient Medications:  .  albuterol (PROVENTIL HFA;VENTOLIN HFA) 108 (90 BASE) MCG/ACT inhaler, Inhale 1 puff into the lungs every 6 (six) hours as needed for wheezing or shortness of breath. , Disp: , Rfl:  .  albuterol (PROVENTIL) (2.5 MG/3ML) 0.083% nebulizer solution, Take 2.5 mg by nebulization every 6 (six) hours as needed for wheezing or shortness of breath., Disp: , Rfl:  .  azelastine (ASTELIN) 0.1 % nasal spray, Place 1 spray into both nostrils 2 (two) times daily. Use in each nostril as directed (Patient taking differently: Place 1 spray into both nostrils See admin instructions. Instill 1 spray into each nostril two times a day as directed), Disp: 30 mL, Rfl: 12 .  DULERA 100-5 MCG/ACT AERO, INHALE 2 PUFFS INTO THE LUNGS TWICE DAILY, Disp: 13 g, Rfl: 3 .  fluticasone (FLONASE) 50 MCG/ACT nasal spray, Place 2 sprays into both nostrils daily. , Disp: , Rfl:  .  loratadine (CLARITIN) 10 MG tablet, Take 10 mg by mouth daily., Disp: , Rfl:  .  omeprazole (PRILOSEC) 40 MG capsule, Take 1 capsule (40 mg total) by mouth daily., Disp: 30 capsule, Rfl: 1 .  ibuprofen (ADVIL,MOTRIN) 600 MG tablet, Take 1 tablet (600 mg total) by mouth every 6 (six) hours as needed., Disp: 30 tablet, Rfl: 0    No Known Allergies   Past Medical History:  Diagnosis Date  . Asthma   . Chronic cough   . Fibroids   . GERD (gastroesophageal reflux disease)   . OAB (overactive bladder)       Past Surgical History:  Procedure Laterality Date  . ABDOMINAL HYSTERECTOMY  02/05/2006   partial  . [place], 2002, 2005,    x 3  . MAXILLARY ANTROSTOMY Right 10/07/2018   Procedure: RIGHT MAXILLARY ANTROSTOMY WITH TISSUE REMOVAL.;  Surgeon: Melida Quitter, MD;  Location: [facility];  Service: ENT;  Laterality: Right;    ROS  Objective:   Vitals: BP (!) 150/87 (BP Location: Left Arm) Comment: reported BP to Nurse Kenton Kingfisher  Pulse (!) 102 Comment: Reported HR to Nurse Kenton Kingfisher  Temp 98.1 F (36.7 C) (Tympanic)   Resp 16   SpO2 99%   Physical Exam Constitutional:      General: She is not in acute distress.    Appearance: Normal appearance. She is well-developed. She is obese. She is not ill-appearing, toxic-appearing or diaphoretic.  HENT:     Head: Normocephalic and atraumatic.     Nose: Nose normal.     Mouth/Throat:     Mouth: Mucous membranes are moist.  Eyes:     Extraocular Movements: Extraocular movements intact.     Pupils: Pupils are equal, round, and reactive to light.  Cardiovascular:     Rate and Rhythm: Normal rate and regular rhythm.     Pulses: Normal pulses.     Heart sounds: Normal heart sounds. No murmur. No friction rub. No gallop.   Pulmonary:     Effort: Pulmonary  effort is normal. No respiratory distress.     Breath sounds: Normal breath sounds. No stridor. No wheezing, rhonchi or rales.  Skin:    General: Skin is warm and dry.     Findings: No rash.  Neurological:     Mental Status: She is alert and oriented to person, place, and time.  Psychiatric:        Mood and Affect: Mood normal.        Behavior: Behavior normal.        Thought Content: Thought content normal.     ED ECG REPORT   Date: 05/17/2019  Rate: 87  Rhythm: normal sinus rhythm  QRS Axis: normal  Intervals: normal  ST/T Wave abnormalities: normal  Conduction Disutrbances:none  Narrative Interpretation: Flipped p-waves in lead V2 which is  different from previous EKG but otherwise sinus rhythm at 87 bpm.  Old EKG Reviewed: changes noted  I have personally reviewed the EKG tracing and agree with the computerized printout as noted.   Assessment and Plan :   1. Cough   2. Atypical chest pain   3. Moderate persistent extrinsic asthma without complication   4. Gastroesophageal reflux disease, esophagitis presence not specified   5. Upper back pain   6. Acute pain of left shoulder     COVID-19 testing pending, low suspicion for this.  EKG reassuring with minor change in 1-lead only. Reviewed with Dr. Mannie Stabile.  Strict ER precautions.  Patient is to schedule her albuterol inhaler, maintain her Dulera.  Use meloxicam and restart Flexeril.  Provided patient with Tessalon for cough suppression.  Maintain very close follow-up with PCP. Counseled patient on potential for adverse effects with medications prescribed/recommended today, ER and return-to-clinic precautions discussed, patient verbalized understanding.    Jaynee Eagles, [place] 05/17/19 1202

## 2019-05-17 NOTE — Discharge Instructions (Signed)
Please make sure you restart your Flexeril to help with your chest pain.  You may also use meloxicam, this is an NSAID similar to Motrin or Advil but is better.  Make sure you use your albuterol inhaler as needed to avoid bronchospasms.  Keep practicing a healthy diet and maintain your omeprazole.  If your chest pain worsens and is not controlled by our treatment plan today, then please report to the emergency room to make sure your heart is doing okay.

## 2019-05-17 NOTE — ED Triage Notes (Signed)
Patient reports a history of asthma and asthma started bothering her yesterday.  Patient also has left chest.left shoulder pain.  Hard to describe pain "its just a pain".  Patient has muscle relaxer for similar pain-prescribed by pcp Pain is worse with deep inspiration.   No nausea, no vomiting

## 2019-05-18 LAB — NOVEL CORONAVIRUS, NAA (HOSP ORDER, SEND-OUT TO REF LAB; TAT 18-24 HRS): SARS-CoV-2, NAA: NOT DETECTED

## 2019-06-21 ENCOUNTER — Ambulatory Visit (HOSPITAL_COMMUNITY)
Admission: EM | Admit: 2019-06-21 | Discharge: 2019-06-21 | Disposition: A | Discharge status: Home / Self Care | Payer: Medicaid Other | Attending: Family Medicine | Admitting: Family Medicine

## 2019-06-21 ENCOUNTER — Other Ambulatory Visit: Payer: Self-pay

## 2019-06-21 ENCOUNTER — Encounter (HOSPITAL_COMMUNITY): Payer: Self-pay

## 2019-06-21 DIAGNOSIS — R079 Chest pain, unspecified: Secondary | ICD-10-CM | POA: Diagnosis not present

## 2019-06-21 NOTE — ED Triage Notes (Signed)
Pt presents for complaints of chest pain x 3 days. States that it feels like a tightness and a gas feeling. Denies any relief with OTC medication and muscle relaxer's. Denies any other symptoms.

## 2019-06-21 NOTE — Discharge Instructions (Addendum)
Your EKG was normal here today Call your doctor on Monday about the cardiology referral.  If your chest pain worsens or becomes more persistent then go to the ER

## 2019-06-22 ENCOUNTER — Emergency Department (HOSPITAL_COMMUNITY)
Admission: EM | Admit: 2019-06-22 | Discharge: 2019-06-22 | Disposition: A | Discharge status: Home / Self Care | Payer: Medicaid Other | Attending: Emergency Medicine | Admitting: Emergency Medicine

## 2019-06-22 ENCOUNTER — Emergency Department (HOSPITAL_COMMUNITY): Payer: Medicaid Other

## 2019-06-22 ENCOUNTER — Encounter (HOSPITAL_COMMUNITY): Payer: Self-pay | Admitting: *Deleted

## 2019-06-22 ENCOUNTER — Other Ambulatory Visit: Payer: Self-pay

## 2019-06-22 DIAGNOSIS — J45909 Unspecified asthma, uncomplicated: Secondary | ICD-10-CM | POA: Diagnosis not present

## 2019-06-22 DIAGNOSIS — K2100 Gastro-esophageal reflux disease with esophagitis, without bleeding: Secondary | ICD-10-CM

## 2019-06-22 DIAGNOSIS — Z79899 Other long term (current) drug therapy: Secondary | ICD-10-CM | POA: Diagnosis not present

## 2019-06-22 DIAGNOSIS — R079 Chest pain, unspecified: Secondary | ICD-10-CM | POA: Diagnosis present

## 2019-06-22 LAB — CBC
HCT: 42.1 % (ref 36.0–46.0)
Hemoglobin: 12.7 g/dL (ref 12.0–15.0)
MCH: 24.9 pg — ABNORMAL LOW (ref 26.0–34.0)
MCHC: 30.2 g/dL (ref 30.0–36.0)
MCV: 82.4 fL (ref 80.0–100.0)
Platelets: 346 10*3/uL (ref 150–400)
RBC: 5.11 MIL/uL (ref 3.87–5.11)
RDW: 13.2 % (ref 11.5–15.5)
WBC: 12.8 10*3/uL — ABNORMAL HIGH (ref 4.0–10.5)
nRBC: 0 % (ref 0.0–0.2)

## 2019-06-22 LAB — HEPATIC FUNCTION PANEL
ALT: 20 U/L (ref 0–44)
AST: 22 U/L (ref 15–41)
Albumin: 3.5 g/dL (ref 3.5–5.0)
Alkaline Phosphatase: 110 U/L (ref 38–126)
Bilirubin, Direct: <0.1 mg/dL (ref 0.0–0.2)
Total Bilirubin: 0.2 mg/dL — ABNORMAL LOW (ref 0.3–1.2)
Total Protein: 8.1 g/dL (ref 6.5–8.1)

## 2019-06-22 LAB — BASIC METABOLIC PANEL
Anion gap: 10 (ref 5–15)
BUN: 13 mg/dL (ref 6–20)
CO2: 25 mmol/L (ref 22–32)
Calcium: 9.7 mg/dL (ref 8.9–10.3)
Chloride: 103 mmol/L (ref 98–111)
Creatinine, Ser: 0.95 mg/dL (ref 0.44–1.00)
GFR calc Af Amer: >60 mL/min (ref >60)
GFR calc non Af Amer: >60 mL/min (ref >60)
Glucose, Bld: 99 mg/dL (ref 70–99)
Potassium: 4.6 mmol/L (ref 3.5–5.1)
Sodium: 138 mmol/L (ref 135–145)

## 2019-06-22 LAB — I-STAT BETA HCG BLOOD, ED (MC, WL, AP ONLY): I-stat hCG, quantitative: <5 m[IU]/mL (ref <5)

## 2019-06-22 LAB — LIPASE, BLOOD: Lipase: 28 U/L (ref 11–51)

## 2019-06-22 LAB — TROPONIN I (HIGH SENSITIVITY): Troponin I (High Sensitivity): 5 ng/L (ref <18)

## 2019-06-22 MED ORDER — SODIUM CHLORIDE 0.9% FLUSH: 3.0000 mL | Freq: Once | INTRAVENOUS | Status: DC

## 2019-06-22 MED ORDER — SUCRALFATE 1 G PO TABS
1.0000 g | ORAL_TABLET | Freq: Once | ORAL | Status: AC
  Administered 2019-06-22: 1 g via ORAL
  Filled 2019-06-22: qty 1

## 2019-06-22 MED ORDER — SUCRALFATE 1 G PO TABS: 1.0000 g | ORAL_TABLET | Freq: Four times a day (QID) | ORAL | 0 refills | Status: DC

## 2019-06-22 MED ORDER — LIDOCAINE VISCOUS HCL 2 % MT SOLN
15.0000 mL | Freq: Once | OROMUCOSAL | Status: AC
  Administered 2019-06-22: 15 mL via ORAL
  Filled 2019-06-22: qty 15

## 2019-06-22 MED ORDER — ALUM & MAG HYDROXIDE-SIMETH 200-200-20 MG/5ML PO SUSP
30.0000 mL | Freq: Once | ORAL | Status: AC
  Administered 2019-06-22: 30 mL via ORAL
  Filled 2019-06-22: qty 30

## 2019-06-22 MED ORDER — PANTOPRAZOLE SODIUM 20 MG PO TBEC: 20.0000 mg | DELAYED_RELEASE_TABLET | Freq: Every day | ORAL | 0 refills | Status: DC

## 2019-06-22 MED ORDER — FAMOTIDINE 20 MG PO TABS
40.0000 mg | ORAL_TABLET | Freq: Once | ORAL | Status: AC
  Administered 2019-06-22: 40 mg via ORAL
  Filled 2019-06-22: qty 2

## 2019-06-22 NOTE — ED Provider Notes (Signed)
[facility] EMERGENCY DEPARTMENT Provider Note   CSN: [id number] Arrival date & time: 06/22/19  1522     History   Chief Complaint Chief Complaint  Patient presents with  . Chest Pain    HPI Joyce Welch is a 48 y.o. female.     48 year old female presents with 4 days of persistent epigastric and substernal chest burning.  Patient states that is worse with eating, lying flat and relieved somewhat with belching.  Denies any associated exertional dyspnea.  No diaphoresis.  Some nausea but no vomiting.  Seen in urgent care yesterday for similar symptoms and not felt to be cardiac in etiology.  No recent fever or cough.  Denies any leg pain or swelling.  No pleuritic component.  Pain radiates to her left upper abdomen.  Notes that she has had normal bowel movement without dark or bloody stools.  No new treatments used prior to arrival     Past Medical History:  Diagnosis Date  . Asthma   . Chronic cough   . Fibroids   . GERD (gastroesophageal reflux disease)   . OAB (overactive bladder)     Patient Active Problem List   Diagnosis Date Noted  . Paresthesia 10/17/2018  . Weakness 10/17/2018  . Cough 06/20/2018  . Post-nasal drip 06/20/2018  . Globus sensation 06/20/2018  . Asthma exacerbation 10/31/2017  . Leukocytosis 10/31/2017  . Strain of quadriceps tendon 02/14/2014  . Symptomatic menopausal or female climacteric states 07/31/2013    Past Surgical History:  Procedure Laterality Date  . ABDOMINAL HYSTERECTOMY  02/05/2006   partial  . [place], 2002, 2005,    x 3  . MAXILLARY ANTROSTOMY Right 10/07/2018   Procedure: RIGHT MAXILLARY ANTROSTOMY WITH TISSUE REMOVAL.;  Surgeon: Melida Quitter, MD;  Location: [place];  Service: ENT;  Laterality: Right;     OB History    Gravida  3   Para  3   Term  1   Preterm      AB      Living  3     SAB      TAB      Ectopic      Multiple      Live Births   3            Home Medications    Prior to Admission medications   Medication Sig Start Date End Date Taking? Authorizing Provider  albuterol (PROVENTIL HFA;VENTOLIN HFA) 108 (90 BASE) MCG/ACT inhaler Inhale 1 puff into the lungs every 6 (six) hours as needed for wheezing or shortness of breath.     [provider]  albuterol (PROVENTIL) (2.5 MG/3ML) 0.083% nebulizer solution Take 2.5 mg by nebulization every 6 (six) hours as needed for wheezing or shortness of breath.    [provider]  azelastine (ASTELIN) 0.1 % nasal spray Place 1 spray into both nostrils 2 (two) times daily. Use in each nostril as directed Patient taking differently: Place 1 spray into both nostrils See admin instructions. Instill 1 spray into each nostril two times a day as directed 06/04/18   [name], MD  benzonatate (TESSALON) 100 MG capsule Take 1-2 capsules (100-200 mg total) by mouth 3 (three) times daily as needed. 05/17/19   Jaynee Eagles, PA-C  DULERA 100-5 MCG/ACT AERO INHALE 2 PUFFS INTO THE LUNGS TWICE DAILY 11/15/18   Fenton Foy, NP  fluticasone [facility]) 50 MCG/ACT nasal spray Place 2 sprays into both  nostrils daily.     [provider]  ibuprofen (ADVIL,MOTRIN) 600 MG tablet Take 1 tablet (600 mg total) by mouth every 6 (six) hours as needed. 11/24/18   Isla Pence, MD  loratadine (CLARITIN) 10 MG tablet Take 10 mg by mouth daily.    [provider]  meloxicam (MOBIC) 7.5 MG tablet Take 1 tablet (7.5 mg total) by mouth daily. 05/17/19   Jaynee Eagles, PA-C  omeprazole (PRILOSEC) 40 MG capsule Take 1 capsule (40 mg total) by mouth daily. 06/04/18   [name], MD    Family History Family History  Problem Relation Age of Onset  . Hypertension Mother   . Other Father        unsure of medical history  . Breast cancer Maternal Aunt     Social History Social History   Tobacco Use  . Smoking status: Never Smoker  . Smokeless tobacco: Never Used  Substance  Use Topics  . Alcohol use: Not Currently  . Drug use: No     Allergies   Patient has no known allergies.   Review of Systems Review of Systems  All other systems reviewed and are negative.    Physical Exam Updated Vital Signs BP (!) 174/97 (BP Location: Right Arm)   Pulse (!) 108   Temp 98.3 F (36.8 C)   Resp (!) 22   SpO2 100%   Physical Exam Vitals signs and nursing note reviewed.  Constitutional:      General: She is not in acute distress.    Appearance: Normal appearance. She is well-developed. She is not toxic-appearing.  HENT:     Head: Normocephalic and atraumatic.  Eyes:     General: Lids are normal.     Conjunctiva/sclera: Conjunctivae normal.     Pupils: Pupils are equal, round, and reactive to light.  Neck:     Musculoskeletal: Normal range of motion and neck supple.     Thyroid: No thyroid mass.     Trachea: No tracheal deviation.  Cardiovascular:     Rate and Rhythm: Normal rate and regular rhythm.     Heart sounds: Normal heart sounds. No murmur. No gallop.   Pulmonary:     Effort: Pulmonary effort is normal. No respiratory distress.     Breath sounds: Normal breath sounds. No stridor. No decreased breath sounds, wheezing, rhonchi or rales.  Abdominal:     General: Bowel sounds are normal. There is no distension.     Palpations: Abdomen is soft.     Tenderness: There is abdominal tenderness in the epigastric area. There is no guarding or rebound.    Musculoskeletal: Normal range of motion.        General: No tenderness.  Skin:    General: Skin is warm and dry.     Findings: No abrasion or rash.  Neurological:     Mental Status: She is alert and oriented to person, place, and time.     GCS: GCS eye subscore is 4. GCS verbal subscore is 5. GCS motor subscore is 6.     Cranial Nerves: No cranial nerve deficit.     Sensory: No sensory deficit.  Psychiatric:        Speech: Speech normal.        Behavior: Behavior normal.      ED  Treatments / Results  Labs (all labs ordered are listed, but only abnormal results are displayed) Labs Reviewed  CBC - Abnormal; Notable for the following components:  Result Value   WBC 12.8 (*)    MCH 24.9 (*)    All other components within normal limits  BASIC METABOLIC PANEL  HEPATIC FUNCTION PANEL  LIPASE, BLOOD  I-STAT BETA HCG BLOOD, ED ([facility], [facility], [facility] ONLY)  TROPONIN I (HIGH SENSITIVITY)    EKG EKG Interpretation  Date/Time:  Sunday June 22 2019 15:29:27 EDT Ventricular Rate:  106 PR Interval:  152 QRS Duration: 70 QT Interval:  328 QTC Calculation: 435 R Axis:   16 Text Interpretation:  Sinus tachycardia Biatrial enlargement Abnormal ECG Confirmed by Lacretia Leigh ([id number]) on 06/22/2019 3:50:23 PM   Radiology Dg Chest 2 View  Result Date: 06/22/2019 CLINICAL DATA:  Chest EXAM: CHEST - 2 VIEW COMPARISON:  Chest x-ray dated 05/18/2018. FINDINGS: The heart size and mediastinal contours are within normal limits. Both lungs are clear. The visualized skeletal structures are unremarkable. IMPRESSION: No active cardiopulmonary disease. No evidence of pneumonia or pulmonary edema. Electronically Signed   By: Franki Cabot M.D.   On: 06/22/2019 15:56    Procedures Procedures (including critical care time)  Medications Ordered in ED Medications  sodium chloride flush (NS) 0.9 % injection 3 mL (has no administration in time range)  alum & mag hydroxide-simeth (MAALOX/MYLANTA) 200-200-20 MG/5ML suspension 30 mL (has no administration in time range)    And  lidocaine (XYLOCAINE) 2 % viscous mouth solution 15 mL (has no administration in time range)  famotidine (PEPCID) tablet 40 mg (has no administration in time range)  sucralfate (CARAFATE) tablet 1 g (has no administration in time range)     Initial Impression / Assessment and Plan / ED Course  I have reviewed the triage vital signs and the nursing notes.  Pertinent labs & imaging results that were available  during my care of the patient were reviewed by me and considered in my medical decision making (see chart for details).       Chest x-ray without acute findings.  Troponin negative.  Low suspicion for ACS.  Patient had belching and flatus and feels better Patient treated for acid reflux and feels better.  Final Clinical Impressions(s) / ED Diagnoses   Final diagnoses:  None    ED Discharge Orders    None       Lacretia Leigh, MD 06/22/19 1758

## 2019-06-22 NOTE — ED Notes (Signed)
MD at bedside. 

## 2019-06-22 NOTE — ED Provider Notes (Signed)
[place]    CSN: [id number] Arrival date & time: 06/21/19  1513      History   Chief Complaint Chief Complaint  Patient presents with  . Chest Pain    HPI Joyce Welch is a 48 y.o. female.   Patient is a 48 year old female with past medical history of asthma, fibroids, GERD.  She is presenting today with approximate 3 days of intermittent chest discomfort.  Currently asymptomatic.  Feels like tightness and gas in her chest.  Took Gas-X without much relief.  Currently awaiting cardiology work-up.  The chest pain is in the center of her chest and describes as aching.  She did have a lot of belching and gas.  No shortness of breath or wheezing.  Denies any recent respiratory illness.  No fever.  No history of heart disease in family history.  No history of DVT, PE.  No recent traveling.  She does not smoke. Takes PPI daily.   ROS per HPI      Past Medical History:  Diagnosis Date  . Asthma   . Chronic cough   . Fibroids   . GERD (gastroesophageal reflux disease)   . OAB (overactive bladder)     Patient Active Problem List   Diagnosis Date Noted  . Paresthesia 10/17/2018  . Weakness 10/17/2018  . Cough 06/20/2018  . Post-nasal drip 06/20/2018  . Globus sensation 06/20/2018  . Asthma exacerbation 10/31/2017  . Leukocytosis 10/31/2017  . Strain of quadriceps tendon 02/14/2014  . Symptomatic menopausal or female climacteric states 07/31/2013    Past Surgical History:  Procedure Laterality Date  . ABDOMINAL HYSTERECTOMY  02/05/2006   partial  . [place], 2002, 2005,    x 3  . MAXILLARY ANTROSTOMY Right 10/07/2018   Procedure: RIGHT MAXILLARY ANTROSTOMY WITH TISSUE REMOVAL.;  Surgeon: Melida Quitter, MD;  Location: [place] [facility];  Service: ENT;  Laterality: Right;    OB History    Gravida  3   Para  3   Term  1   Preterm      AB      Living  3     SAB      TAB      Ectopic      Multiple      Live Births   3            Home Medications    Prior to Admission medications   Medication Sig Start Date End Date Taking? Authorizing Provider  albuterol (PROVENTIL HFA;VENTOLIN HFA) 108 (90 BASE) MCG/ACT inhaler Inhale 1 puff into the lungs every 6 (six) hours as needed for wheezing or shortness of breath.    Yes [provider]  albuterol (PROVENTIL) (2.5 MG/3ML) 0.083% nebulizer solution Take 2.5 mg by nebulization every 6 (six) hours as needed for wheezing or shortness of breath.   Yes [provider]  DULERA 100-5 MCG/ACT AERO INHALE 2 PUFFS INTO THE LUNGS TWICE DAILY 11/15/18  Yes Fenton Foy, NP  fluticasone (FLONASE) 50 MCG/ACT nasal spray Place 2 sprays into both nostrils daily.    Yes [provider]  loratadine (CLARITIN) 10 MG tablet Take 10 mg by mouth daily.   Yes [provider]  meloxicam (MOBIC) 7.5 MG tablet Take 1 tablet (7.5 mg total) by mouth daily. 05/17/19  Yes Jaynee Eagles, PA-C  omeprazole (PRILOSEC) 40 MG capsule Take 1 capsule (40 mg total) by mouth daily. 06/04/18  Yes [name], MD  azelastine (ASTELIN) 0.1 % nasal spray Place 1 spray into both nostrils 2 (two) times daily. Use in each nostril as directed Patient taking differently: Place 1 spray into both nostrils See admin instructions. Instill 1 spray into each nostril two times a day as directed 06/04/18   [name], MD  benzonatate (TESSALON) 100 MG capsule Take 1-2 capsules (100-200 mg total) by mouth 3 (three) times daily as needed. 05/17/19   Jaynee Eagles, PA-C  ibuprofen (ADVIL,MOTRIN) 600 MG tablet Take 1 tablet (600 mg total) by mouth every 6 (six) hours as needed. 11/24/18   Isla Pence, MD    Family History Family History  Problem Relation Age of Onset  . Hypertension Mother   . Other Father        unsure of medical history  . Breast cancer Maternal Aunt     Social History Social History   Tobacco Use  . Smoking status: Never Smoker  . Smokeless tobacco:  Never Used  Substance Use Topics  . Alcohol use: Not Currently  . Drug use: No     Allergies   Patient has no known allergies.   Review of Systems Review of Systems   Physical Exam Triage Vital Signs ED Triage Vitals [06/21/19 1539]  Enc Vitals Group     BP 136/79     Pulse Rate 91     Resp 18     Temp 98.6 F (37 C)     Temp src      SpO2 99 %     Weight      Height      Head Circumference      Peak Flow      Pain Score 7     Pain Loc      Pain Edu?      Excl. in [place]?    No data found.  Updated Vital Signs BP 136/79   Pulse 91   Temp 98.6 F (37 C)   Resp 18   SpO2 99%   Visual Acuity Right Eye Distance:   Left Eye Distance:   Bilateral Distance:    Right Eye Near:   Left Eye Near:    Bilateral Near:     Physical Exam Vitals signs and nursing note reviewed.  Constitutional:      General: She is not in acute distress.    Appearance: She is well-developed.  HENT:     Head: Normocephalic and atraumatic.  Eyes:     Conjunctiva/sclera: Conjunctivae normal.  Neck:     Musculoskeletal: Normal range of motion and neck supple.  Cardiovascular:     Rate and Rhythm: Normal rate and regular rhythm.     Heart sounds: No murmur.  Pulmonary:     Effort: Pulmonary effort is normal. No respiratory distress.     Breath sounds: Normal breath sounds.  Abdominal:     Palpations: Abdomen is soft.     Tenderness: There is no abdominal tenderness.  Skin:    General: Skin is warm and dry.  Neurological:     Mental Status: She is alert.      UC Treatments / Results  Labs (all labs ordered are listed, but only abnormal results are displayed) Labs Reviewed - No data to display  EKG   Radiology No results found.  Procedures Procedures (including critical care time)  Medications Ordered in UC Medications - No data to display  Initial Impression / Assessment and Plan / UC Course  I have reviewed  the triage vital signs and the nursing notes.   Pertinent labs & imaging results that were available during my care of the patient were reviewed by me and considered in my medical decision making (see chart for details).     48 year old African-American female with complaints of intermittent chest discomfort over the past couple days. No cardiac history Vital signs stable today and she is nontoxic or ill-appearing. EKG with normal sinus rhythm normal rate. Lungs clear and heart sounds normal No concerns for ACS or PE at this time. Patient has been referred to cardiologist for stress test by her doctor.  She does not currently have a scheduled date. I am not seeing any thing acute or worrisome to send to the ER at this time.  We will have her monitor symptoms and for worsening symptoms she will need to go the ER. Patient understanding and agree. Final Clinical Impressions(s) / UC Diagnoses   Final diagnoses:  Chest pain, unspecified type     Discharge Instructions     Your EKG was normal here today Call your doctor on Monday about the cardiology referral.  If your chest pain worsens or becomes more persistent then go to the ER     ED Prescriptions    None     PDMP not reviewed this encounter.   Orvan July, NP 06/22/19 1009

## 2019-06-22 NOTE — ED Notes (Signed)
Patient verbalized understanding of discharge instructions and denies any further needs or questions at this time. VS stable. Patient ambulatory with steady gait.  

## 2019-06-22 NOTE — ED Triage Notes (Signed)
Pt reporting central chest "tightness" since Thursday. Pt says she went to UC yesterday, the pain is worse. Denies cough, fevers.

## 2019-07-04 NOTE — Progress Notes (Signed)
Patient referred by Joyce Ebbs, MD for chest pain  Subjective:   Joyce Welch, female    DOB: 10-15-1970, 48 y.o.   MRN: [id number]   Chief Complaint  Patient presents with  . New Patient (Initial Visit)  . Chest Pain    CAD risk factors     HPI  48 y.o. African American female with obesity, asthma, hypothyroidism chest pain.  Patient works at [employer].  She is always on her feet at work.  For last 3 weeks, she has had episodes of sharp, gripping chest pain, lasting for few seconds to minutes.  Episodes typically happen at rest and wake her up from sleep at times.  Episodes also occur when she is working.  In her words, pain is unbearable and brings her to tears. She denies any exertional shortness of breath.  She is non-smoker, does not have any significant family history of coronary artery disease.   Past Medical History:  Diagnosis Date  . Asthma   . Chronic cough   . Fibroids   . GERD (gastroesophageal reflux disease)   . OAB (overactive bladder)      Past Surgical History:  Procedure Laterality Date  . ABDOMINAL HYSTERECTOMY  02/05/2006   partial  . [place], 2002, 2005,    x 3  . MAXILLARY ANTROSTOMY Right 10/07/2018   Procedure: RIGHT MAXILLARY ANTROSTOMY WITH TISSUE REMOVAL.;  Surgeon: Melida Quitter, MD;  Location: [place];  Service: ENT;  Laterality: Right;     Social History   Socioeconomic History  . Marital status: Single    Spouse name: Not on file  . Number of children: 3  . Years of education: 67  . Highest education level: High school graduate  Occupational History  . Occupation: Mudlogger   Social Needs  . Financial resource strain: Not on file  . Food insecurity    Worry: Not on file    Inability: Not on file  . Transportation needs    Medical: Not on file    Non-medical: Not on file  Tobacco Use  . Smoking status: Never Smoker  . Smokeless  tobacco: Never Used  Substance and Sexual Activity  . Alcohol use: Not Currently  . Drug use: No  . Sexual activity: Yes    Partners: Male    Birth control/protection: Surgical  Lifestyle  . Physical activity    Days per week: Not on file    Minutes per session: Not on file  . Stress: Not on file  Relationships  . Social Herbalist on phone: Not on file    Gets together: Not on file    Attends religious service: Not on file    Active member of club or organization: Not on file    Attends meetings of clubs or organizations: Not on file    Relationship status: Not on file  . Intimate partner violence    Fear of current or ex partner: Not on file    Emotionally abused: Not on file    Physically abused: Not on file    Forced sexual activity: Not on file  Other Topics Concern  . Not on file  Social History Narrative   Lives at home with her daughter.   Right-handed.   Caffeine use:  3 cups daily.     Family History  Problem Relation Age of Onset  . Hypertension Mother   . Other Father  unsure of medical history  . Breast cancer Maternal Aunt      Current Outpatient Medications on File Prior to Visit  Medication Sig Dispense Refill  . albuterol (PROVENTIL HFA;VENTOLIN HFA) 108 (90 BASE) MCG/ACT inhaler Inhale 1 puff into the lungs every 6 (six) hours as needed for wheezing or shortness of breath.     Marland Kitchen albuterol (PROVENTIL) (2.5 MG/3ML) 0.083% nebulizer solution Take 2.5 mg by nebulization every 6 (six) hours as needed for wheezing or shortness of breath.    Marland Kitchen azelastine (ASTELIN) 0.1 % nasal spray Place 1 spray into both nostrils 2 (two) times daily. Use in each nostril as directed (Patient taking differently: Place 1 spray into both nostrils See admin instructions. Instill 1 spray into each nostril two times a day as directed) 30 mL 12  . benzonatate (TESSALON) 100 MG capsule Take 1-2 capsules (100-200 mg total) by mouth 3 (three) times daily as needed. 60  capsule 0  . DULERA 100-5 MCG/ACT AERO INHALE 2 PUFFS INTO THE LUNGS TWICE DAILY 13 g 3  . fluticasone (FLONASE) 50 MCG/ACT nasal spray Place 2 sprays into both nostrils daily.     Marland Kitchen ibuprofen (ADVIL,MOTRIN) 600 MG tablet Take 1 tablet (600 mg total) by mouth every 6 (six) hours as needed. 30 tablet 0  . loratadine (CLARITIN) 10 MG tablet Take 10 mg by mouth daily.    . meloxicam (MOBIC) 7.5 MG tablet Take 1 tablet (7.5 mg total) by mouth daily. 30 tablet 0  . omeprazole (PRILOSEC) 40 MG capsule Take 1 capsule (40 mg total) by mouth daily. 30 capsule 1  . pantoprazole (PROTONIX) 20 MG tablet Take 1 tablet (20 mg total) by mouth daily. 30 tablet 0  . sucralfate (CARAFATE) 1 g tablet Take 1 tablet (1 g total) by mouth 4 (four) times daily. 30 tablet 0   No current facility-administered medications on file prior to visit.     Cardiovascular studies:  EKG 07/07/2019: Sinus rhythm 76 bpm. Normal EKG.  Echocardiogram 2015: 1. Left ventricle cavity is normal in size. Mild concentric hypertrophy of the left ventricle. Normal global wall motion. Normal syst. function, calculated EF is 64%. Normal diastolic function. 2. Normal mitral valve with mild regurgitation. 3. Normal tricuspid valve with trace regurgitation.  Recent labs: 01/09/2019: Glucose 68. BUN/Cr 13/0.88. eGFR 89. Na/K 141/4.5. Rest of the CMP is normal.  H/H 12.6/40. MCV 80. Platelets 269. Chol 182, TG 110, HDL 40, LDL 120. TSH normal.   Review of Systems  Constitution: Negative for decreased appetite, malaise/fatigue, weight gain and weight loss.  HENT: Negative for congestion.   Eyes: Negative for visual disturbance.  Cardiovascular: Positive for chest pain and leg swelling. Negative for dyspnea on exertion, palpitations and syncope.  Respiratory: Negative for cough.   Endocrine: Negative for cold intolerance.  Hematologic/Lymphatic: Does not bruise/bleed easily.  Skin: Negative for itching and rash.  Musculoskeletal:  Negative for myalgias.  Gastrointestinal: Negative for abdominal pain, nausea and vomiting.  Genitourinary: Negative for dysuria.  Neurological: Negative for dizziness and weakness.  Psychiatric/Behavioral: The patient is not nervous/anxious.   All other systems reviewed and are negative.        Vitals:   07/07/19 0845  BP: 127/79  Pulse: 64  Temp: (!) 96.8 F (36 C)  SpO2: 99%     Body mass index is 40.19 kg/m. Filed Weights   07/07/19 0845  Weight: 112.9 kg     Objective:   Physical Exam  Constitutional: She is oriented  to person, place, and time. She appears well-developed and well-nourished. No distress.  HENT:  Head: Normocephalic and atraumatic.  Eyes: Pupils are equal, round, and reactive to light. Conjunctivae are normal.  Neck: No JVD present.  Cardiovascular: Normal rate, regular rhythm and intact distal pulses.  Pulmonary/Chest: Effort normal and breath sounds normal. She has no wheezes. She has no rales.  Abdominal: Soft. Bowel sounds are normal. There is no rebound.  Musculoskeletal:        General: No edema (No significant leg edema.).  Lymphadenopathy:    She has no cervical adenopathy.  Neurological: She is alert and oriented to person, place, and time. No cranial nerve deficit.  Skin: Skin is warm and dry.  Psychiatric: She has a normal mood and affect.  Nursing note and vitals reviewed.         Assessment & Recommendations:   48 y.o. African American female with obesity, asthma, hypothyroidism chest pain.  Chest pain: Low suspicion for CAD.  Suspect GERD or musculoskeletal etiology.  Will obtain treadmill stress test. No significant negatives are noted on exam.  Structurally normal heart on echocardiogram in 2015.  Has not think she warrants repeat echocardiogram. Reviewed lipid panel.  Her ASCVD risk score is 5%.  She does not want statin therapy at this time.  Encouraged regular exercise and heart healthy diet.   I will see her on  as-needed basis, unless significant abnormalities found on treadmill stress test.  Thank you for referring the patient to [place]. Please feel free to contact with any questions.  Nigel Mormon, MD [facility] Pager: [phone] Office: [phone] If no answer Cell [phone]

## 2019-07-07 ENCOUNTER — Ambulatory Visit (INDEPENDENT_AMBULATORY_CARE_PROVIDER_SITE_OTHER): Payer: Medicaid Other | Admitting: Cardiology

## 2019-07-07 ENCOUNTER — Encounter: Payer: Self-pay | Admitting: Cardiology

## 2019-07-07 ENCOUNTER — Other Ambulatory Visit: Payer: Self-pay

## 2019-07-07 VITALS — BP 127/79 | HR 64 | Temp 96.8°F | Ht 66.0 in | Wt 249.0 lb

## 2019-07-07 DIAGNOSIS — R079 Chest pain, unspecified: Secondary | ICD-10-CM

## 2019-07-30 ENCOUNTER — Other Ambulatory Visit: Payer: Self-pay | Admitting: Cardiology

## 2019-07-30 DIAGNOSIS — R0789 Other chest pain: Secondary | ICD-10-CM

## 2019-08-04 ENCOUNTER — Ambulatory Visit (INDEPENDENT_AMBULATORY_CARE_PROVIDER_SITE_OTHER): Payer: Medicaid Other

## 2019-08-04 ENCOUNTER — Other Ambulatory Visit: Payer: Self-pay

## 2019-08-04 DIAGNOSIS — R0789 Other chest pain: Secondary | ICD-10-CM

## 2019-08-30 ENCOUNTER — Encounter (HOSPITAL_COMMUNITY): Payer: Self-pay | Admitting: *Deleted

## 2019-08-30 ENCOUNTER — Other Ambulatory Visit: Payer: Self-pay

## 2019-08-30 ENCOUNTER — Ambulatory Visit (HOSPITAL_COMMUNITY)
Admission: EM | Admit: 2019-08-30 | Discharge: 2019-08-30 | Disposition: A | Discharge status: Home / Self Care | Payer: Medicaid Other | Attending: Family Medicine | Admitting: Family Medicine

## 2019-08-30 DIAGNOSIS — J0100 Acute maxillary sinusitis, unspecified: Secondary | ICD-10-CM | POA: Diagnosis not present

## 2019-08-30 HISTORY — DX: Essential (primary) hypertension: I10

## 2019-08-30 MED ORDER — PREDNISONE 20 MG PO TABS: 40.0000 mg | ORAL_TABLET | Freq: Every day | ORAL | 0 refills | Status: AC

## 2019-08-30 NOTE — ED Provider Notes (Signed)
[place]    CSN: [id number] Arrival date & time: 08/30/19  1120  Chief Complaint  Patient presents with  . Edema    Joyce Welch is 48 y.o. female here for possible sinus infection.  Duration: 3 days Symptoms include: nasal congestion, clear rhinorrhea, sinus pain on L Denies: purulent rhinorrhea, cough, fevers Treatment to date: INCS, Astelin NS, Claritin  Sick contacts? No  ROS:  Const: Denies fevers HEENT: As noted in HPI  Past Medical History:  Diagnosis Date  . Asthma   . Chronic cough   . Fibroids   . GERD (gastroesophageal reflux disease)   . Hypertension   . OAB (overactive bladder)    BP 114/71   Pulse 81   Temp 98 F (36.7 C) (Oral)   Resp 16   SpO2 100%  Gen- Awake, alert, appears stated age 31- Ears patent, TM's neg b/l, nares are patent, L max sinus mildly ttp, MMM, pharynx is not erythematous or with exudate Heart- RRR Lungs- CTAB, no accessory muscle use MSK- mild ttp over L trap Psych- Age appropriate judgment and insight, nml mood and affect   Final Clinical Impressions(s) / UC Diagnoses   Final diagnoses:  Acute maxillary sinusitis, recurrence not specified     Discharge Instructions     Ice/cold pack over area for 10-15 min twice daily.  Heat (pad or rice pillow in microwave) over affected area, 10-15 minutes twice daily.   OK to take Tylenol 1000 mg (2 extra strength tabs) or 975 mg (3 regular strength tabs) every 6 hours as needed.  Trapezius stretches/exercises Do exercises exactly as told by your health care provider and adjust them as directed. It is normal to feel mild stretching, pulling, tightness, or discomfort as you do these exercises, but you should stop right away if you feel sudden pain or your pain gets worse.   Stretching and range of motion exercises These exercises warm up your muscles and joints and improve the movement and flexibility of your shoulder. These exercises can also help to  relieve pain, numbness, and tingling. If you are unable to do any of the following for any reason, do not further attempt to do it.   Exercise A: Flexion, standing     1. Stand and hold a broomstick, a cane, or a similar object. Place your hands a little more than shoulder-width apart on the object. Your left / right hand should be palm-up, and your other hand should be palm-down. 2. Push the stick to raise your left / right arm out to your side and then over your head. Use your other hand to help move the stick. Stop when you feel a stretch in your shoulder, or when you reach the angle that is recommended by your health care provider. ? Avoid shrugging your shoulder while you raise your arm. Keep your shoulder blade tucked down toward your spine. 3. Hold for 30 seconds. 4. Slowly return to the starting position. Repeat 2 times. Complete this exercise 3 times per week.  Exercise B: Abduction, supine     1. Lie on your back and hold a broomstick, a cane, or a similar object. Place your hands a little more than shoulder-width apart on the object. Your left / right hand should be palm-up, and your other hand should be palm-down. 2. Push the stick to raise your left / right arm out to your side and then over your head. Use your other hand to help move the  stick. Stop when you feel a stretch in your shoulder, or when you reach the angle that is recommended by your health care provider. ? Avoid shrugging your shoulder while you raise your arm. Keep your shoulder blade tucked down toward your spine. 3. Hold for 30 seconds. 4. Slowly return to the starting position. Repeat 2 times. Complete this exercise 3 times per week.  Exercise C: Flexion, active-assisted     1. Lie on your back. You may bend your knees for comfort. 2. Hold a broomstick, a cane, or a similar object. Place your hands about shoulder-width apart on the object. Your palms should face toward your feet. 3. Raise the stick and move  your arms over your head and behind your head, toward the floor. Use your healthy arm to help your left / right arm move farther. Stop when you feel a gentle stretch in your shoulder, or when you reach the angle where your health care provider tells you to stop. 4. Hold for 30 seconds. 5. Slowly return to the starting position. Repeat 2 times. Complete this exercise 3 times per week.  Exercise D: External rotation and abduction     1. Stand in a door frame with one of your feet slightly in front of the other. This is called a staggered stance. 2. Choose one of the following positions as told by your health care provider: ? Place your hands and forearms on the door frame above your head. ? Place your hands and forearms on the door frame at the height of your head. ? Place your hands on the door frame at the height of your elbows. 3. Slowly move your weight onto your front foot until you feel a stretch across your chest and in the front of your shoulders. Keep your head and chest upright and keep your abdominal muscles tight. 4. Hold for 30 seconds. 5. To release the stretch, shift your weight to your back foot. Repeat 2 times. Complete this stretch 3 times per week.  Strengthening exercises These exercises build strength and endurance in your shoulder. Endurance is the ability to use your muscles for a long time, even after your muscles get tired. Exercise E: Scapular depression and adduction  1. Sit on a stable chair. Support your arms in front of you with pillows, armrests, or a tabletop. Keep your elbows in line with the sides of your body. 2. Gently move your shoulder blades down toward your middle back. Relax the muscles on the tops of your shoulders and in the back of your neck. 3. Hold for 3 seconds. 4. Slowly release the tension and relax your muscles completely before doing this exercise again. Repeat for a total of 10 repetitions. 5. After you have practiced this exercise, try doing  the exercise without the arm support. Then, try the exercise while standing instead of sitting. Repeat 2 times. Complete this exercise 3 times per week.  Exercise F: Shoulder abduction, isometric     1. Stand or sit about 4-6 inches (10-15 cm) from a wall with your left / right side facing the wall. 2. Bend your left / right elbow and gently press your elbow against the wall. 3. Increase the pressure slowly until you are pressing as hard as you can without shrugging your shoulder. 4. Hold for 3 seconds. 5. Slowly release the tension and relax your muscles completely. Repeat for a total of 10 repetitions. Repeat 2 times. Complete this exercise 3 times per week.  Exercise G:  Shoulder flexion, isometric     1. Stand or sit about 4-6 inches (10-15 cm) away from a wall with your left / right side facing the wall. 2. Keep your left / right elbow straight and gently press the top of your fist against the wall. Increase the pressure slowly until you are pressing as hard as you can without shrugging your shoulder. 3. Hold for 10-15 seconds. 4. Slowly release the tension and relax your muscles completely. Repeat for a total of 10 repetitions. Repeat 2 times. Complete this exercise 3 times per week.  Exercise H: Internal rotation     1. Sit in a stable chair without armrests, or stand. Secure an exercise band at your left / right side, at elbow height. 2. Place a soft object, such as a folded towel or a small pillow, under your left / right upper arm so your elbow is a few inches (about 8 cm) away from your side. 3. Hold the end of the exercise band so the band stretches. 4. Keeping your elbow pressed against the soft object under your arm, move your forearm across your body toward your abdomen. Keep your body steady so the movement is only coming from your shoulder. 5. Hold for 3 seconds. 6. Slowly return to the starting position. Repeat for a total of 10 repetitions. Repeat 2 times. Complete  this exercise 3 times per week.  Exercise I: External rotation     1. Sit in a stable chair without armrests, or stand. 2. Secure an exercise band at your left / right side, at elbow height. 3. Place a soft object, such as a folded towel or a small pillow, under your left / right upper arm so your elbow is a few inches (about 8 cm) away from your side. 4. Hold the end of the exercise band so the band stretches. 5. Keeping your elbow pressed against the soft object under your arm, move your forearm out, away from your abdomen. Keep your body steady so the movement is only coming from your shoulder. 6. Hold for 3 seconds. 7. Slowly return to the starting position. Repeat for a total of 10 repetitions. Repeat 2 times. Complete this exercise 3 times per week. Exercise J: Shoulder extension  1. Sit in a stable chair without armrests, or stand. Secure an exercise band to a stable object in front of you so the band is at shoulder height. 2. Hold one end of the exercise band in each hand. Your palms should face each other. 3. Straighten your elbows and lift your hands up to shoulder height. 4. Step back, away from the secured end of the exercise band, until the band stretches. 5. Squeeze your shoulder blades together and pull your hands down to the sides of your thighs. Stop when your hands are straight down by your sides. Do not let your hands go behind your body. 6. Hold for 3 seconds. 7. Slowly return to the starting position. Repeat for a total of 10 repetitions. Repeat 2 times. Complete this exercise 3 times per week.  Exercise K: Shoulder extension, prone     1. Lie on your abdomen on a firm surface so your left / right arm hangs over the edge. 2. Hold a 5 lb weight in your hand so your palm faces in toward your body. Your arm should be straight. 3. Squeeze your shoulder blade down toward the middle of your back. 4. Slowly raise your arm behind you, up to the height  of the surface that you  are lying on. Keep your arm straight. 5. Hold for 3 seconds. 6. Slowly return to the starting position and relax your muscles. Repeat for a total of 10 repetitions. Repeat 2 times. Complete this exercise 3 times per week.   Exercise L: Horizontal abduction, prone  1. Lie on your abdomen on a firm surface so your left / right arm hangs over the edge. 2. Hold a 5 lb weight in your hand so your palm faces toward your feet. Your arm should be straight. 3. Squeeze your shoulder blade down toward the middle of your back. 4. Bend your elbow so your hand moves up, until your elbow is bent to an "L" shape (90 degrees). With your elbow bent, slowly move your forearm forward and up. Raise your hand up to the height of the surface that you are lying on. ? Your upper arm should not move, and your elbow should stay bent. ? At the top of the movement, your palm should face the floor. 5. Hold for 3 seconds. 6. Slowly return to the starting position and relax your muscles. Repeat for a total of 10 repetitions. Repeat 2 times. Complete this exercise 3 times per week.  Exercise M: Horizontal abduction, standing  1. Sit on a stable chair, or stand. 2. Secure an exercise band to a stable object in front of you so the band is at shoulder height. 3. Hold one end of the exercise band in each hand. 4. Straighten your elbows and lift your hands straight in front of you, up to shoulder height. Your palms should face down, toward the floor. 5. Step back, away from the secured end of the exercise band, until the band stretches. 6. Move your arms out to your sides, and keep your arms straight. 7. Hold for 3 seconds. 8. Slowly return to the starting position. Repeat for a total of 10 repetitions. Repeat 2 times. Complete this exercise 3 times per week.  Exercise N: Scapular retraction and elevation  1. Sit on a stable chair, or stand. 2. Secure an exercise band to a stable object in front of you so the band is at  shoulder height. 3. Hold one end of the exercise band in each hand. Your palms should face each other. 4. Sit in a stable chair without armrests, or stand. 5. Step back, away from the secured end of the exercise band, until the band stretches. 6. Squeeze your shoulder blades together and lift your hands over your head. Keep your elbows straight. 7. Hold for 3 seconds. 8. Slowly return to the starting position. Repeat for a total of 10 repetitions. Repeat 2 times. Complete this exercise 3 times per week.  This information is not intended to replace advice given to you by your health care provider. Make sure you discuss any questions you have with your health care provider. Document Released: 09/18/2005 Document Revised: 05/25/2016 Document Reviewed: 08/05/2015 Elsevier Interactive Patient Education  2017 [employer].     ED Prescriptions    Medication Sig Dispense Auth. Provider   predniSONE (DELTASONE) 20 MG tablet Take 2 tablets (40 mg total) by mouth daily with breakfast for 5 days. 10 tablet Shelda Pal, DO     PDMP not reviewed this encounter.   Shelda Pal, DO 08/30/19 1401

## 2019-08-30 NOTE — ED Triage Notes (Signed)
Pt reports swelling to left side of face and left lateral neck and medial shoulder swelling x 2 days.  Denies fevers.  C/O left side runny nose x 2 days.  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

## 2019-08-30 NOTE — Discharge Instructions (Signed)
Ice/cold pack over area for 10-15 min twice daily.  Heat (pad or rice pillow in microwave) over affected area, 10-15 minutes twice daily.   OK to take Tylenol 1000 mg (2 extra strength tabs) or 975 mg (3 regular strength tabs) every 6 hours as needed.  Trapezius stretches/exercises Do exercises exactly as told by your health care provider and adjust them as directed. It is normal to feel mild stretching, pulling, tightness, or discomfort as you do these exercises, but you should stop right away if you feel sudden pain or your pain gets worse.   Stretching and range of motion exercises These exercises warm up your muscles and joints and improve the movement and flexibility of your shoulder. These exercises can also help to relieve pain, numbness, and tingling. If you are unable to do any of the following for any reason, do not further attempt to do it.   Exercise A: Flexion, standing     Stand and hold a broomstick, a cane, or a similar object. Place your hands a little more than shoulder-width apart on the object. Your left / right hand should be palm-up, and your other hand should be palm-down. Push the stick to raise your left / right arm out to your side and then over your head. Use your other hand to help move the stick. Stop when you feel a stretch in your shoulder, or when you reach the angle that is recommended by your health care provider. Avoid shrugging your shoulder while you raise your arm. Keep your shoulder blade tucked down toward your spine. Hold for 30 seconds. Slowly return to the starting position. Repeat 2 times. Complete this exercise 3 times per week.  Exercise B: Abduction, supine     Lie on your back and hold a broomstick, a cane, or a similar object. Place your hands a little more than shoulder-width apart on the object. Your left / right hand should be palm-up, and your other hand should be palm-down. Push the stick to raise your left / right arm out to your side  and then over your head. Use your other hand to help move the stick. Stop when you feel a stretch in your shoulder, or when you reach the angle that is recommended by your health care provider. Avoid shrugging your shoulder while you raise your arm. Keep your shoulder blade tucked down toward your spine. Hold for 30 seconds. Slowly return to the starting position. Repeat 2 times. Complete this exercise 3 times per week.  Exercise C: Flexion, active-assisted     Lie on your back. You may bend your knees for comfort. Hold a broomstick, a cane, or a similar object. Place your hands about shoulder-width apart on the object. Your palms should face toward your feet. Raise the stick and move your arms over your head and behind your head, toward the floor. Use your healthy arm to help your left / right arm move farther. Stop when you feel a gentle stretch in your shoulder, or when you reach the angle where your health care provider tells you to stop. Hold for 30 seconds. Slowly return to the starting position. Repeat 2 times. Complete this exercise 3 times per week.  Exercise D: External rotation and abduction     Stand in a door frame with one of your feet slightly in front of the other. This is called a staggered stance. Choose one of the following positions as told by your health care provider: Place your hands  and forearms on the door frame above your head. Place your hands and forearms on the door frame at the height of your head. Place your hands on the door frame at the height of your elbows. Slowly move your weight onto your front foot until you feel a stretch across your chest and in the front of your shoulders. Keep your head and chest upright and keep your abdominal muscles tight. Hold for 30 seconds. To release the stretch, shift your weight to your back foot. Repeat 2 times. Complete this stretch 3 times per week.  Strengthening exercises These exercises build strength and  endurance in your shoulder. Endurance is the ability to use your muscles for a long time, even after your muscles get tired. Exercise E: Scapular depression and adduction  Sit on a stable chair. Support your arms in front of you with pillows, armrests, or a tabletop. Keep your elbows in line with the sides of your body. Gently move your shoulder blades down toward your middle back. Relax the muscles on the tops of your shoulders and in the back of your neck. Hold for 3 seconds. Slowly release the tension and relax your muscles completely before doing this exercise again. Repeat for a total of 10 repetitions. After you have practiced this exercise, try doing the exercise without the arm support. Then, try the exercise while standing instead of sitting. Repeat 2 times. Complete this exercise 3 times per week.  Exercise F: Shoulder abduction, isometric     Stand or sit about 4-6 inches (10-15 cm) from a wall with your left / right side facing the wall. Bend your left / right elbow and gently press your elbow against the wall. Increase the pressure slowly until you are pressing as hard as you can without shrugging your shoulder. Hold for 3 seconds. Slowly release the tension and relax your muscles completely. Repeat for a total of 10 repetitions. Repeat 2 times. Complete this exercise 3 times per week.  Exercise G: Shoulder flexion, isometric     Stand or sit about 4-6 inches (10-15 cm) away from a wall with your left / right side facing the wall. Keep your left / right elbow straight and gently press the top of your fist against the wall. Increase the pressure slowly until you are pressing as hard as you can without shrugging your shoulder. Hold for 10-15 seconds. Slowly release the tension and relax your muscles completely. Repeat for a total of 10 repetitions. Repeat 2 times. Complete this exercise 3 times per week.  Exercise H: Internal rotation     Sit in a stable chair without  armrests, or stand. Secure an exercise band at your left / right side, at elbow height. Place a soft object, such as a folded towel or a small pillow, under your left / right upper arm so your elbow is a few inches (about 8 cm) away from your side. Hold the end of the exercise band so the band stretches. Keeping your elbow pressed against the soft object under your arm, move your forearm across your body toward your abdomen. Keep your body steady so the movement is only coming from your shoulder. Hold for 3 seconds. Slowly return to the starting position. Repeat for a total of 10 repetitions. Repeat 2 times. Complete this exercise 3 times per week.  Exercise I: External rotation     Sit in a stable chair without armrests, or stand. Secure an exercise band at your left / right side,  at elbow height. Place a soft object, such as a folded towel or a small pillow, under your left / right upper arm so your elbow is a few inches (about 8 cm) away from your side. Hold the end of the exercise band so the band stretches. Keeping your elbow pressed against the soft object under your arm, move your forearm out, away from your abdomen. Keep your body steady so the movement is only coming from your shoulder. Hold for 3 seconds. Slowly return to the starting position. Repeat for a total of 10 repetitions. Repeat 2 times. Complete this exercise 3 times per week. Exercise J: Shoulder extension  Sit in a stable chair without armrests, or stand. Secure an exercise band to a stable object in front of you so the band is at shoulder height. Hold one end of the exercise band in each hand. Your palms should face each other. Straighten your elbows and lift your hands up to shoulder height. Step back, away from the secured end of the exercise band, until the band stretches. Squeeze your shoulder blades together and pull your hands down to the sides of your thighs. Stop when your hands are straight down by your sides.  Do not let your hands go behind your body. Hold for 3 seconds. Slowly return to the starting position. Repeat for a total of 10 repetitions. Repeat 2 times. Complete this exercise 3 times per week.  Exercise K: Shoulder extension, prone     Lie on your abdomen on a firm surface so your left / right arm hangs over the edge. Hold a 5 lb weight in your hand so your palm faces in toward your body. Your arm should be straight. Squeeze your shoulder blade down toward the middle of your back. Slowly raise your arm behind you, up to the height of the surface that you are lying on. Keep your arm straight. Hold for 3 seconds. Slowly return to the starting position and relax your muscles. Repeat for a total of 10 repetitions. Repeat 2 times. Complete this exercise 3 times per week.   Exercise L: Horizontal abduction, prone  Lie on your abdomen on a firm surface so your left / right arm hangs over the edge. Hold a 5 lb weight in your hand so your palm faces toward your feet. Your arm should be straight. Squeeze your shoulder blade down toward the middle of your back. Bend your elbow so your hand moves up, until your elbow is bent to an "L" shape (90 degrees). With your elbow bent, slowly move your forearm forward and up. Raise your hand up to the height of the surface that you are lying on. Your upper arm should not move, and your elbow should stay bent. At the top of the movement, your palm should face the floor. Hold for 3 seconds. Slowly return to the starting position and relax your muscles. Repeat for a total of 10 repetitions. Repeat 2 times. Complete this exercise 3 times per week.  Exercise M: Horizontal abduction, standing  Sit on a stable chair, or stand. Secure an exercise band to a stable object in front of you so the band is at shoulder height. Hold one end of the exercise band in each hand. Straighten your elbows and lift your hands straight in front of you, up to shoulder height.  Your palms should face down, toward the floor. Step back, away from the secured end of the exercise band, until the band stretches. Move your  arms out to your sides, and keep your arms straight. Hold for 3 seconds. Slowly return to the starting position. Repeat for a total of 10 repetitions. Repeat 2 times. Complete this exercise 3 times per week.  Exercise N: Scapular retraction and elevation  Sit on a stable chair, or stand. Secure an exercise band to a stable object in front of you so the band is at shoulder height. Hold one end of the exercise band in each hand. Your palms should face each other. Sit in a stable chair without armrests, or stand. Step back, away from the secured end of the exercise band, until the band stretches. Squeeze your shoulder blades together and lift your hands over your head. Keep your elbows straight. Hold for 3 seconds. Slowly return to the starting position. Repeat for a total of 10 repetitions. Repeat 2 times. Complete this exercise 3 times per week.  This information is not intended to replace advice given to you by your health care provider. Make sure you discuss any questions you have with your health care provider. Document Released: 09/18/2005 Document Revised: 05/25/2016 Document Reviewed: 08/05/2015 Elsevier Interactive Patient Education  2017 [employer].

## 2019-09-30 ENCOUNTER — Ambulatory Visit (HOSPITAL_COMMUNITY)
Admission: EM | Admit: 2019-09-30 | Discharge: 2019-09-30 | Disposition: A | Discharge status: Home / Self Care | Payer: Medicaid Other | Attending: Family Medicine | Admitting: Family Medicine

## 2019-09-30 ENCOUNTER — Other Ambulatory Visit: Payer: Self-pay

## 2019-09-30 ENCOUNTER — Encounter (HOSPITAL_COMMUNITY): Payer: Self-pay | Admitting: Emergency Medicine

## 2019-09-30 DIAGNOSIS — M7062 Trochanteric bursitis, left hip: Secondary | ICD-10-CM

## 2019-09-30 MED ORDER — DICLOFENAC SODIUM 75 MG PO TBEC: 75.0000 mg | DELAYED_RELEASE_TABLET | Freq: Two times a day (BID) | ORAL | 0 refills | Status: DC

## 2019-09-30 MED ORDER — PREDNISONE 10 MG (21) PO TBPK: ORAL_TABLET | Freq: Every day | ORAL | 0 refills | Status: DC

## 2019-09-30 NOTE — ED Provider Notes (Signed)
[place]   [id number] 09/30/19 Arrival Time: 0915  ASSESSMENT & PLAN:  1. Trochanteric bursitis of left hip     No indications for imaging at this time. Normal neurological exam of LE.   Meds ordered this encounter  Medications  . predniSONE (STERAPRED UNI-PAK 21 TAB) 10 MG (21) TBPK tablet    Sig: Take by mouth daily. Take as directed.    Dispense:  21 tablet    Refill:  0  . diclofenac (VOLTAREN) 75 MG EC tablet    Sig: Take 1 tablet (75 mg total) by mouth 2 (two) times daily.    Dispense:  14 tablet    Refill:  0    Encouraged ROM as she tolerates.  Recommend: Follow-up Information    [facility].   Why: If worsening or failing to improve as anticipated. Contact information: [address] [place] [id number]          Reviewed expectations re: course of current medical issues. Questions answered. Outlined signs and symptoms indicating need for more acute intervention. Patient verbalized understanding. After Visit Summary given.  SUBJECTIVE: History from: patient. Joyce Welch is a 48 y.o. female who reports fairly persistent marked pain of her left "hip area"; described as aching and dull; questions radiation "deeper into my leg". No specific back or buttock pain reported. Onset: gradual. First noted: 2 w ago. Injury/trama: no. Symptoms have waxed and waned but are worse overall since beginning. Aggravating factors: certain movements, weight bearing and prolonged walking/standing. Alleviating factors: have not been identified. Associated symptoms: none reported. Extremity sensation changes or weakness: none. Self treatment: acetaminophen, with no relief.  History of similar: no. Reports normal bowel/bladder habits. Normal PO intake without n/v/d. No abdominal pain.   Past Surgical History:  Procedure Laterality Date  . ABDOMINAL HYSTERECTOMY  02/05/2006   partial  .  [place], 2002, 2005,    x 3  . MAXILLARY ANTROSTOMY Right 10/07/2018   Procedure: RIGHT MAXILLARY ANTROSTOMY WITH TISSUE REMOVAL.;  Surgeon: Melida Quitter, MD;  Location: [facility];  Service: ENT;  Laterality: Right;     ROS: As per HPI. All other systems negative.    OBJECTIVE:  Vitals:   09/30/19 1028  BP: 127/81  Pulse: 91  Resp: 16  Temp: 98.3 F (36.8 C)  TempSrc: Oral  SpO2: 100%    General appearance: alert; no distress HEENT: [place]; AT Neck: supple with FROM Resp: unlabored respirations Extremities: . LLE: obese; warm with well perfused appearance; poorly localized moderate tenderness over left upper lateral thigh over trochanteric bursa; without gross deformities; swelling: none; bruising: none; hip ROM: difficult to assess given reported pain CV: brisk extremity capillary refill of LLE; 2+ DP pulse of LLE. Skin: warm and dry; no visible rashes Neurologic: gait normal but favors LLE; normal reflexes of LLE; normal distal sensation of LLE; normal strength of LLE Psychological: alert and cooperative; normal mood and affect    No Known Allergies  Past Medical History:  Diagnosis Date  . Asthma   . Chronic cough   . Fibroids   . GERD (gastroesophageal reflux disease)   . Hypertension   . OAB (overactive bladder)    Social History   Socioeconomic History  . Marital status: Single    Spouse name: Not on file  . Number of children: 3  . Years of education: 75  . Highest education level: High school graduate  Occupational History  . 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 Smoking status: Never Smoker  . Smokeless tobacco: Never Used  Substance and Sexual Activity  . Alcohol use: Yes    Comment: socially  . Drug use: Never  . Sexual activity: Not on file  Other Topics Concern  . Not on file  Social History Narrative   Lives at home with her daughter.   Right-handed.   Caffeine use:  3 cups daily.   Social  Determinants of Health   Financial Resource Strain:   . Difficulty of Paying Living Expenses: Not on file  Food Insecurity:   . Worried About Charity fundraiser in the Last Year: Not on file  . Ran Out of Food in the Last Year: Not on file  Transportation Needs:   . Lack of Transportation (Medical): Not on file  . Lack of Transportation (Non-Medical): Not on file  Physical Activity:   . Days of Exercise per Week: Not on file  . Minutes of Exercise per Session: Not on file  Stress:   . Feeling of Stress : Not on file  Social Connections:   . Frequency of Communication with Friends and Family: Not on file  . Frequency of Social Gatherings with Friends and Family: Not on file  . Attends Religious Services: Not on file  . Active Member of Clubs or Organizations: Not on file  . Attends Archivist Meetings: Not on file  . Marital Status: Not on file   Family History  Problem Relation Age of Onset  . Hypertension Mother   . Other Father        unsure of medical history  . Breast cancer Maternal Aunt    Past Surgical History:  Procedure Laterality Date  . ABDOMINAL HYSTERECTOMY  02/05/2006   partial  . [place], 2002, 2005,    x 3  . MAXILLARY ANTROSTOMY Right 10/07/2018   Procedure: RIGHT MAXILLARY ANTROSTOMY WITH TISSUE REMOVAL.;  Surgeon: Melida Quitter, MD;  Location: [facility];  Service: ENT;  Laterality: Right;      Vanessa Kick, MD 09/30/19 1145

## 2019-09-30 NOTE — ED Triage Notes (Signed)
Left sided back pain with shooting pain down left leg for 2 weeks.

## 2019-10-06 ENCOUNTER — Encounter: Payer: Self-pay | Admitting: Family Medicine

## 2019-10-06 ENCOUNTER — Ambulatory Visit: Payer: Medicaid Other | Admitting: Family Medicine

## 2019-10-06 ENCOUNTER — Other Ambulatory Visit: Payer: Self-pay

## 2019-10-06 VITALS — BP 140/80 | Ht 66.0 in | Wt 249.0 lb

## 2019-10-06 DIAGNOSIS — M25552 Pain in left hip: Secondary | ICD-10-CM

## 2019-10-06 MED ORDER — DICLOFENAC SODIUM 75 MG PO TBEC: 75.0000 mg | DELAYED_RELEASE_TABLET | Freq: Two times a day (BID) | ORAL | 1 refills | Status: DC

## 2019-10-06 NOTE — Patient Instructions (Signed)
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 Avoid painful activities as much as possible. Ice over area of pain 3-4 times a day for 15 minutes at a time Hip side raise exercise 3 sets of 10 once a day - add weights if this becomes too easy. Stretches - pick 2-3 and hold for 20-30 seconds x 3 - do once or twice a day. Voltaren 75mg  twice a day with food. Tylenol 500mg  1-2 tabs three times a day as needed for pain. Topical biofreeze, capsaicin up to 4 times a day may be helpful as well If not improving, can consider physical therapy and/or steroid injection. Follow up with me in 1 month but call me sooner if you want to do either of these.

## 2019-10-06 NOTE — Progress Notes (Signed)
PCP: [name], MD  Subjective:   HPI: Patient is a 49 y.o. female here for left hip pain.  Patient reports she's had about 3 weeks of lateral left hip pain. Worse lying on her left side. Radiates into groin and down lateral left leg. Pain was up to 10/10 and sharp - better since she took prednisone dose pack from urgent care but still at a 5-6/10 level. No skin changes, obvious swelling. No injuries preceding this.  Past Medical History:  Diagnosis Date  . Asthma   . Chronic cough   . Fibroids   . GERD (gastroesophageal reflux disease)   . Hypertension   . OAB (overactive bladder)     Current Outpatient Medications on File Prior to Visit  Medication Sig Dispense Refill  . albuterol (PROVENTIL HFA;VENTOLIN HFA) 108 (90 BASE) MCG/ACT inhaler Inhale 1 puff into the lungs every 6 (six) hours as needed for wheezing or shortness of breath.     Marland Kitchen albuterol (PROVENTIL) (2.5 MG/3ML) 0.083% nebulizer solution Take 2.5 mg by nebulization every 6 (six) hours as needed for wheezing or shortness of breath.    Marland Kitchen amLODipine (NORVASC) 5 MG tablet Take 5 mg by mouth daily.    Marland Kitchen azelastine (ASTELIN) 0.1 % nasal spray Place 1 spray into both nostrils 2 (two) times daily. Use in each nostril as directed (Patient taking differently: Place 1 spray into both nostrils See admin instructions. Instill 1 spray into each nostril two times a day as directed) 30 mL 12  . DULERA 100-5 MCG/ACT AERO INHALE 2 PUFFS INTO THE LUNGS TWICE DAILY 13 g 3  . fluticasone (FLONASE) 50 MCG/ACT nasal spray Place 2 sprays into both nostrils daily.     Marland Kitchen loratadine (CLARITIN) 10 MG tablet Take 10 mg by mouth daily.    . sucralfate (CARAFATE) 1 g tablet Take 1 tablet (1 g total) by mouth 4 (four) times daily. (Patient taking differently: Take 1 g by mouth 3 (three) times daily. ) 30 tablet 0  . tiZANidine (ZANAFLEX) 4 MG tablet 2 (two) times daily.    . Vitamin D, Ergocalciferol, (DRISDOL) 1.25 MG (50000 UT) CAPS capsule  Take 50,000 Units by mouth once a week.    . pantoprazole (PROTONIX) 40 MG tablet Take 40 mg by mouth every morning.    . predniSONE (STERAPRED UNI-PAK 21 TAB) 10 MG (21) TBPK tablet Take by mouth daily. Take as directed. (Patient not taking: Reported on 10/06/2019) 21 tablet 0   No current facility-administered medications on file prior to visit.    Past Surgical History:  Procedure Laterality Date  . ABDOMINAL HYSTERECTOMY  02/05/2006   partial  . [place], 2002, 2005,    x 3  . MAXILLARY ANTROSTOMY Right 10/07/2018   Procedure: RIGHT MAXILLARY ANTROSTOMY WITH TISSUE REMOVAL.;  Surgeon: Melida Quitter, MD;  Location: [facility];  Service: ENT;  Laterality: Right;    No Known Allergies  Social History   Socioeconomic History  . Marital status: Single    Spouse name: Not on file  . Number of children: 3  . Years of education: 63  . Highest education level: High school graduate  Occupational History  . 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 Smoking status: Never Smoker  . Smokeless tobacco: Never Used  Substance and Sexual Activity  . Alcohol use: Yes    Comment: socially  . Drug use: Never  . Sexual activity: Not on file  Other Topics Concern  .  Not on file  Social History Narrative   Lives at home with her daughter.   Right-handed.   Caffeine use:  3 cups daily.   Social Determinants of Health   Financial Resource Strain:   . Difficulty of Paying Living Expenses: Not on file  Food Insecurity:   . Worried About Charity fundraiser in the Last Year: Not on file  . Ran Out of Food in the Last Year: Not on file  Transportation Needs:   . Lack of Transportation (Medical): Not on file  . Lack of Transportation (Non-Medical): Not on file  Physical Activity:   . Days of Exercise per Week: Not on file  . Minutes of Exercise per Session: Not on file  Stress:   . Feeling of Stress : Not on file  Social Connections:   . Frequency of  Communication with Friends and Family: Not on file  . Frequency of Social Gatherings with Friends and Family: Not on file  . Attends Religious Services: Not on file  . Active Member of Clubs or Organizations: Not on file  . Attends Archivist Meetings: Not on file  . Marital Status: Not on file  Intimate Partner Violence:   . Fear of Current or Ex-Partner: Not on file  . Emotionally Abused: Not on file  . Physically Abused: Not on file  . Sexually Abused: Not on file    Family History  Problem Relation Age of Onset  . Hypertension Mother   . Other Father        unsure of medical history  . Breast cancer Maternal Aunt     BP 140/80   Ht 5\' 6"  (1.676 m)   Wt 249 lb (112.9 kg)   BMI 40.19 kg/m   Review of Systems: See HPI above.     Objective:  Physical Exam:  Gen: NAD, comfortable in exam room  Back: No gross deformity, scoliosis. TTP mildly bilateral paraspinal lumbar regions.  No midline or bony TTP. Strength LEs 5/5 all muscle groups except left hip abduction.   Negative SLRs. Sensation intact to light touch bilaterally.  Left hip: No deformity. FROM with 5/5 strength except 4/5 with left hip abduction and mild pain. TTP over greater trochanter reproducing her pain. NVI distally. Negative logroll.  Negative faber and fadir.   Assessment & Plan:  1. 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  Discussed options - she declined injection and PT for now but will consider if she doesn't improve over next couple weeks.  Shown home exercises and stretches to do daily.  Voltaren twice a day with food.  Tylenol, topical medications as needed.  F/u in 1 month.

## 2019-10-08 ENCOUNTER — Other Ambulatory Visit: Payer: Self-pay

## 2019-10-08 ENCOUNTER — Encounter: Payer: Self-pay | Admitting: Emergency Medicine

## 2019-10-08 ENCOUNTER — Ambulatory Visit
Admission: EM | Admit: 2019-10-08 | Discharge: 2019-10-08 | Disposition: A | Discharge status: Home / Self Care | Payer: Medicaid Other | Attending: Emergency Medicine | Admitting: Emergency Medicine

## 2019-10-08 DIAGNOSIS — Z20822 Contact with and (suspected) exposure to covid-19: Secondary | ICD-10-CM | POA: Diagnosis not present

## 2019-10-08 NOTE — ED Provider Notes (Signed)
[name]    CSN: [id number] Arrival date & time: 10/08/19  1900      History   Chief Complaint Chief Complaint  Patient presents with  . Exposure to COVID    HPI Joyce Welch is a 49 y.o. female with history of hypertension, asthma, obesity  Presenting for Covid testing: Exposure: brother who works in nursing home, tested positive Date of exposure: 10/02/19 Any fever, symptoms since exposure: none   Past Medical History:  Diagnosis Date  . Asthma   . Chronic cough   . Fibroids   . GERD (gastroesophageal reflux disease)   . Hypertension   . OAB (overactive bladder)     Patient Active Problem List   Diagnosis Date Noted  . Paresthesia 10/17/2018  . Weakness 10/17/2018  . Cough 06/20/2018  . Post-nasal drip 06/20/2018  . Globus sensation 06/20/2018  . Asthma exacerbation 10/31/2017  . Leukocytosis 10/31/2017  . Strain of quadriceps tendon 02/14/2014  . Symptomatic menopausal or female climacteric states 07/31/2013    Past Surgical History:  Procedure Laterality Date  . ABDOMINAL HYSTERECTOMY  02/05/2006   partial  . [place], 2002, 2005,    x 3  . MAXILLARY ANTROSTOMY Right 10/07/2018   Procedure: RIGHT MAXILLARY ANTROSTOMY WITH TISSUE REMOVAL.;  Surgeon: Melida Quitter, MD;  Location: [facility];  Service: ENT;  Laterality: Right;    OB History    Gravida  3   Para  3   Term  1   Preterm      AB      Living  3     SAB      TAB      Ectopic      Multiple      Live Births  3            Home Medications    Prior to Admission medications   Medication Sig Start Date End Date Taking? Authorizing Provider  albuterol (PROVENTIL HFA;VENTOLIN HFA) 108 (90 BASE) MCG/ACT inhaler Inhale 1 puff into the lungs every 6 (six) hours as needed for wheezing or shortness of breath.     [provider]  albuterol (PROVENTIL) (2.5 MG/3ML) 0.083% nebulizer solution Take 2.5 mg by nebulization  every 6 (six) hours as needed for wheezing or shortness of breath.    [provider]  amLODipine (NORVASC) 5 MG tablet Take 5 mg by mouth daily.    [provider]  azelastine (ASTELIN) 0.1 % nasal spray Place 1 spray into both nostrils 2 (two) times daily. Use in each nostril as directed Patient taking differently: Place 1 spray into both nostrils See admin instructions. Instill 1 spray into each nostril two times a day as directed 06/04/18   [name], MD  diclofenac (VOLTAREN) 75 MG EC tablet Take 1 tablet (75 mg total) by mouth 2 (two) times daily. 10/06/19   Dene Gentry, MD  DULERA 100-5 MCG/ACT AERO INHALE 2 PUFFS INTO THE LUNGS TWICE DAILY 11/15/18   Fenton Foy, NP  fluticasone (FLONASE) 50 MCG/ACT nasal spray Place 2 sprays into both nostrils daily.     [provider]  loratadine (CLARITIN) 10 MG tablet Take 10 mg by mouth daily.    [provider]  pantoprazole (PROTONIX) 40 MG tablet Take 40 mg by mouth every morning. 09/07/19   [provider]  predniSONE (STERAPRED UNI-PAK 21 TAB) 10 MG (21) TBPK tablet Take by mouth daily. Take as directed.  Patient not taking: Reported on 10/06/2019 09/30/19   Vanessa Kick, MD  sucralfate (CARAFATE) 1 g tablet Take 1 tablet (1 g total) by mouth 4 (four) times daily. Patient taking differently: Take 1 g by mouth 3 (three) times daily.  06/22/19   Lacretia Leigh, MD  tiZANidine (ZANAFLEX) 4 MG tablet 2 (two) times daily. 05/20/19   [provider]  Vitamin D, Ergocalciferol, (DRISDOL) 1.25 MG (50000 UT) CAPS capsule Take 50,000 Units by mouth once a week. 05/16/19   [provider]    Family History Family History  Problem Relation Age of Onset  . Hypertension Mother   . Other Father        unsure of medical history  . Breast cancer Maternal Aunt     Social History Social History   Tobacco Use  . Smoking status: Never Smoker  . Smokeless tobacco: Never Used  Substance Use  Topics  . Alcohol use: Yes    Comment: socially  . Drug use: Never     Allergies   Patient has no known allergies.   Review of Systems Review of Systems  Constitutional: Negative for fatigue and fever.  HENT: Negative for ear pain, sinus pain, sore throat and voice change.   Eyes: Negative for pain, redness and visual disturbance.  Respiratory: Negative for cough and shortness of breath.   Cardiovascular: Negative for chest pain and palpitations.  Gastrointestinal: Negative for abdominal pain, diarrhea and vomiting.  Musculoskeletal: Negative for arthralgias and myalgias.  Skin: Negative for rash and wound.  Neurological: Negative for syncope and headaches.     Physical Exam Triage Vital Signs ED Triage Vitals  Enc Vitals Group     BP      Pulse      Resp      Temp      Temp src      SpO2      Weight      Height      Head Circumference      Peak Flow      Pain Score      Pain Loc      Pain Edu?      Excl. in [place]?    No data found.  Updated Vital Signs BP (!) 151/84 (BP Location: Right Arm)   Pulse 100   Temp 97.8 F (36.6 C) (Temporal)   Resp 16   SpO2 99%   Visual Acuity Right Eye Distance:   Left Eye Distance:   Bilateral Distance:    Right Eye Near:   Left Eye Near:    Bilateral Near:     Physical Exam Constitutional:      General: She is not in acute distress.    Appearance: She is obese. She is not ill-appearing or diaphoretic.  HENT:     Head: Normocephalic and atraumatic.  Eyes:     General: No scleral icterus.    Pupils: Pupils are equal, round, and reactive to light.  Cardiovascular:     Rate and Rhythm: Normal rate.  Pulmonary:     Effort: Pulmonary effort is normal. No respiratory distress.     Breath sounds: No wheezing.  Skin:    Coloration: Skin is not jaundiced or pale.  Neurological:     Mental Status: She is alert and oriented to person, place, and time.      UC Treatments / Results  Labs (all labs ordered are  listed, but only abnormal results are displayed) Labs Reviewed  NOVEL CORONAVIRUS, NAA    EKG   Radiology No results found.  Procedures Procedures (including critical care time)  Medications Ordered in UC Medications - No data to display  Initial Impression / Assessment and Plan / UC Course  I have reviewed the triage vital signs and the nursing notes.  Pertinent labs & imaging results that were available during my care of the patient were reviewed by me and considered in my medical decision making (see chart for details).     Patient afebrile, nontoxic, with SpO2 99%.  Covid PCR pending.  Patient to quarantine until results are back.  We will continue supportive management.  Return precautions discussed, patient verbalized understanding and is agreeable to plan. Final Clinical Impressions(s) / UC Diagnoses   Final diagnoses:  Exposure to COVID-19 virus     Discharge Instructions     Your COVID test is pending - it is important to quarantine / isolate at home until your results are back. If you test positive and would like further evaluation for persistent or worsening symptoms, you may schedule an E-visit or virtual (video) visit throughout the [facility] app or website.  PLEASE NOTE: If you develop severe chest pain or shortness of breath please go to the ER or call 9-1-1 for further evaluation --> DO NOT schedule electronic or virtual visits for this. Please call our office for further guidance / recommendations as needed.    ED Prescriptions    None     PDMP not reviewed this encounter.   Neldon Mc [place], [place] 10/08/19 1938

## 2019-10-08 NOTE — Discharge Instructions (Signed)
Your COVID test is pending - it is important to quarantine / isolate at home until your results are back. °If you test positive and would like further evaluation for persistent or worsening symptoms, you may schedule an E-visit or virtual (video) visit throughout the [facility] MyChart app or website. ° °PLEASE NOTE: If you develop severe chest pain or shortness of breath please go to the ER or call 9-1-1 for further evaluation --> DO NOT schedule electronic or virtual visits for this. °Please call our office for further guidance / recommendations as needed. °

## 2019-10-08 NOTE — ED Triage Notes (Signed)
Pt presents to [facility] for assessment after exposure to COVID on 12/31.  Denies symptoms at this time.

## 2019-10-11 LAB — NOVEL CORONAVIRUS, NAA: SARS-CoV-2, NAA: NOT DETECTED

## 2019-11-05 ENCOUNTER — Ambulatory Visit: Payer: Medicaid Other | Admitting: Family Medicine

## 2020-02-18 ENCOUNTER — Other Ambulatory Visit: Payer: Self-pay

## 2020-02-18 ENCOUNTER — Ambulatory Visit
Admission: EM | Admit: 2020-02-18 | Discharge: 2020-02-18 | Disposition: A | Discharge status: Home / Self Care | Payer: Medicaid Other | Attending: Physician Assistant | Admitting: Physician Assistant

## 2020-02-18 ENCOUNTER — Encounter: Payer: Self-pay | Admitting: Emergency Medicine

## 2020-02-18 DIAGNOSIS — R519 Headache, unspecified: Secondary | ICD-10-CM

## 2020-02-18 DIAGNOSIS — M542 Cervicalgia: Secondary | ICD-10-CM

## 2020-02-18 DIAGNOSIS — R42 Dizziness and giddiness: Secondary | ICD-10-CM

## 2020-02-18 LAB — POCT URINALYSIS DIP (MANUAL ENTRY)
Bilirubin, UA: NEGATIVE
Glucose, UA: NEGATIVE mg/dL
Ketones, POC UA: NEGATIVE mg/dL
Leukocytes, UA: NEGATIVE
Nitrite, UA: NEGATIVE
Protein Ur, POC: NEGATIVE mg/dL
Spec Grav, UA: 1.025 (ref 1.010–1.025)
Urobilinogen, UA: 0.2 E.U./dL
pH, UA: 7.5 (ref 5.0–8.0)

## 2020-02-18 NOTE — ED Provider Notes (Signed)
[name]    CSN: [id number] Arrival date & time: 02/18/20  1346      History   Chief Complaint Chief Complaint  Patient presents with  . Nausea  . Headache    HPI Joyce Welch is a 49 y.o. female.   49 year old female with history of asthma, GERD, HTN, OAB comes in for acute onset of headache, lightheadedness. States had gone to work without any symptoms. While working, developed headache. Headache is to the frontal/occipital area, aching, intermittent, no exacerbating/alleviating factor. States had associated lightheadedness and felt unbalanced. Nausea without vomiting, this has resolved. She had a short duration of sternal chest pain that has resolved. Denies shortness of breath. Denies exertional fatigue, exertional chest pain, dyspnea on exertion. Denies URI symptoms. Denies fever, chills, body aches. No recent head injuries. Has baseline epigastric pain that is being managed by PCP and not worsened. Denies diarrhea. Urinary frequency for the past 2 days. Baseline urinary urgency that has not changed. Denies hematuria, dysuria.       Past Medical History:  Diagnosis Date  . Asthma   . Chronic cough   . Fibroids   . GERD (gastroesophageal reflux disease)   . Hypertension   . OAB (overactive bladder)     Patient Active Problem List   Diagnosis Date Noted  . Paresthesia 10/17/2018  . Weakness 10/17/2018  . Cough 06/20/2018  . Post-nasal drip 06/20/2018  . Globus sensation 06/20/2018  . Asthma exacerbation 10/31/2017  . Leukocytosis 10/31/2017  . Strain of quadriceps tendon 02/14/2014  . Symptomatic menopausal or female climacteric states 07/31/2013    Past Surgical History:  Procedure Laterality Date  . ABDOMINAL HYSTERECTOMY  02/05/2006   partial  . [place], 2002, 2005,    x 3  . MAXILLARY ANTROSTOMY Right 10/07/2018   Procedure: RIGHT MAXILLARY ANTROSTOMY WITH TISSUE REMOVAL.;  Surgeon: Melida Quitter, MD;  Location: [facility];  Service: ENT;  Laterality: Right;    OB History    Gravida  3   Para  3   Term  1   Preterm      AB      Living  3     SAB      TAB      Ectopic      Multiple      Live Births  3            Home Medications    Prior to Admission medications   Medication Sig Start Date End Date Taking? Authorizing Provider  albuterol (PROVENTIL HFA;VENTOLIN HFA) 108 (90 BASE) MCG/ACT inhaler Inhale 1 puff into the lungs every 6 (six) hours as needed for wheezing or shortness of breath.     [provider]  albuterol (PROVENTIL) (2.5 MG/3ML) 0.083% nebulizer solution Take 2.5 mg by nebulization every 6 (six) hours as needed for wheezing or shortness of breath.    [provider]  amLODipine (NORVASC) 5 MG tablet Take 5 mg by mouth daily.    [provider]  azelastine (ASTELIN) 0.1 % nasal spray Place 1 spray into both nostrils 2 (two) times daily. Use in each nostril as directed Patient taking differently: Place 1 spray into both nostrils See admin instructions. Instill 1 spray into each nostril two times a day as directed 06/04/18   [name], MD  diclofenac (VOLTAREN) 75 MG EC tablet Take 1 tablet (75 mg total) by mouth 2 (two) times daily. 10/06/19  Dene Gentry, MD  DULERA 100-5 MCG/ACT AERO INHALE 2 PUFFS INTO THE LUNGS TWICE DAILY 11/15/18   Fenton Foy, NP  fluticasone (FLONASE) 50 MCG/ACT nasal spray Place 2 sprays into both nostrils daily.     [provider]  loratadine (CLARITIN) 10 MG tablet Take 10 mg by mouth daily.    [provider]  pantoprazole (PROTONIX) 40 MG tablet Take 40 mg by mouth every morning. 09/07/19   [provider]  sucralfate (CARAFATE) 1 g tablet Take 1 tablet (1 g total) by mouth 4 (four) times daily. Patient taking differently: Take 1 g by mouth 3 (three) times daily.  06/22/19   Lacretia Leigh, MD  tiZANidine (ZANAFLEX) 4 MG tablet 2 (two) times daily. 05/20/19    [provider]  Vitamin D, Ergocalciferol, (DRISDOL) 1.25 MG (50000 UT) CAPS capsule Take 50,000 Units by mouth once a week. 05/16/19   [provider]    Family History Family History  Problem Relation Age of Onset  . Hypertension Mother   . Other Father        unsure of medical history  . Breast cancer Maternal Aunt     Social History Social History   Tobacco Use  . Smoking status: Never Smoker  . Smokeless tobacco: Never Used  Substance Use Topics  . Alcohol use: Yes    Comment: socially  . Drug use: Never     Allergies   Patient has no known allergies.   Review of Systems Review of Systems  Reason unable to perform ROS: See HPI as above.     Physical Exam Triage Vital Signs ED Triage Vitals  Enc Vitals Group     BP 02/18/20 1359 126/84     Pulse Rate 02/18/20 1359 97     Resp 02/18/20 1359 (!) 24     Temp 02/18/20 1359 98.4 F (36.9 C)     Temp Source 02/18/20 1359 Oral     SpO2 02/18/20 1359 98 %     Weight --      Height --      Head Circumference --      Peak Flow --      Pain Score 02/18/20 1355 5     Pain Loc --      Pain Edu? --      Excl. in [place]? --    Orthostatic VS for the past 24 hrs:  BP- Lying Pulse- Lying BP- Sitting Pulse- Sitting BP- Standing at 0 minutes Pulse- Standing at 0 minutes  02/18/20 1458 127/81 88 134/83 92 147/88 89    Updated Vital Signs BP 126/84 (BP Location: Left Arm) Comment (BP Location): large cuff  Pulse 97   Temp 98.4 F (36.9 C) (Oral)   Resp (!) 24   SpO2 98%   Physical Exam Constitutional:      General: She is not in acute distress.    Appearance: Normal appearance. She is not ill-appearing, toxic-appearing or diaphoretic.  HENT:     Head: Normocephalic and atraumatic.     Mouth/Throat:     Mouth: Mucous membranes are moist.     Pharynx: Oropharynx is clear. Uvula midline.  Eyes:     Extraocular Movements: Extraocular movements intact.     Conjunctiva/sclera: Conjunctivae  normal.     Pupils: Pupils are equal, round, and reactive to light.  Neck:     Comments: Left neck tenderness to palpation.  Cardiovascular:     Rate and Rhythm: Normal  rate and regular rhythm.     Heart sounds: Normal heart sounds. No murmur. No friction rub. No gallop.   Pulmonary:     Effort: Pulmonary effort is normal. No accessory muscle usage, prolonged expiration, respiratory distress or retractions.     Comments: Lungs clear to auscultation without adventitious lung sounds. Chest:     Comments: No tenderness to palpation of chest.  Musculoskeletal:     Cervical back: Normal range of motion and neck supple.  Neurological:     General: No focal deficit present.     Mental Status: She is alert and oriented to person, place, and time.     Comments: Cranial nerves II-XII grossly intact. Strength 5/5 bilaterally for upper and lower extremity. Sensation intact. Normal coordination with normal finger to nose, heel to shin. Negative pronator drift, romberg. Gait intact. Able to ambulate on own without difficulty.       UC Treatments / Results  Labs (all labs ordered are listed, but only abnormal results are displayed) Labs Reviewed  POCT URINALYSIS DIP (MANUAL ENTRY) - Abnormal; Notable for the following components:      Result Value   Blood, UA trace-intact (*)    All other components within normal limits    EKG   Radiology No results found.  Procedures Procedures (including critical care time)  Medications Ordered in UC Medications - No data to display  Initial Impression / Assessment and Plan / UC Course  I have reviewed the triage vital signs and the nursing notes.  Pertinent labs & imaging results that were available during my care of the patient were reviewed by me and considered in my medical decision making (see chart for details).    No alarming signs on exam.  Urine negative for infection.  Negative orthostatics.  EKG normal sinus rhythm, no acute ST  changes.  Neurology exam grossly intact.  Will treat symptomatically for neck pain, to restart NSAIDs, muscle relaxers.  Push fluids.  Return precautions given.  Patient expresses understanding and agrees to plan.  Final Clinical Impressions(s) / UC Diagnoses   Final diagnoses:  Neck pain on left side  Acute intractable headache, unspecified headache type  Lightheadedness   ED Prescriptions    None     I have reviewed the PDMP during this encounter.   Ok Edwards, PA-C 02/18/20 1544

## 2020-02-18 NOTE — Discharge Instructions (Signed)
No alarming signs on exam. Your urine did not show an infection. EKG normal. No signs of dehydration. For now, will treat muscle symptoms. Restart diclofenac as directed. Keep hydrated, urine should be clear to pale yellow in color. Follow up with PCP if symptoms not improving. If worsening symptoms, chest pain/shortness of breath, one sided weakness, passing out, go to the ED for further evaluation.

## 2020-02-18 NOTE — ED Triage Notes (Signed)
Patient reports she got up feeling well.  Once patient got to work she started with a headache and nausea.  Patient complains of left thigh pain.

## 2020-04-11 ENCOUNTER — Other Ambulatory Visit: Payer: Self-pay | Admitting: Nurse Practitioner

## 2020-05-07 ENCOUNTER — Ambulatory Visit
Admission: EM | Admit: 2020-05-07 | Discharge: 2020-05-07 | Disposition: A | Discharge status: Home / Self Care | Payer: Medicaid Other | Attending: Physician Assistant | Admitting: Physician Assistant

## 2020-05-07 DIAGNOSIS — R079 Chest pain, unspecified: Secondary | ICD-10-CM

## 2020-05-07 NOTE — ED Provider Notes (Signed)
[name]    CSN: [id number] Arrival date & time: 05/07/20  1357      History   Chief Complaint Chief Complaint  Patient presents with  . Chest Pain    HPI Joyce Kocak is a 49 y.o. female.   49 year old female with history of asthma, HTN, GERD, comes in for 2-day history of intermittent left sided chest pain.  States pain is exertional, present when climbing stairs with associated shortness of breath. Resolves with rest. Feels that using inhaler can help with the symptoms. Denies associated diaphoresis, nausea, vomiting, dizziness, weakness.  Denies radiating pain. States work requires heavy lifting and strenuous activity, and have not had any exertional fatigue, exertional chest pain, dyspnea on exertion leading up to this point. Denies URI symptoms, fever. Denies personal or family history of heart disease. Has stress test done 08/2019 with negative results.     Past Medical History:  Diagnosis Date  . Asthma   . Chronic cough   . Fibroids   . GERD (gastroesophageal reflux disease)   . Hypertension   . OAB (overactive bladder)     Patient Active Problem List   Diagnosis Date Noted  . Paresthesia 10/17/2018  . Weakness 10/17/2018  . Cough 06/20/2018  . Post-nasal drip 06/20/2018  . Globus sensation 06/20/2018  . Asthma exacerbation 10/31/2017  . Leukocytosis 10/31/2017  . Strain of quadriceps tendon 02/14/2014  . Symptomatic menopausal or female climacteric states 07/31/2013    Past Surgical History:  Procedure Laterality Date  . ABDOMINAL HYSTERECTOMY  02/05/2006   partial  . [place], 2002, 2005,    x 3  . MAXILLARY ANTROSTOMY Right 10/07/2018   Procedure: RIGHT MAXILLARY ANTROSTOMY WITH TISSUE REMOVAL.;  Surgeon: Melida Quitter, MD;  Location: [facility];  Service: ENT;  Laterality: Right;    OB History    Gravida  3   Para  3   Term  1   Preterm      AB      Living  3     SAB      TAB       Ectopic      Multiple      Live Births  3            Home Medications    Prior to Admission medications   Medication Sig Start Date End Date Taking? Authorizing Provider  albuterol (PROVENTIL HFA;VENTOLIN HFA) 108 (90 BASE) MCG/ACT inhaler Inhale 1 puff into the lungs every 6 (six) hours as needed for wheezing or shortness of breath.    Yes [provider]  albuterol (PROVENTIL) (2.5 MG/3ML) 0.083% nebulizer solution Take 2.5 mg by nebulization every 6 (six) hours as needed for wheezing or shortness of breath.   Yes [provider]  azelastine (ASTELIN) 0.1 % nasal spray Place 1 spray into both nostrils 2 (two) times daily. Use in each nostril as directed Patient taking differently: Place 1 spray into both nostrils See admin instructions. Instill 1 spray into each nostril two times a day as directed 06/04/18  Yes [name], MD  DULERA 100-5 MCG/ACT AERO INHALE 2 PUFFS INTO THE LUNGS TWICE DAILY 11/15/18  Yes Fenton Foy, NP  fluticasone (FLONASE) 50 MCG/ACT nasal spray Place 2 sprays into both nostrils daily.    Yes [provider]  loratadine (CLARITIN) 10 MG tablet Take 10 mg by mouth daily.   Yes [provider]  pantoprazole (PROTONIX) 40 MG tablet  Take 40 mg by mouth every morning. 09/07/19  Yes [provider]  Vitamin D, Ergocalciferol, (DRISDOL) 1.25 MG (50000 UT) CAPS capsule Take 50,000 Units by mouth once a week. 05/16/19  Yes [provider]  amLODipine (NORVASC) 5 MG tablet Take 5 mg by mouth daily.    [provider]  diclofenac (VOLTAREN) 75 MG EC tablet Take 1 tablet (75 mg total) by mouth 2 (two) times daily. 10/06/19   Hudnall, Sharyn Lull, MD  NOREL AD 4-10-325 MG TABS Take 1 tablet by mouth 2 (two) times daily as needed. 02/20/20   [provider]  sucralfate (CARAFATE) 1 g tablet Take 1 tablet (1 g total) by mouth 4 (four) times daily. Patient taking differently: Take 1 g by mouth 3 (three) times  daily.  06/22/19   Lacretia Leigh, MD  tiZANidine (ZANAFLEX) 4 MG tablet 2 (two) times daily. 05/20/19   [provider]    Family History Family History  Problem Relation Age of Onset  . Hypertension Mother   . Other Father        unsure of medical history  . Breast cancer Maternal Aunt     Social History Social History   Tobacco Use  . Smoking status: Never Smoker  . Smokeless tobacco: Never Used  Vaping Use  . Vaping Use: Never used  Substance Use Topics  . Alcohol use: Yes    Comment: socially  . Drug use: Never     Allergies   Patient has no known allergies.   Review of Systems Review of Systems  Reason unable to perform ROS: See HPI as above.     Physical Exam Triage Vital Signs ED Triage Vitals  Enc Vitals Group     BP 05/07/20 1410 139/87     Pulse Rate 05/07/20 1410 90     Resp 05/07/20 1410 20     Temp 05/07/20 1410 98.2 F (36.8 C)     Temp Source 05/07/20 1410 Oral     SpO2 05/07/20 1410 98 %     Weight --      Height --      Head Circumference --      Peak Flow --      Pain Score 05/07/20 1426 6     Pain Loc --      Pain Edu? --      Excl. in [place]? --    No data found.  Updated Vital Signs BP 139/87 (BP Location: Left Arm)   Pulse 90   Temp 98.2 F (36.8 C) (Oral)   Resp 20   SpO2 98%   Physical Exam Constitutional:      General: She is not in acute distress.    Appearance: Normal appearance. She is well-developed. She is not toxic-appearing or diaphoretic.  HENT:     Head: Normocephalic and atraumatic.  Eyes:     Conjunctiva/sclera: Conjunctivae normal.     Pupils: Pupils are equal, round, and reactive to light.  Cardiovascular:     Rate and Rhythm: Normal rate and regular rhythm.     Heart sounds: No murmur heard.  No friction rub. No gallop.   Pulmonary:     Effort: Pulmonary effort is normal. No respiratory distress.  Chest:     Chest wall: No tenderness.  Musculoskeletal:     Cervical back: Normal range of  motion and neck supple.     Comments: Full ROM of BUE. Strength 5/5. Sensation intact. Radial pulse 2+  Skin:    General: Skin is warm and dry.  Neurological:     Mental Status: She is alert and oriented to person, place, and time.      UC Treatments / Results  Labs (all labs ordered are listed, but only abnormal results are displayed) Labs Reviewed - No data to display  EKG   Radiology No results found.  Procedures Procedures (including critical care time)  Medications Ordered in UC Medications - No data to display  Initial Impression / Assessment and Plan / UC Course  I have reviewed the triage vital signs and the nursing notes.  Pertinent labs & imaging results that were available during my care of the patient were reviewed by me and considered in my medical decision making (see chart for details).    EKG NSR, 72bpm, no acute ST changes, no obvious changes from prior EKG. Patient without current chest pain. Discussed although exertional chest pain with worries for heart involvement, at this time does not require ED evaluation. ? Stable angina vs asthma exacerbation. LCTAB. To trial albuterol prior to exertion. Otherwise, to follow up with cardiology for possible repeat stress test. Strict return precautions given. Patient expresses understanding and agrees to plan.  Final Clinical Impressions(s) / UC Diagnoses   Final diagnoses:  Chest pain, unspecified type    ED Prescriptions    None     PDMP not reviewed this encounter.   Ok Edwards, PA-C 05/07/20 1622

## 2020-05-07 NOTE — Discharge Instructions (Signed)
Your EKG normal at this time. Please follow up with cardiology for recheck. If having chest pain not resolving with rest, go to the ED for further evaluation.

## 2020-05-07 NOTE — ED Triage Notes (Signed)
Pt c/o central and left sternal CP for approx 2 days. Pt has difficulty qualifying a description for pain; rates it 5/10 pain, "it's not normal feeling". Also reports SOBOE when going up steps. Pt reports she has been under more stress recently. Denies dizziness, diaphoresis, pain radiating to left jaw, shoulder, arm/back, n/v, extremity weakness, leg pain/swelling.

## 2020-09-19 ENCOUNTER — Ambulatory Visit
Admission: EM | Admit: 2020-09-19 | Discharge: 2020-09-19 | Disposition: A | Discharge status: Home / Self Care | Payer: Medicaid Other | Attending: Family Medicine | Admitting: Family Medicine

## 2020-09-19 ENCOUNTER — Other Ambulatory Visit: Payer: Self-pay

## 2020-09-19 DIAGNOSIS — R5383 Other fatigue: Secondary | ICD-10-CM | POA: Diagnosis not present

## 2020-09-19 DIAGNOSIS — Z1152 Encounter for screening for COVID-19: Secondary | ICD-10-CM | POA: Diagnosis not present

## 2020-09-19 DIAGNOSIS — R5381 Other malaise: Secondary | ICD-10-CM

## 2020-09-19 NOTE — ED Triage Notes (Signed)
Pt states she has had body aches since yesterday. Pt was at her primary physician and was good. Pt states she is also fatigued.

## 2020-09-19 NOTE — Discharge Instructions (Signed)
Please take ibuprofen or tylenol as needed  Please stay hydrated  We will call with any positives.

## 2020-09-19 NOTE — ED Provider Notes (Signed)
[name]    CSN: [id number] Arrival date & time: 09/19/20  1542      History   Chief Complaint Chief Complaint  Patient presents with  . Sore Throat    Since yesterday    HPI Joyce Welch is a 49 y.o. female.   She is presenting with malaise and fatigue.  Symptoms ongoing since yesterday.  She was seen on Friday by her primary care physician and felt like her normal self.  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  Has gone her Covid vaccines.  Has not received a flu vaccine.  HPI  Past Medical History:  Diagnosis Date  . Asthma   . Chronic cough   . Fibroids   . GERD (gastroesophageal reflux disease)   . Hypertension   . OAB (overactive bladder)     Patient Active Problem List   Diagnosis Date Noted  . Paresthesia 10/17/2018  . Weakness 10/17/2018  . Cough 06/20/2018  . Post-nasal drip 06/20/2018  . Globus sensation 06/20/2018  . Asthma exacerbation 10/31/2017  . Leukocytosis 10/31/2017  . Strain of quadriceps tendon 02/14/2014  . Symptomatic menopausal or female climacteric states 07/31/2013    Past Surgical History:  Procedure Laterality Date  . ABDOMINAL HYSTERECTOMY  02/05/2006   partial  . [place], 2002, 2005,    x 3  . MAXILLARY ANTROSTOMY Right 10/07/2018   Procedure: RIGHT MAXILLARY ANTROSTOMY WITH TISSUE REMOVAL.;  Surgeon: Melida Quitter, MD;  Location: [facility];  Service: ENT;  Laterality: Right;    OB History    Gravida  3   Para  3   Term  1   Preterm      AB      Living  3     SAB      IAB      Ectopic      Multiple      Live Births  3            Home Medications    Prior to Admission medications   Medication Sig Start Date End Date Taking? Authorizing Provider  albuterol (PROVENTIL HFA;VENTOLIN HFA) 108 (90 BASE) MCG/ACT inhaler Inhale 1 puff into the lungs every 6 (six) hours as needed for wheezing or shortness of breath.     [provider]  albuterol  (PROVENTIL) (2.5 MG/3ML) 0.083% nebulizer solution Take 2.5 mg by nebulization every 6 (six) hours as needed for wheezing or shortness of breath.    [provider]  amLODipine (NORVASC) 5 MG tablet Take 5 mg by mouth daily.    [provider]  azelastine (ASTELIN) 0.1 % nasal spray Place 1 spray into both nostrils 2 (two) times daily. Use in each nostril as directed Patient taking differently: Place 1 spray into both nostrils See admin instructions. Instill 1 spray into each nostril two times a day as directed 06/04/18   [name], MD  diclofenac (VOLTAREN) 75 MG EC tablet Take 1 tablet (75 mg total) by mouth 2 (two) times daily. 10/06/19   Dene Gentry, MD  DULERA 100-5 MCG/ACT AERO INHALE 2 PUFFS INTO THE LUNGS TWICE DAILY 11/15/18   Fenton Foy, NP  fluticasone (FLONASE) 50 MCG/ACT nasal spray Place 2 sprays into both nostrils daily.     [provider]  loratadine (CLARITIN) 10 MG tablet Take 10 mg by mouth daily.    [provider]  NOREL AD 4-10-325 MG TABS Take 1 tablet by  mouth 2 (two) times daily as needed. 02/20/20   [provider]  pantoprazole (PROTONIX) 40 MG tablet Take 40 mg by mouth every morning. 09/07/19   [provider]  sucralfate (CARAFATE) 1 g tablet Take 1 tablet (1 g total) by mouth 4 (four) times daily. Patient taking differently: Take 1 g by mouth 3 (three) times daily.  06/22/19   Lacretia Leigh, MD  tiZANidine (ZANAFLEX) 4 MG tablet 2 (two) times daily. 05/20/19   [provider]  Vitamin D, Ergocalciferol, (DRISDOL) 1.25 MG (50000 UT) CAPS capsule Take 50,000 Units by mouth once a week. 05/16/19   [provider]    Family History Family History  Problem Relation Age of Onset  . Hypertension Mother   . Other Father        unsure of medical history  . Breast cancer Maternal Aunt     Social History Social History   Tobacco Use  . Smoking status: Never Smoker  . Smokeless tobacco:  Never Used  Vaping Use  . Vaping Use: Never used  Substance Use Topics  . Alcohol use: Yes    Comment: socially  . Drug use: Never     Allergies   Patient has no known allergies.   Review of Systems Review of Systems  See HPI   Physical Exam Triage Vital Signs ED Triage Vitals [09/19/20 1638]  Enc Vitals Group     BP      Pulse      Resp      Temp      Temp src      SpO2      Weight      Height      Head Circumference      Peak Flow      Pain Score 0     Pain Loc      Pain Edu?      Excl. in [place]?    No data found.  Updated Vital Signs There were no vitals taken for this visit.  Visual Acuity Right Eye Distance:   Left Eye Distance:   Bilateral Distance:    Right Eye Near:   Left Eye Near:    Bilateral Near:     Physical Exam Gen: NAD, alert, cooperative with exam,  ENT: normal lips, normal nasal mucosa, tympanic membranes clear and intact bilaterally Eye: normal EOM, normal conjunctiva and lids CV:   S1-S2 Resp: no accessory muscle use, non-labored,  Skin: no rashes, no areas of induration  Neuro: normal tone, normal sensation to touch Psych:  normal insight, alert and oriented    UC Treatments / Results  Labs (all labs ordered are listed, but only abnormal results are displayed) Labs Reviewed  COVID-19, FLU A+B NAA    EKG   Radiology No results found.  Procedures Procedures (including critical care time)  Medications Ordered in UC Medications - No data to display  Initial Impression / Assessment and Plan / UC Course  I have reviewed the triage vital signs and the nursing notes.  Pertinent labs & imaging results that were available during my care of the patient were reviewed by me and considered in my medical decision making (see chart for details).     Joyce Welch is a 48 year old female is having malaise and fatigue.  Possible be related to Covid or flu.  The swabs were obtained.  Counseled supportive care.  Given indications on  follow-up.  Final Clinical Impressions(s) / UC Diagnoses  Final diagnoses:  Malaise and fatigue  Encounter for screening for COVID-19     Discharge Instructions     Please take ibuprofen or tylenol as needed  Please stay hydrated  We will call with any positives.     ED Prescriptions    None     PDMP not reviewed this encounter.   Rosemarie Ax, MD 09/19/20 1736

## 2020-09-21 LAB — COVID-19, FLU A+B NAA
Influenza A, NAA: NOT DETECTED
Influenza B, NAA: NOT DETECTED
SARS-CoV-2, NAA: DETECTED — AB

## 2020-10-01 ENCOUNTER — Ambulatory Visit
Admission: EM | Admit: 2020-10-01 | Discharge: 2020-10-01 | Disposition: A | Discharge status: Home / Self Care | Payer: Medicaid Other | Attending: Emergency Medicine | Admitting: Emergency Medicine

## 2020-10-01 ENCOUNTER — Other Ambulatory Visit: Payer: Self-pay

## 2020-10-01 DIAGNOSIS — U071 COVID-19: Secondary | ICD-10-CM | POA: Diagnosis not present

## 2020-10-01 DIAGNOSIS — M546 Pain in thoracic spine: Secondary | ICD-10-CM

## 2020-10-01 DIAGNOSIS — M5442 Lumbago with sciatica, left side: Secondary | ICD-10-CM | POA: Diagnosis not present

## 2020-10-01 MED ORDER — PREDNISONE 50 MG PO TABS: 50.0000 mg | ORAL_TABLET | Freq: Every day | ORAL | 0 refills | Status: DC

## 2020-10-01 NOTE — ED Provider Notes (Signed)
[name]    CSN: [id number] Arrival date & time: 10/01/20  [id number]      History   Chief Complaint Chief Complaint  Patient presents with  . Medication Refill    Pt would like a refill on Prednisone   . Back Pain    Pt present left side back pain.     HPI Joyce Welch is a 49 y.o. female  With history as below presenting for persistent cough, left-sided thoracic back pain, dyspnea. Patient is Covid positive: Tested positive on 12/19. Has been using nebulizer, benzonatate with some relief. States that she has had prednisone in the past for URIs, feels this will be helpful. History of DVT, PE, chest pain  Past Medical History:  Diagnosis Date  . Asthma   . Chronic cough   . Fibroids   . GERD (gastroesophageal reflux disease)   . Hypertension   . OAB (overactive bladder)     Patient Active Problem List   Diagnosis Date Noted  . Paresthesia 10/17/2018  . Weakness 10/17/2018  . Cough 06/20/2018  . Post-nasal drip 06/20/2018  . Globus sensation 06/20/2018  . Asthma exacerbation 10/31/2017  . Leukocytosis 10/31/2017  . Strain of quadriceps tendon 02/14/2014  . Symptomatic menopausal or female climacteric states 07/31/2013    Past Surgical History:  Procedure Laterality Date  . ABDOMINAL HYSTERECTOMY  02/05/2006   partial  . [place], 2002, 2005,    x 3  . MAXILLARY ANTROSTOMY Right 10/07/2018   Procedure: RIGHT MAXILLARY ANTROSTOMY WITH TISSUE REMOVAL.;  Surgeon: Melida Quitter, MD;  Location: [place];  Service: ENT;  Laterality: Right;    OB History    Gravida  3   Para  3   Term  1   Preterm      AB      Living  3     SAB      IAB      Ectopic      Multiple      Live Births  3            Home Medications    Prior to Admission medications   Medication Sig Start Date End Date Taking? Authorizing Provider  predniSONE (DELTASONE) 50 MG tablet Take 1 tablet (50 mg total) by mouth daily with  breakfast. 10/01/20  Yes Hall-Potvin, [place], PA-C  albuterol (PROVENTIL HFA;VENTOLIN HFA) 108 (90 BASE) MCG/ACT inhaler Inhale 1 puff into the lungs every 6 (six) hours as needed for wheezing or shortness of breath.     [provider]  albuterol (PROVENTIL) (2.5 MG/3ML) 0.083% nebulizer solution Take 2.5 mg by nebulization every 6 (six) hours as needed for wheezing or shortness of breath.    [provider]  amLODipine (NORVASC) 5 MG tablet Take 5 mg by mouth daily.    [provider]  azelastine (ASTELIN) 0.1 % nasal spray Place 1 spray into both nostrils 2 (two) times daily. Use in each nostril as directed Patient taking differently: Place 1 spray into both nostrils See admin instructions. Instill 1 spray into each nostril two times a day as directed 06/04/18   [name], MD  diclofenac (VOLTAREN) 75 MG EC tablet Take 1 tablet (75 mg total) by mouth 2 (two) times daily. 10/06/19   Dene Gentry, MD  DULERA 100-5 MCG/ACT AERO INHALE 2 PUFFS INTO THE LUNGS TWICE DAILY 11/15/18   Fenton Foy, NP  fluticasone [facility]) 50 MCG/ACT nasal spray Place  2 sprays into both nostrils daily.     [provider]  loratadine (CLARITIN) 10 MG tablet Take 10 mg by mouth daily.    [provider]  NOREL AD 4-10-325 MG TABS Take 1 tablet by mouth 2 (two) times daily as needed. 02/20/20   [provider]  pantoprazole (PROTONIX) 40 MG tablet Take 40 mg by mouth every morning. 09/07/19   [provider]  sucralfate (CARAFATE) 1 g tablet Take 1 tablet (1 g total) by mouth 4 (four) times daily. Patient taking differently: Take 1 g by mouth 3 (three) times daily.  06/22/19   Lacretia Leigh, MD  tiZANidine (ZANAFLEX) 4 MG tablet 2 (two) times daily. 05/20/19   [provider]  Vitamin D, Ergocalciferol, (DRISDOL) 1.25 MG (50000 UT) CAPS capsule Take 50,000 Units by mouth once a week. 05/16/19   [provider]    Family History Family  History  Problem Relation Age of Onset  . Hypertension Mother   . Other Father        unsure of medical history  . Breast cancer Maternal Aunt     Social History Social History   Tobacco Use  . Smoking status: Never Smoker  . Smokeless tobacco: Never Used  Vaping Use  . Vaping Use: Never used  Substance Use Topics  . Alcohol use: Yes    Comment: socially  . Drug use: Never     Allergies   Patient has no known allergies.   Review of Systems Review of Systems  Constitutional: Positive for fatigue. Negative for fever.  HENT: Positive for congestion. Negative for dental problem, ear pain, facial swelling, hearing loss, sinus pain, sore throat, trouble swallowing and voice change.   Eyes: Negative for photophobia, pain and visual disturbance.  Respiratory: Positive for cough and shortness of breath. Negative for wheezing.   Cardiovascular: Negative for chest pain and palpitations.  Gastrointestinal: Negative for diarrhea and vomiting.  Musculoskeletal: Negative for arthralgias and myalgias.  Neurological: Negative for dizziness and headaches.     Physical Exam Triage Vital Signs ED Triage Vitals  Enc Vitals Group     BP 10/01/20 0912 111/77     Pulse Rate 10/01/20 0912 90     Resp 10/01/20 0912 16     Temp 10/01/20 0912 97.9 F (36.6 C)     Temp Source 10/01/20 0912 Oral     SpO2 10/01/20 0912 99 %     Weight --      Height --      Head Circumference --      Peak Flow --      Pain Score 10/01/20 0911 5     Pain Loc --      Pain Edu? --      Excl. in [place]? --    No data found.  Updated Vital Signs BP 111/77 (BP Location: Right Arm)   Pulse 90   Temp 97.9 F (36.6 C) (Oral)   Resp 16   SpO2 99%   Visual Acuity Right Eye Distance:   Left Eye Distance:   Bilateral Distance:    Right Eye Near:   Left Eye Near:    Bilateral Near:     Physical Exam Constitutional:      General: She is not in acute distress.    Appearance: She is not ill-appearing or  diaphoretic.  HENT:     Head: Normocephalic and atraumatic.     Right Ear: Tympanic membrane and ear canal normal.  Left Ear: Tympanic membrane and ear canal normal.     Mouth/Throat:     Mouth: Mucous membranes are moist.     Pharynx: Oropharynx is clear. No oropharyngeal exudate or posterior oropharyngeal erythema.  Eyes:     General: No scleral icterus.    Conjunctiva/sclera: Conjunctivae normal.     Pupils: Pupils are equal, round, and reactive to light.  Neck:     Comments: Trachea midline, negative JVD Cardiovascular:     Rate and Rhythm: Normal rate and regular rhythm.     Heart sounds: No murmur heard. No gallop.   Pulmonary:     Effort: Pulmonary effort is normal. No respiratory distress.     Breath sounds: No wheezing, rhonchi or rales.  Musculoskeletal:     Cervical back: Neck supple. No tenderness.  Lymphadenopathy:     Cervical: No cervical adenopathy.  Skin:    Capillary Refill: Capillary refill takes less than 2 seconds.     Coloration: Skin is not jaundiced or pale.     Findings: No rash.  Neurological:     General: No focal deficit present.     Mental Status: She is alert and oriented to person, place, and time.      UC Treatments / Results  Labs (all labs ordered are listed, but only abnormal results are displayed) Labs Reviewed - No data to display  EKG   Radiology No results found.  Procedures Procedures (including critical care time)  Medications Ordered in UC Medications - No data to display  Initial Impression / Assessment and Plan / UC Course  I have reviewed the triage vital signs and the nursing notes.  Pertinent labs & imaging results that were available during my care of the patient were reviewed by me and considered in my medical decision making (see chart for details).     Patient afebrile, nontoxic, in no respiratory distress. Vitals are reassuring as well as exam. Given patient's history, back pain, willing to prescribe  prednisone. We will continue supportive care.  Return precautions discussed, pt verbalized understanding and is agreeable to plan. Final Clinical Impressions(s) / UC Diagnoses   Final diagnoses:  COVID-19 virus infection  Acute left-sided thoracic back pain   Discharge Instructions   None    ED Prescriptions    Medication Sig Dispense Auth. Provider   predniSONE (DELTASONE) 50 MG tablet Take 1 tablet (50 mg total) by mouth daily with breakfast. 5 tablet Hall-Potvin, [place], PA-C     PDMP not reviewed this encounter.   Hall-Potvin, [place], [place] 10/01/20 [phone]

## 2020-10-01 NOTE — ED Triage Notes (Addendum)
Pt present left side lower back pain. Pt states symptoms started a week ago. She would also like a refill on prednisone to help breathing. Pt recently tested positive on 09/21/20.

## 2020-10-28 ENCOUNTER — Ambulatory Visit (INDEPENDENT_AMBULATORY_CARE_PROVIDER_SITE_OTHER): Payer: Medicaid Other

## 2020-10-28 ENCOUNTER — Ambulatory Visit
Admission: EM | Admit: 2020-10-28 | Discharge: 2020-10-28 | Disposition: A | Discharge status: Home / Self Care | Payer: Medicaid Other | Attending: Urgent Care | Admitting: Urgent Care

## 2020-10-28 ENCOUNTER — Encounter: Payer: Self-pay | Admitting: Urgent Care

## 2020-10-28 ENCOUNTER — Other Ambulatory Visit: Payer: Self-pay

## 2020-10-28 DIAGNOSIS — J4540 Moderate persistent asthma, uncomplicated: Secondary | ICD-10-CM

## 2020-10-28 DIAGNOSIS — R059 Cough, unspecified: Secondary | ICD-10-CM

## 2020-10-28 DIAGNOSIS — Z8616 Personal history of COVID-19: Secondary | ICD-10-CM

## 2020-10-28 DIAGNOSIS — R0781 Pleurodynia: Secondary | ICD-10-CM

## 2020-10-28 HISTORY — DX: COVID-19: U07.1

## 2020-10-28 MED ORDER — ALBUTEROL SULFATE (2.5 MG/3ML) 0.083% IN NEBU: 2.5000 mg | INHALATION_SOLUTION | Freq: Four times a day (QID) | RESPIRATORY_TRACT | 0 refills | Status: DC | PRN

## 2020-10-28 MED ORDER — ALBUTEROL SULFATE HFA 108 (90 BASE) MCG/ACT IN AERS: 1.0000 | INHALATION_SPRAY | Freq: Four times a day (QID) | RESPIRATORY_TRACT | 0 refills | Status: AC | PRN

## 2020-10-28 MED ORDER — PROMETHAZINE-DM 6.25-15 MG/5ML PO SYRP: 5.0000 mL | ORAL_SOLUTION | Freq: Every evening | ORAL | 0 refills | Status: DC | PRN

## 2020-10-28 MED ORDER — PREDNISONE 20 MG PO TABS: ORAL_TABLET | ORAL | 0 refills | Status: DC

## 2020-10-28 MED ORDER — BENZONATATE 100 MG PO CAPS: 100.0000 mg | ORAL_CAPSULE | Freq: Three times a day (TID) | ORAL | 0 refills | Status: DC | PRN

## 2020-10-28 NOTE — ED Triage Notes (Signed)
Pt presents with pain in left rib area with inhalation x 3 days. Covid positive 09/18/20.

## 2020-10-28 NOTE — ED Provider Notes (Signed)
[place]   MRN: [id number] DOB: [birth year]  Subjective:   Joyce Welch is a 50 y.o. female presenting for 3-day history of recurrent left-sided lower chest pain worse with deep breathing.  Patient has a history of asthma, tested positive for COVID-19 on 09/18/2020.  She has already undergone a course of steroids at the end of December.  She had intermittent improvement until now.  Has been using her inhalers as needed.  Denies fever but continues to cough.  No current facility-administered medications for this encounter.  Current Outpatient Medications:  .  albuterol (PROVENTIL HFA;VENTOLIN HFA) 108 (90 BASE) MCG/ACT inhaler, Inhale 1 puff into the lungs every 6 (six) hours as needed for wheezing or shortness of breath. , Disp: , Rfl:  .  albuterol (PROVENTIL) (2.5 MG/3ML) 0.083% nebulizer solution, Take 2.5 mg by nebulization every 6 (six) hours as needed for wheezing or shortness of breath., Disp: , Rfl:  .  amLODipine (NORVASC) 5 MG tablet, Take 5 mg by mouth daily., Disp: , Rfl:  .  azelastine (ASTELIN) 0.1 % nasal spray, Place 1 spray into both nostrils 2 (two) times daily. Use in each nostril as directed (Patient taking differently: Place 1 spray into both nostrils See admin instructions. Instill 1 spray into each nostril two times a day as directed), Disp: 30 mL, Rfl: 12 .  diclofenac (VOLTAREN) 75 MG EC tablet, Take 1 tablet (75 mg total) by mouth 2 (two) times daily., Disp: 60 tablet, Rfl: 1 .  DULERA 100-5 MCG/ACT AERO, INHALE 2 PUFFS INTO THE LUNGS TWICE DAILY, Disp: 13 g, Rfl: 3 .  fluticasone (FLONASE) 50 MCG/ACT nasal spray, Place 2 sprays into both nostrils daily. , Disp: , Rfl:  .  loratadine (CLARITIN) 10 MG tablet, Take 10 mg by mouth daily., Disp: , Rfl:  .  NOREL AD 4-10-325 MG TABS, Take 1 tablet by mouth 2 (two) times daily as needed., Disp: , Rfl:  .  pantoprazole (PROTONIX) 40 MG tablet, Take 40 mg by mouth every morning., Disp: , Rfl:  .   predniSONE (DELTASONE) 50 MG tablet, Take 1 tablet (50 mg total) by mouth daily with breakfast., Disp: 5 tablet, Rfl: 0 .  sucralfate (CARAFATE) 1 g tablet, Take 1 tablet (1 g total) by mouth 4 (four) times daily. (Patient taking differently: Take 1 g by mouth 3 (three) times daily. ), Disp: 30 tablet, Rfl: 0 .  tiZANidine (ZANAFLEX) 4 MG tablet, 2 (two) times daily., Disp: , Rfl:  .  Vitamin D, Ergocalciferol, (DRISDOL) 1.25 MG (50000 UT) CAPS capsule, Take 50,000 Units by mouth once a week., Disp: , Rfl:    No Known Allergies  Past Medical History:  Diagnosis Date  . Asthma   . Chronic cough   . Fibroids   . GERD (gastroesophageal reflux disease)   . Hypertension   . OAB (overactive bladder)      Past Surgical History:  Procedure Laterality Date  . ABDOMINAL HYSTERECTOMY  02/05/2006   partial  . [place], 2002, 2005,    x 3  . MAXILLARY ANTROSTOMY Right 10/07/2018   Procedure: RIGHT MAXILLARY ANTROSTOMY WITH TISSUE REMOVAL.;  Surgeon: Melida Quitter, MD;  Location: [place];  Service: ENT;  Laterality: Right;    Family History  Problem Relation Age of Onset  . Hypertension Mother   . Other Father        unsure of medical history  . Breast cancer Maternal Aunt  Social History   Tobacco Use  . Smoking status: Never Smoker  . Smokeless tobacco: Never Used  Vaping Use  . Vaping Use: Never used  Substance Use Topics  . Alcohol use: Yes    Comment: socially  . Drug use: Never    ROS   Objective:   Vitals: BP 117/78   Pulse 97   Temp 98 F (36.7 C) (Oral)   Resp 20   SpO2 98%   Physical Exam Constitutional:      General: She is not in acute distress.    Appearance: Normal appearance. She is well-developed. She is obese. She is not ill-appearing, toxic-appearing or diaphoretic.  HENT:     Head: Normocephalic and atraumatic.     Nose: Nose normal.     Mouth/Throat:     Mouth: Mucous membranes are moist.     Pharynx:  Oropharynx is clear.  Eyes:     General: No scleral icterus.    Extraocular Movements: Extraocular movements intact.     Pupils: Pupils are equal, round, and reactive to light.  Cardiovascular:     Rate and Rhythm: Normal rate and regular rhythm.     Pulses: Normal pulses.     Heart sounds: Normal heart sounds. No murmur heard. No friction rub. No gallop.   Pulmonary:     Effort: Pulmonary effort is normal. No respiratory distress.     Breath sounds: Normal breath sounds. No stridor. No wheezing, rhonchi or rales.  Chest:     Chest wall: No tenderness.  Skin:    General: Skin is warm and dry.     Findings: No rash.  Neurological:     General: No focal deficit present.     Mental Status: She is alert and oriented to person, place, and time.  Psychiatric:        Mood and Affect: Mood normal.        Behavior: Behavior normal.        Thought Content: Thought content normal.     DG Chest 2 View  Result Date: 10/28/2020 CLINICAL DATA:  Left lower rib pain with inhalation for 3 days. Continued cough after COVID a month ago. EXAM: CHEST - 2 VIEW COMPARISON:  06/22/2019 FINDINGS: The heart size and mediastinal contours are within normal limits. Both lungs are clear. The visualized skeletal structures are unremarkable. IMPRESSION: No active cardiopulmonary disease. Electronically Signed   By: Lucienne Capers M.D.   On: 10/28/2020 19:49     Assessment and Plan :   PDMP not reviewed this encounter.  1. Rib pain on left side   2. History of COVID-19   3. Moderate persistent asthma without complication   4. Cough     Will use an oral prednisone course. Refilled her albuterol inhaler, nebulized. Use supportive care otherwise. Counseled patient on potential for adverse effects with medications prescribed/recommended today, ER and return-to-clinic precautions discussed, patient verbalized understanding.    Jaynee Eagles, [place] 10/28/20 1953

## 2020-11-08 ENCOUNTER — Ambulatory Visit
Admission: EM | Admit: 2020-11-08 | Discharge: 2020-11-08 | Disposition: A | Discharge status: Home / Self Care | Payer: Medicaid Other | Attending: Family Medicine | Admitting: Family Medicine

## 2020-11-08 ENCOUNTER — Other Ambulatory Visit: Payer: Self-pay

## 2020-11-08 DIAGNOSIS — G4489 Other headache syndrome: Secondary | ICD-10-CM

## 2020-11-08 DIAGNOSIS — I10 Essential (primary) hypertension: Secondary | ICD-10-CM

## 2020-11-08 MED ORDER — BUTALBITAL-APAP-CAFFEINE 50-325-40 MG PO TABS: 1.0000 | ORAL_TABLET | Freq: Three times a day (TID) | ORAL | 0 refills | Status: AC | PRN

## 2020-11-08 NOTE — ED Provider Notes (Signed)
[name]    CSN: [id number] Arrival date & time: 11/08/20  1109      History   Chief Complaint Chief Complaint  Patient presents with  . Headache  . Hypertension    HPI Joyce Welch is a 50 y.o. female.   HPI  Patient presents today with concern of a headache and some dizziness ongoing since the weekend.  Patient reports taking her blood pressure [facility] in obtaining a reading of 140 something over 80 something.  She subsequently took her blood pressure at home today was 141/95.  Patient is taking amlodipine 5 mg once daily and reports adherence with blood pressure medication.  Patient has no history of recurrent headaches.  Blood pressure is within expected range on arrival here today.  Past Medical History:  Diagnosis Date  . Asthma   . Chronic cough   . COVID-19 09/18/2021  . Fibroids   . GERD (gastroesophageal reflux disease)   . Hypertension   . OAB (overactive bladder)     Patient Active Problem List   Diagnosis Date Noted  . Paresthesia 10/17/2018  . Weakness 10/17/2018  . Cough 06/20/2018  . Post-nasal drip 06/20/2018  . Globus sensation 06/20/2018  . Asthma exacerbation 10/31/2017  . Leukocytosis 10/31/2017  . Strain of quadriceps tendon 02/14/2014  . Symptomatic menopausal or female climacteric states 07/31/2013    Past Surgical History:  Procedure Laterality Date  . ABDOMINAL HYSTERECTOMY  02/05/2006   partial  . [place], 2002, 2005,    x 3  . MAXILLARY ANTROSTOMY Right 10/07/2018   Procedure: RIGHT MAXILLARY ANTROSTOMY WITH TISSUE REMOVAL.;  Surgeon: Melida Quitter, MD;  Location: [place];  Service: ENT;  Laterality: Right;    OB History    Gravida  3   Para  3   Term  1   Preterm      AB      Living  3     SAB      IAB      Ectopic      Multiple      Live Births  3            Home Medications    Prior to Admission medications   Medication Sig Start Date End Date  Taking? Authorizing Provider  albuterol (PROVENTIL) (2.5 MG/3ML) 0.083% nebulizer solution Take 3 mLs (2.5 mg total) by nebulization every 6 (six) hours as needed for wheezing or shortness of breath. 10/28/20  Yes Jaynee Eagles, PA-C  albuterol (VENTOLIN HFA) 108 (90 Base) MCG/ACT inhaler Inhale 1 puff into the lungs every 6 (six) hours as needed for wheezing or shortness of breath. 10/28/20  Yes Jaynee Eagles, PA-C  amLODipine (NORVASC) 5 MG tablet Take 5 mg by mouth daily.   Yes [provider]  azelastine (ASTELIN) 0.1 % nasal spray Place 1 spray into both nostrils 2 (two) times daily. Use in each nostril as directed Patient taking differently: Place 1 spray into both nostrils See admin instructions. Instill 1 spray into each nostril two times a day as directed 06/04/18  Yes [name], MD  DULERA 100-5 MCG/ACT AERO INHALE 2 PUFFS INTO THE LUNGS TWICE DAILY 11/15/18  Yes Fenton Foy, NP  fluticasone (FLONASE) 50 MCG/ACT nasal spray Place 2 sprays into both nostrils daily.    Yes [provider]  loratadine (CLARITIN) 10 MG tablet Take 10 mg by mouth daily.   Yes [provider]  pantoprazole (PROTONIX) 40  MG tablet Take 40 mg by mouth every morning. 09/07/19  Yes [provider]  sucralfate (CARAFATE) 1 g tablet Take 1 tablet (1 g total) by mouth 4 (four) times daily. Patient taking differently: Take 1 g by mouth 3 (three) times daily. 06/22/19  Yes Lacretia Leigh, MD  tiZANidine (ZANAFLEX) 4 MG tablet 2 (two) times daily. 05/20/19  Yes [provider]  Vitamin D, Ergocalciferol, (DRISDOL) 1.25 MG (50000 UT) CAPS capsule Take 50,000 Units by mouth once a week. 05/16/19  Yes [provider]  benzonatate (TESSALON) 100 MG capsule Take 1-2 capsules (100-200 mg total) by mouth 3 (three) times daily as needed for cough. 10/28/20   Jaynee Eagles, PA-C  diclofenac (VOLTAREN) 75 MG EC tablet Take 1 tablet (75 mg total) by mouth 2 (two) times daily. 10/06/19    Hudnall, Sharyn Lull, MD  NOREL AD 4-10-325 MG TABS Take 1 tablet by mouth 2 (two) times daily as needed. 02/20/20   [provider]  predniSONE (DELTASONE) 20 MG tablet Take 2 tablets daily with breakfast. 10/28/20   Jaynee Eagles, PA-C  promethazine-dextromethorphan (PROMETHAZINE-DM) 6.25-15 MG/5ML syrup Take 5 mLs by mouth at bedtime as needed for cough. 10/28/20   Jaynee Eagles, PA-C    Family History Family History  Problem Relation Age of Onset  . Hypertension Mother   . Other Father        unsure of medical history  . Breast cancer Maternal Aunt     Social History Social History   Tobacco Use  . Smoking status: Never Smoker  . Smokeless tobacco: Never Used  Vaping Use  . Vaping Use: Never used  Substance Use Topics  . Alcohol use: Yes    Comment: socially  . Drug use: Never     Allergies   Patient has no known allergies.   Review of Systems Review of Systems Pertinent negatives listed in HPI  Physical Exam Triage Vital Signs ED Triage Vitals  Enc Vitals Group     BP 11/08/20 1326 129/82     Pulse Rate 11/08/20 1326 86     Resp 11/08/20 1326 18     Temp 11/08/20 1326 98.1 F (36.7 C)     Temp Source 11/08/20 1326 Oral     SpO2 11/08/20 1326 98 %     Weight 11/08/20 1323 245 lb (111.1 kg)     Height 11/08/20 1323 5\' 6"  (1.676 m)     Head Circumference --      Peak Flow --      Pain Score 11/08/20 1323 0     Pain Loc --      Pain Edu? --      Excl. in [place]? --    No data found.  Updated Vital Signs BP 129/82 (BP Location: Left Arm)   Pulse 86   Temp 98.1 F (36.7 C) (Oral)   Resp 18   Ht 5\' 6"  (1.676 m)   Wt 245 lb (111.1 kg)   SpO2 98%   BMI 39.54 kg/m   Visual Acuity Right Eye Distance:   Left Eye Distance:   Bilateral Distance:    Right Eye Near:   Left Eye Near:    Bilateral Near:     Physical Exam Constitutional:      Appearance: Normal appearance. She is obese.  HENT:     Head: Normocephalic.   Eyes:     General: Lids are  normal.     Extraocular Movements: Extraocular movements intact.  Conjunctiva/sclera: Conjunctivae normal.  Cardiovascular:     Rate and Rhythm: Normal rate and regular rhythm.  Pulmonary:     Effort: Pulmonary effort is normal.  Musculoskeletal:        General: Normal range of motion.  Neurological:     Mental Status: She is alert.     GCS: GCS eye subscore is 4. GCS verbal subscore is 5. GCS motor subscore is 6.     Cranial Nerves: No cranial nerve deficit or facial asymmetry.      UC Treatments / Results  Labs (all labs ordered are listed, but only abnormal results are displayed) Labs Reviewed - No data to display  EKG   Radiology No results found.  Procedures Procedures (including critical care time)  Medications Ordered in UC Medications - No data to display  Initial Impression / Assessment and Plan / UC Course  I have reviewed the triage vital signs and the nursing notes.  Pertinent labs & imaging results that were available during my care of the patient were reviewed by me and considered in my medical decision making (see chart for details).    Patient with a history of hypertension presents today with concern of headache that may be related to elevated blood pressure readings.  Patient's blood pressure stable here today and home readings have only been slightly higher than breathing collected here in clinic today.  Therefore I do not suspect that her headaches are related to blood pressure.  The location of her headache is that of the sinuses and patient is currently prescribed daily antihistamine therapy which she endorses taking consistently suspect headache is more of a result of sinus pressure.  Will trial a course of Fioricet to see if this resolves headache related pain.  Patient educated on how to properly check her blood pressure at home.  She is also advised to follow-up with her primary care provider for further monitoring and evaluation of hypertension.   ER precautions given Final Clinical Impressions(s) / UC Diagnoses   Final diagnoses:  Other headache syndrome  Essential hypertension     Discharge Instructions     Your blood pressure today is stable and your previous recent visits here in urgent care blood pressure readings have been within normal range.  Therefore continue amlodipine as prescribed.  I am treating your headache today with Fioricet you may take 1 tablet every 8 hours as needed for management of headache.  Avoid taking greater than 72 hours consecutively as this can cause a rebound headache.  Also for headache related pain you can resume the tizanidine that you were previously prescribed for neck pain this is also effective in relieving the tension that can cause neck muscle pain and headache pain.    ED Prescriptions    Medication Sig Dispense Auth. Provider   butalbital-acetaminophen-caffeine (FIORICET) 50-325-40 MG tablet Take 1-2 tablets by mouth 3 (three) times daily as needed for headache. 20 tablet Scot Jun, FNP     PDMP not reviewed this encounter.   Scot Jun, FNP 11/10/20 [phone]

## 2020-11-08 NOTE — ED Triage Notes (Signed)
Pt c/o headaches, dizziness since this weekend. Pt also reports she took her BP at [facility] 14?/8?, at home today 141/95. Pt is on Htn meds and states she takes it daily.

## 2020-11-08 NOTE — Discharge Instructions (Addendum)
Your blood pressure today is stable and your previous recent visits here in urgent care blood pressure readings have been within normal range.  Therefore continue amlodipine as prescribed.  I am treating your headache today with Fioricet you may take 1 tablet every 8 hours as needed for management of headache.  Avoid taking greater than 72 hours consecutively as this can cause a rebound headache.  Also for headache related pain you can resume the tizanidine that you were previously prescribed for neck pain this is also effective in relieving the tension that can cause neck muscle pain and headache pain.

## 2020-11-12 ENCOUNTER — Other Ambulatory Visit: Payer: Self-pay | Admitting: Internal Medicine

## 2020-11-13 LAB — COMPLETE METABOLIC PANEL WITH GFR
AG Ratio: 1.4 (calc) (ref 1.0–2.5)
ALT: 20 U/L (ref 6–29)
AST: 27 U/L (ref 10–35)
Albumin: 4 g/dL (ref 3.6–5.1)
Alkaline phosphatase (APISO): 101 U/L (ref 37–153)
BUN: 13 mg/dL (ref 7–25)
CO2: 25 mmol/L (ref 20–32)
Calcium: 9.2 mg/dL (ref 8.6–10.4)
Chloride: 106 mmol/L (ref 98–110)
Creat: 1.03 mg/dL (ref 0.50–1.05)
GFR, Est African American: 73 mL/min/{1.73_m2} (ref >=60)
GFR, Est Non African American: 63 mL/min/{1.73_m2} (ref >=60)
Globulin: 2.9 g/dL (calc) (ref 1.9–3.7)
Glucose, Bld: 80 mg/dL (ref 65–99)
Potassium: 4.5 mmol/L (ref 3.5–5.3)
Sodium: 141 mmol/L (ref 135–146)
Total Bilirubin: 0.2 mg/dL (ref 0.2–1.2)
Total Protein: 6.9 g/dL (ref 6.1–8.1)

## 2020-11-13 LAB — VITAMIN D 25 HYDROXY (VIT D DEFICIENCY, FRACTURES): Vit D, 25-Hydroxy: 61 ng/mL (ref 30–100)

## 2020-11-13 LAB — CBC
HCT: 38.8 % (ref 35.0–45.0)
Hemoglobin: 12.2 g/dL (ref 11.7–15.5)
MCH: 25.5 pg — ABNORMAL LOW (ref 27.0–33.0)
MCHC: 31.4 g/dL — ABNORMAL LOW (ref 32.0–36.0)
MCV: 81.2 fL (ref 80.0–100.0)
MPV: 10.6 fL (ref 7.5–12.5)
Platelets: 272 10*3/uL (ref 140–400)
RBC: 4.78 10*6/uL (ref 3.80–5.10)
RDW: 13 % (ref 11.0–15.0)
WBC: 9.1 10*3/uL (ref 3.8–10.8)

## 2020-11-13 LAB — LIPID PANEL
Cholesterol: 173 mg/dL (ref <200)
HDL: 35 mg/dL — ABNORMAL LOW (ref >=50)
LDL Cholesterol (Calc): 112 mg/dL (calc) — ABNORMAL HIGH
Non-HDL Cholesterol (Calc): 138 mg/dL (calc) — ABNORMAL HIGH (ref <130)
Total CHOL/HDL Ratio: 4.9 (calc) (ref <5.0)
Triglycerides: 147 mg/dL (ref <150)

## 2020-11-13 LAB — TSH: TSH: 1.33 mIU/L

## 2021-04-12 ENCOUNTER — Other Ambulatory Visit: Payer: Self-pay | Admitting: Internal Medicine

## 2021-04-13 ENCOUNTER — Ambulatory Visit: Payer: Medicaid Other

## 2021-04-13 LAB — URINE CULTURE
MICRO NUMBER:: 12109149
SPECIMEN QUALITY:: ADEQUATE

## 2021-04-16 ENCOUNTER — Other Ambulatory Visit: Payer: Self-pay

## 2021-04-16 ENCOUNTER — Encounter: Payer: Self-pay | Admitting: Emergency Medicine

## 2021-04-16 ENCOUNTER — Ambulatory Visit
Admission: EM | Admit: 2021-04-16 | Discharge: 2021-04-16 | Disposition: A | Discharge status: Home / Self Care | Payer: Medicaid Other | Attending: Family Medicine | Admitting: Family Medicine

## 2021-04-16 ENCOUNTER — Ambulatory Visit (INDEPENDENT_AMBULATORY_CARE_PROVIDER_SITE_OTHER): Payer: Medicaid Other

## 2021-04-16 DIAGNOSIS — M51369 Other intervertebral disc degeneration, lumbar region without mention of lumbar back pain or lower extremity pain: Secondary | ICD-10-CM

## 2021-04-16 DIAGNOSIS — M5136 Other intervertebral disc degeneration, lumbar region: Secondary | ICD-10-CM | POA: Diagnosis not present

## 2021-04-16 DIAGNOSIS — R350 Frequency of micturition: Secondary | ICD-10-CM | POA: Diagnosis not present

## 2021-04-16 DIAGNOSIS — R109 Unspecified abdominal pain: Secondary | ICD-10-CM

## 2021-04-16 LAB — POCT URINALYSIS DIP (MANUAL ENTRY)
Bilirubin, UA: NEGATIVE
Blood, UA: NEGATIVE
Glucose, UA: NEGATIVE mg/dL
Ketones, POC UA: NEGATIVE mg/dL
Leukocytes, UA: NEGATIVE
Nitrite, UA: NEGATIVE
Protein Ur, POC: NEGATIVE mg/dL
Spec Grav, UA: 1.015 (ref 1.010–1.025)
Urobilinogen, UA: 0.2 E.U./dL
pH, UA: 7 (ref 5.0–8.0)

## 2021-04-16 MED ORDER — KETOROLAC TROMETHAMINE 30 MG/ML IJ SOLN
30.0000 mg | Freq: Once | INTRAMUSCULAR | Status: AC
  Administered 2021-04-16: 30 mg via INTRAMUSCULAR

## 2021-04-16 MED ORDER — PREDNISONE 20 MG PO TABS: 40.0000 mg | ORAL_TABLET | Freq: Every day | ORAL | 0 refills | Status: AC

## 2021-04-16 NOTE — Discharge Instructions (Addendum)
Urology referral placed for further work up of overactive bladder.  Someone from Urology will contact you to schedule an appointment. Start prednisone tomorrow Complete antibiotics

## 2021-04-16 NOTE — ED Provider Notes (Signed)
[facility]    CSN: [id number] Arrival date & time: 04/16/21  1212      History   Chief Complaint Chief Complaint  Patient presents with   Urinary Frequency   Back Pain    HPI Sherine Cortese is a 50 y.o. female.   HPI Patient presents today concern for possible urinary tract infection. Seen by PCP, started on Macrobid, continues to have left lower back pain and urinary frequency. Denies dysuria. In review of EMR patient has history of over active bladder. She has not seen a urologist and endorses bladder issues are worsening. Recent urine culture was negative for bacterial growth. UA here today is clinic is normal. No history of renal stones. No hematuria, fever,  Past Medical History:  Diagnosis Date   Asthma    Chronic cough    COVID-19 09/18/2021   Fibroids    GERD (gastroesophageal reflux disease)    Hypertension    OAB (overactive bladder)     Patient Active Problem List   Diagnosis Date Noted   Paresthesia 10/17/2018   Weakness 10/17/2018   Cough 06/20/2018   Post-nasal drip 06/20/2018   Globus sensation 06/20/2018   Asthma exacerbation 10/31/2017   Leukocytosis 10/31/2017   Strain of quadriceps tendon 02/14/2014   Symptomatic menopausal or female climacteric states 07/31/2013    Past Surgical History:  Procedure Laterality Date   ABDOMINAL HYSTERECTOMY  02/05/2006   partial   [place], 2002, 2005,    x 3   MAXILLARY ANTROSTOMY Right 10/07/2018   Procedure: RIGHT MAXILLARY ANTROSTOMY WITH TISSUE REMOVAL.;  Surgeon: Melida Quitter, MD;  Location: [place];  Service: ENT;  Laterality: Right;    OB History     Gravida  3   Para  3   Term  1   Preterm      AB      Living  3      SAB      IAB      Ectopic      Multiple      Live Births  3            Home Medications    Prior to Admission medications   Medication Sig Start Date End Date Taking? Authorizing Provider  albuterol  (PROVENTIL) (2.5 MG/3ML) 0.083% nebulizer solution Take 3 mLs (2.5 mg total) by nebulization every 6 (six) hours as needed for wheezing or shortness of breath. 10/28/20   Jaynee Eagles, PA-C  albuterol (VENTOLIN HFA) 108 (90 Base) MCG/ACT inhaler Inhale 1 puff into the lungs every 6 (six) hours as needed for wheezing or shortness of breath. 10/28/20   Jaynee Eagles, PA-C  amLODipine (NORVASC) 5 MG tablet Take 5 mg by mouth daily.    [provider]  azelastine (ASTELIN) 0.1 % nasal spray Place 1 spray into both nostrils 2 (two) times daily. Use in each nostril as directed Patient taking differently: Place 1 spray into both nostrils See admin instructions. Instill 1 spray into each nostril two times a day as directed 06/04/18   [name], MD  benzonatate (TESSALON) 100 MG capsule Take 1-2 capsules (100-200 mg total) by mouth 3 (three) times daily as needed for cough. 10/28/20   Jaynee Eagles, PA-C  butalbital-acetaminophen-caffeine (FIORICET) [phone] MG tablet Take 1-2 tablets by mouth 3 (three) times daily as needed for headache. 11/08/20 11/08/21  Scot Jun, FNP  diclofenac (VOLTAREN) 75 MG EC tablet Take 1 tablet (75 mg total)  by mouth 2 (two) times daily. 10/06/19   Dene Gentry, MD  DULERA 100-5 MCG/ACT AERO INHALE 2 PUFFS INTO THE LUNGS TWICE DAILY 11/15/18   Fenton Foy, NP  fluticasone (FLONASE) 50 MCG/ACT nasal spray Place 2 sprays into both nostrils daily.     [provider]  loratadine (CLARITIN) 10 MG tablet Take 10 mg by mouth daily.    [provider]  NOREL AD 4-10-325 MG TABS Take 1 tablet by mouth 2 (two) times daily as needed. 02/20/20   [provider]  pantoprazole (PROTONIX) 40 MG tablet Take 40 mg by mouth every morning. 09/07/19   [provider]  predniSONE (DELTASONE) 20 MG tablet Take 2 tablets daily with breakfast. 10/28/20   Jaynee Eagles, PA-C  promethazine-dextromethorphan (PROMETHAZINE-DM) 6.25-15 MG/5ML syrup Take 5 mLs by  mouth at bedtime as needed for cough. 10/28/20   Jaynee Eagles, PA-C  sucralfate (CARAFATE) 1 g tablet Take 1 tablet (1 g total) by mouth 4 (four) times daily. Patient taking differently: Take 1 g by mouth 3 (three) times daily. 06/22/19   Lacretia Leigh, MD  tiZANidine (ZANAFLEX) 4 MG tablet 2 (two) times daily. 05/20/19   [provider]  Vitamin D, Ergocalciferol, (DRISDOL) 1.25 MG (50000 UT) CAPS capsule Take 50,000 Units by mouth once a week. 05/16/19   [provider]    Family History Family History  Problem Relation Age of Onset   Hypertension Mother    Other Father        unsure of medical history   Breast cancer Maternal Aunt     Social History Social History   Tobacco Use   Smoking status: Never   Smokeless tobacco: Never  Vaping Use   Vaping Use: Never used  Substance Use Topics   Alcohol use: Yes    Comment: socially   Drug use: Never     Allergies   Patient has no known allergies.   Review of Systems Review of Systems Pertinent negatives listed in HPI   Physical Exam Triage Vital Signs ED Triage Vitals  Enc Vitals Group     BP 04/16/21 1455 (!) 128/93     Pulse Rate 04/16/21 1455 98     Resp 04/16/21 1455 18     Temp 04/16/21 1455 98.4 F (36.9 C)     Temp Source 04/16/21 1455 Oral     SpO2 04/16/21 1455 99 %     Weight --      Height --      Head Circumference --      Peak Flow --      Pain Score 04/16/21 1454 7     Pain Loc --      Pain Edu? --      Excl. in [place]? --    No data found.  Updated Vital Signs BP (!) 128/93   Pulse 98   Temp 98.4 F (36.9 C) (Oral)   Resp 18   SpO2 99%   Visual Acuity Right Eye Distance:   Left Eye Distance:   Bilateral Distance:    Right Eye Near:   Left Eye Near:    Bilateral Near:     Physical Exam General appearance: alert, well developed, well nourished, cooperative Head: Normocephalic, without obvious abnormality, atraumatic Respiratory: Respirations even and unlabored, normal  respiratory rate Heart: Rate and rhythm normal.  Abdomen: Epigastric tenderness (left) , normal bowel sounds, no rebound  Back: Left lower lumbar palpable tenderness , no mass, no  bulge  Skin: Skin color, texture, turgor normal. No rashes seen  Psych: Appropriate mood and affect. Neurologic: GCS 15, normal coordination, normal gait  UC Treatments / Results  Labs (all labs ordered are listed, but only abnormal results are displayed) Labs Reviewed  POCT URINALYSIS DIP (MANUAL ENTRY)    EKG   Radiology No results found.  Procedures Procedures (including critical care time)  Medications Ordered in UC Medications  ketorolac (TORADOL) 30 MG/ML injection 30 mg (30 mg Intramuscular Given 04/16/21 1625)    Initial Impression / Assessment and Plan / UC Course  I have reviewed the triage vital signs and the nursing notes.  Pertinent labs & imaging results that were available during my care of the patient were reviewed by me and considered in my medical decision making (see chart for details).    Left flank pain (DG ABD negative for renal stone), revealed DDD lower lumbar and bilateral hips-suspect this is the source of back pain. Treatment with Toradol IM here today and will send home on a short burst of prednisone. Continue antibiotics as previously prescribed. Urology referral placed for evaluation of OAB. Final Clinical Impressions(s) / UC Diagnoses   Final diagnoses:  Left flank pain  Urine frequency  DDD (degenerative disc disease), lumbar   Discharge Instructions   None    ED Prescriptions     Medication Sig Dispense Auth. Provider   predniSONE (DELTASONE) 20 MG tablet Take 2 tablets (40 mg total) by mouth daily with breakfast for 5 days. 10 tablet Scot Jun, FNP      PDMP not reviewed this encounter.   Scot Jun, [place] 04/18/21 [phone]

## 2021-04-16 NOTE — ED Triage Notes (Signed)
PT saw her PCP Tuesday and was diagnosed with a UTI. Taking macrobid.   Frequency is worse, left flank pain now present - radiates to LUQ. Urgency now present - has to rush to restroom

## 2021-04-23 ENCOUNTER — Other Ambulatory Visit: Payer: Self-pay

## 2021-04-23 ENCOUNTER — Emergency Department (HOSPITAL_COMMUNITY)
Admission: EM | Admit: 2021-04-23 | Discharge: 2021-04-24 | Disposition: A | Discharge status: Home / Self Care | Payer: Medicaid Other | Attending: Emergency Medicine | Admitting: Emergency Medicine

## 2021-04-23 ENCOUNTER — Emergency Department (HOSPITAL_COMMUNITY): Payer: Medicaid Other

## 2021-04-23 DIAGNOSIS — M546 Pain in thoracic spine: Secondary | ICD-10-CM | POA: Diagnosis not present

## 2021-04-23 DIAGNOSIS — K219 Gastro-esophageal reflux disease without esophagitis: Secondary | ICD-10-CM | POA: Diagnosis not present

## 2021-04-23 DIAGNOSIS — R109 Unspecified abdominal pain: Secondary | ICD-10-CM | POA: Insufficient documentation

## 2021-04-23 DIAGNOSIS — I10 Essential (primary) hypertension: Secondary | ICD-10-CM | POA: Diagnosis not present

## 2021-04-23 DIAGNOSIS — Z8616 Personal history of COVID-19: Secondary | ICD-10-CM | POA: Diagnosis not present

## 2021-04-23 DIAGNOSIS — J45909 Unspecified asthma, uncomplicated: Secondary | ICD-10-CM | POA: Diagnosis not present

## 2021-04-23 DIAGNOSIS — Z79899 Other long term (current) drug therapy: Secondary | ICD-10-CM | POA: Diagnosis not present

## 2021-04-23 DIAGNOSIS — M545 Low back pain, unspecified: Secondary | ICD-10-CM | POA: Diagnosis not present

## 2021-04-23 DIAGNOSIS — R3981 Functional urinary incontinence: Secondary | ICD-10-CM | POA: Insufficient documentation

## 2021-04-23 DIAGNOSIS — R350 Frequency of micturition: Secondary | ICD-10-CM | POA: Insufficient documentation

## 2021-04-23 DIAGNOSIS — Z7951 Long term (current) use of inhaled steroids: Secondary | ICD-10-CM | POA: Diagnosis not present

## 2021-04-23 DIAGNOSIS — M5135 Other intervertebral disc degeneration, thoracolumbar region: Secondary | ICD-10-CM

## 2021-04-23 LAB — CBC WITH DIFFERENTIAL/PLATELET
Abs Immature Granulocytes: 0.05 10*3/uL (ref 0.00–0.07)
Basophils Absolute: 0.1 10*3/uL (ref 0.0–0.1)
Basophils Relative: 1 %
Eosinophils Absolute: 0.1 10*3/uL (ref 0.0–0.5)
Eosinophils Relative: 1 %
HCT: 40.1 % (ref 36.0–46.0)
Hemoglobin: 12.1 g/dL (ref 12.0–15.0)
Immature Granulocytes: 0 %
Lymphocytes Relative: 32 %
Lymphs Abs: 3.7 10*3/uL (ref 0.7–4.0)
MCH: 25.2 pg — ABNORMAL LOW (ref 26.0–34.0)
MCHC: 30.2 g/dL (ref 30.0–36.0)
MCV: 83.4 fL (ref 80.0–100.0)
Monocytes Absolute: 0.7 10*3/uL (ref 0.1–1.0)
Monocytes Relative: 6 %
Neutro Abs: 7.1 10*3/uL (ref 1.7–7.7)
Neutrophils Relative %: 60 %
Platelets: 255 10*3/uL (ref 150–400)
RBC: 4.81 MIL/uL (ref 3.87–5.11)
RDW: 13.3 % (ref 11.5–15.5)
WBC: 11.7 10*3/uL — ABNORMAL HIGH (ref 4.0–10.5)
nRBC: 0 % (ref 0.0–0.2)

## 2021-04-23 LAB — URINALYSIS, ROUTINE W REFLEX MICROSCOPIC
Bilirubin Urine: NEGATIVE
Glucose, UA: NEGATIVE mg/dL
Ketones, ur: NEGATIVE mg/dL
Leukocytes,Ua: NEGATIVE
Nitrite: NEGATIVE
Protein, ur: NEGATIVE mg/dL
Specific Gravity, Urine: 1.016 (ref 1.005–1.030)
pH: 5 (ref 5.0–8.0)

## 2021-04-23 LAB — BASIC METABOLIC PANEL
Anion gap: 5 (ref 5–15)
BUN: 14 mg/dL (ref 6–20)
CO2: 26 mmol/L (ref 22–32)
Calcium: 9 mg/dL (ref 8.9–10.3)
Chloride: 107 mmol/L (ref 98–111)
Creatinine, Ser: 0.92 mg/dL (ref 0.44–1.00)
GFR, Estimated: >60 mL/min (ref >60)
Glucose, Bld: 88 mg/dL (ref 70–99)
Potassium: 4.2 mmol/L (ref 3.5–5.1)
Sodium: 138 mmol/L (ref 135–145)

## 2021-04-23 MED ORDER — GADOBUTROL 1 MMOL/ML IV SOLN
10.0000 mL | Freq: Once | INTRAVENOUS | Status: AC | PRN
  Administered 2021-04-23: 10 mL via INTRAVENOUS

## 2021-04-23 MED ORDER — METHOCARBAMOL 500 MG PO TABS
500.0000 mg | ORAL_TABLET | Freq: Once | ORAL | Status: AC
  Administered 2021-04-23: 500 mg via ORAL
  Filled 2021-04-23: qty 1

## 2021-04-23 MED ORDER — KETOROLAC TROMETHAMINE 15 MG/ML IJ SOLN
15.0000 mg | Freq: Once | INTRAMUSCULAR | Status: AC
  Administered 2021-04-23: 15 mg via INTRAVENOUS
  Filled 2021-04-23: qty 1

## 2021-04-23 MED ORDER — HYDROCODONE-ACETAMINOPHEN 5-325 MG PO TABS
1.0000 | ORAL_TABLET | Freq: Once | ORAL | Status: AC
  Administered 2021-04-23: 1 via ORAL
  Filled 2021-04-23: qty 1

## 2021-04-23 MED ORDER — LORAZEPAM 2 MG/ML IJ SOLN
0.5000 mg | Freq: Once | INTRAMUSCULAR | Status: AC
  Administered 2021-04-23: 0.5 mg via INTRAVENOUS
  Filled 2021-04-23: qty 1

## 2021-04-23 NOTE — ED Notes (Signed)
Pt to MRI

## 2021-04-23 NOTE — ED Triage Notes (Signed)
Pt with R lower back pain without injury > 1 week. Seen at [facility] and sent here for further eval. Dx with UTI and has finished abx. Using topical cream and muscle relaxer without improvement.

## 2021-04-23 NOTE — ED Provider Notes (Signed)
Emergency Medicine Provider Triage Evaluation Note  [facility] , a 50 y.o. female  was evaluated in triage.  Pt complains of left-sided back/flank pain.  Patient states she has had pain for about a week.  Is gradually worsening.  She was treated for a UTI, reports her only urinary symptom at this point is urinary frequency.  She has been taking the prescribed medication for this without improvement, including muscle creams, she tried a steroid but was unable to tolerate palpitations, and muscle relaxer.  Review of Systems  Positive: Back/flank pain Negative: fever  Physical Exam  BP 122/74   Pulse 76   Temp 97.9 F (36.6 C) (Oral)   Resp 16   SpO2 99%  Gen:   Awake, no distress   Resp:  Normal effort  MSK:   Moves extremities without difficulty.  Tenderness palpation of left side back including over the CVA, but also the low back musculature.  No pain over midline spine.   Medical Decision Making  Medically screening exam initiated at 10:38 AM.  Appropriate orders placed.  [facility] was informed that the remainder of the evaluation will be completed by another provider, this initial triage assessment does not replace that evaluation, and the importance of remaining in the ED until their evaluation is complete.  Labs, ua, and ct to r/o pyelo vs kidney stone vs mks causere   Joyce Heidelberg, PA-C 04/23/21 1040    Tegeler, Gwenyth Allegra, MD 04/23/21 1601

## 2021-04-23 NOTE — ED Provider Notes (Signed)
[facility] EMERGENCY DEPARTMENT Provider Note   CSN: [id number] Arrival date & time: 04/23/21  1013     History Chief Complaint  Patient presents with   Back Pain    [facility] is a 50 y.o. female.  Patient is a 50 year old female with a history of hypertension, GERD who is presenting today with persistent left-sided back pain.  Patient initially saw her doctor 2 weeks ago for this at the time she was having a little bit of urinary frequency but denied any dysuria, fever, hematuria.  At that time she did a urine sample and her doctor started her on Macrobid.  However the culture grew back no significant bacteria.  She was seen on 04/12/2021 due to the ongoing pain and at that time was instructed to continue her antibiotic and was given NSAIDs.  She then saw her doctor again on 04/16/2021 because the pain in her left side was not getting any better and she was having worsening very urinary frequency and now more prominent incontinence that made her wear a pad because she was starting to smell of urine.  She was given prednisone, NSAIDs and Voltaren gel and follow-up with urology.  She saw the urologist yesterday and was given pills for overactive bladder which have decreased the amount that she is urinating but she still had occasional incontinence.  She continues to have this stabbing knifelike pain that comes and goes in the left side of her back that is worse with certain positions and twisting.  She did take tizanidine which was given by her doctor which she reports may have helped but made her so sleepy that she just went straight to bed and by the time she woke up in the morning then the pain would come back.  It occasionally radiates down into her left buttocks but has never gone down her leg.  She has not had any weakness or numbness in her lower extremities.  She has had some constipation but no bowel incontinence.  Denies any trauma or symptoms similar to this in  the past.  No history of recurrent UTIs, kidney stones or other urinary issues.  The history is provided by the patient.  Back Pain Location:  Thoracic spine and lumbar spine Quality:  Cramping, shooting and stiffness Stiffness is present:  All day Radiates to: mild radiation to the left buttocks but not down the leg. Pain severity:  Moderate Pain is:  Same all the time Onset quality:  Gradual Duration:  2 weeks Timing:  Constant Progression:  Worsening Chronicity:  New Relieved by: better with being still. Worsened by:  Twisting (certain positions) Ineffective treatments:  Ibuprofen, heating pad and OTC medications Associated symptoms: abdominal pain and bladder incontinence   Associated symptoms: no abdominal swelling, no bowel incontinence, no leg pain, no numbness, no paresthesias and no weakness   Risk factors: obesity   Risk factors comment:  Htn, GERD     Past Medical History:  Diagnosis Date   Asthma    Chronic cough    COVID-19 09/18/2021   Fibroids    GERD (gastroesophageal reflux disease)    Hypertension    OAB (overactive bladder)     Patient Active Problem List   Diagnosis Date Noted   Paresthesia 10/17/2018   Weakness 10/17/2018   Cough 06/20/2018   Post-nasal drip 06/20/2018   Globus sensation 06/20/2018   Asthma exacerbation 10/31/2017   Leukocytosis 10/31/2017   Strain of quadriceps tendon 02/14/2014   Symptomatic  menopausal or female climacteric states 07/31/2013    Past Surgical History:  Procedure Laterality Date   ABDOMINAL HYSTERECTOMY  02/05/2006   partial   CESAREAN SECTION  1994, 2002, 2005,    x 3   MAXILLARY ANTROSTOMY Right 10/07/2018   Procedure: RIGHT MAXILLARY ANTROSTOMY WITH TISSUE REMOVAL.;  Surgeon: Melida Quitter, MD;  Location: [place];  Service: ENT;  Laterality: Right;     OB History     Gravida  3   Para  3   Term  1   Preterm      AB      Living  3      SAB      IAB      Ectopic       Multiple      Live Births  3           Family History  Problem Relation Age of Onset   Hypertension Mother    Other Father        unsure of medical history   Breast cancer Maternal Aunt     Social History   Tobacco Use   Smoking status: Never   Smokeless tobacco: Never  Vaping Use   Vaping Use: Never used  Substance Use Topics   Alcohol use: Yes    Comment: socially   Drug use: Never    Home Medications Prior to Admission medications   Medication Sig Start Date End Date Taking? Authorizing Provider  albuterol (PROVENTIL) (2.5 MG/3ML) 0.083% nebulizer solution Take 3 mLs (2.5 mg total) by nebulization every 6 (six) hours as needed for wheezing or shortness of breath. 10/28/20   Jaynee Eagles, PA-C  albuterol (VENTOLIN HFA) 108 (90 Base) MCG/ACT inhaler Inhale 1 puff into the lungs every 6 (six) hours as needed for wheezing or shortness of breath. 10/28/20   Jaynee Eagles, PA-C  amLODipine (NORVASC) 5 MG tablet Take 5 mg by mouth daily.    [provider]  azelastine (ASTELIN) 0.1 % nasal spray Place 1 spray into both nostrils 2 (two) times daily. Use in each nostril as directed Patient taking differently: Place 1 spray into both nostrils See admin instructions. Instill 1 spray into each nostril two times a day as directed 06/04/18   [name], MD  benzonatate (TESSALON) 100 MG capsule Take 1-2 capsules (100-200 mg total) by mouth 3 (three) times daily as needed for cough. 10/28/20   Jaynee Eagles, PA-C  butalbital-acetaminophen-caffeine (FIORICET) [phone] MG tablet Take 1-2 tablets by mouth 3 (three) times daily as needed for headache. 11/08/20 11/08/21  Scot Jun, FNP  diclofenac (VOLTAREN) 75 MG EC tablet Take 1 tablet (75 mg total) by mouth 2 (two) times daily. 10/06/19   Dene Gentry, MD  DULERA 100-5 MCG/ACT AERO INHALE 2 PUFFS INTO THE LUNGS TWICE DAILY 11/15/18   Fenton Foy, NP  fluticasone (FLONASE) 50 MCG/ACT nasal spray Place 2 sprays into both  nostrils daily.     [provider]  loratadine (CLARITIN) 10 MG tablet Take 10 mg by mouth daily.    [provider]  NOREL AD 4-10-325 MG TABS Take 1 tablet by mouth 2 (two) times daily as needed. 02/20/20   [provider]  pantoprazole (PROTONIX) 40 MG tablet Take 40 mg by mouth every morning. 09/07/19   [provider]  promethazine-dextromethorphan (PROMETHAZINE-DM) 6.25-15 MG/5ML syrup Take 5 mLs by mouth at bedtime as needed for cough. 10/28/20   Jaynee Eagles, PA-C  sucralfate (CARAFATE) 1 g tablet Take 1 tablet (1 g total) by mouth 4 (four) times daily. Patient taking differently: Take 1 g by mouth 3 (three) times daily. 06/22/19   Lacretia Leigh, MD  tiZANidine (ZANAFLEX) 4 MG tablet 2 (two) times daily. 05/20/19   [provider]  Vitamin D, Ergocalciferol, (DRISDOL) 1.25 MG (50000 UT) CAPS capsule Take 50,000 Units by mouth once a week. 05/16/19   [provider]    Allergies    Patient has no known allergies.  Review of Systems   Review of Systems  Gastrointestinal:  Positive for abdominal pain. Negative for bowel incontinence.  Genitourinary:  Positive for bladder incontinence.  Musculoskeletal:  Positive for back pain.  Neurological:  Negative for weakness, numbness and paresthesias.  All other systems reviewed and are negative.  Physical Exam Updated Vital Signs BP 137/65   Pulse 86   Temp 98.7 F (37.1 C) (Oral)   Resp 18   SpO2 100%   Physical Exam Vitals and nursing note reviewed.  Constitutional:      General: She is not in acute distress.    Appearance: Normal appearance. She is well-developed.  HENT:     Head: Normocephalic and atraumatic.     Mouth/Throat:     Mouth: Mucous membranes are moist.  Eyes:     Pupils: Pupils are equal, round, and reactive to light.  Cardiovascular:     Rate and Rhythm: Normal rate and regular rhythm.     Pulses: Normal pulses.     Heart sounds: Normal heart sounds. No murmur  heard.   No friction rub.  Pulmonary:     Effort: Pulmonary effort is normal.     Breath sounds: Normal breath sounds. No wheezing or rales.  Abdominal:     General: Bowel sounds are normal. There is no distension.     Palpations: Abdomen is soft.     Tenderness: There is no abdominal tenderness. There is no guarding or rebound.  Musculoskeletal:        General: No tenderness. Normal range of motion.     Cervical back: Normal range of motion and neck supple.       Back:     Right lower leg: No edema.     Left lower leg: No edema.     Comments: No edema  Skin:    General: Skin is warm and dry.     Capillary Refill: Capillary refill takes less than 2 seconds.     Findings: No rash.  Neurological:     Mental Status: She is alert and oriented to person, place, and time. Mental status is at baseline.     Cranial Nerves: No cranial nerve deficit.     Sensory: No sensory deficit.     Motor: No weakness.     Comments: Normal lower ext strength bilaterally.  No foot drop.  Normal sensation.  Normal reflexes  Psychiatric:        Mood and Affect: Mood normal.        Behavior: Behavior normal.        Thought Content: Thought content normal.    ED Results / Procedures / Treatments   Labs (all labs ordered are listed, but only abnormal results are displayed) Labs Reviewed  URINALYSIS, ROUTINE W REFLEX MICROSCOPIC - Abnormal; Notable for the following components:      Result Value   Hgb urine dipstick SMALL (*)    Bacteria, UA RARE (*)    All other  components within normal limits  CBC WITH DIFFERENTIAL/PLATELET - Abnormal; Notable for the following components:   WBC 11.7 (*)    MCH 25.2 (*)    All other components within normal limits  BASIC METABOLIC PANEL    EKG None  Radiology CT Renal Stone Study  Result Date: 04/23/2021 CLINICAL DATA:  50 year old with flank pain, kidney stone suspected. EXAM: CT ABDOMEN AND PELVIS WITHOUT CONTRAST TECHNIQUE: Multidetector CT imaging of  the abdomen and pelvis was performed following the standard protocol without IV contrast. COMPARISON:  None. FINDINGS: Lower chest: Nodule along the right major fissure on sequence 4, image 9 with a mean diameter of 5 mm based on the sagittal reformats. Additional 3 mm pleural-based nodule at the right lung base on sequence 4, image 10. 4 mm pleural-based nodule at the left lung base on sequence 4, image 14. Additional small poorly defined nodule in the medial left lower lobe on sequence 4, image 10 measures to 5 mm. Hepatobiliary: Normal appearance of the liver and gallbladder. No significant biliary dilatation. Pancreas: Unremarkable. No pancreatic ductal dilatation or surrounding inflammatory changes. Spleen: Normal in size without focal abnormality. Adrenals/Urinary Tract: Normal adrenal glands. Normal appearance of the urinary bladder. Normal appearance of both kidneys without stones or hydronephrosis. No suspicious renal lesions. Stomach/Bowel: Evidence for a small normal appendix. No evidence for bowel obstruction or focal bowel inflammation. Vascular/Lymphatic: No significant vascular findings are present. No enlarged abdominal or pelvic lymph nodes. Reproductive: Status post hysterectomy. No adnexal masses. Other: Negative for ascites. Tiny umbilical hernia containing fat. Negative for free air. Musculoskeletal: Prominent disc osteophyte complexes at T11-T12 and T12-L1. These large disc osteophyte complexes causing narrowing of the central canal. IMPRESSION: 1. No acute abnormality in the abdomen or pelvis. Specifically, no evidence for kidney stones or hydronephrosis. 2. Prominent disc osteophyte complexes causing narrowing of the central canal at T11 through L1. 3. Small indeterminate nodules at the lung bases. Largest nodule has a mean diameter of 5 mm. No follow-up needed if patient is low-risk (and has no known or suspected primary neoplasm). Non-contrast chest CT can be considered in 12 months if  patient is high-risk. This recommendation follows the consensus statement: Guidelines for Management of Incidental Pulmonary Nodules Detected on CT Images: From the Fleischner Society 2017; Radiology 2017; 284:228-243. Electronically Signed   By: Markus Daft M.D.   On: 04/23/2021 11:29    Procedures Procedures   Medications Ordered in ED Medications  methocarbamol (ROBAXIN) tablet 500 mg (has no administration in time range)  LORazepam (ATIVAN) injection 0.5 mg (has no administration in time range)  ketorolac (TORADOL) 15 MG/ML injection 15 mg (has no administration in time range)  HYDROcodone-acetaminophen (NORCO/VICODIN) 5-325 MG per tablet 1 tablet (1 tablet Oral Given 04/23/21 1046)    ED Course  I have reviewed the triage vital signs and the nursing notes.  Pertinent labs & imaging results that were available during my care of the patient were reviewed by me and considered in my medical decision making (see chart for details).    MDM Rules/Calculators/A&P                           Patient is a 50 year old female presenting today with left-sided lower thoracic upper lumbar pain and urinary frequency and incontinence has been worsening over the last 2 to 3 weeks.  Patient initially felt to have a UTI and was given a course of Macrobid but reports symptoms have  not changed.  She denies any dysuria, fever.  Urine today without evidence of infection.  CBC with mild leukocytosis of 11 but otherwise normal and CMP without acute findings.  Patient had a renal CT that showed osteophytes at L T11 and L1 and reports of degenerative disc disease but no other acute findings at this time.  Patient has been using NSAIDs was given tizanidine but reports it makes her too tired and was given a course of prednisone but that caused heart palpitations and she could only take 1 dose and then was advised to stop.  She has no prior history of significant regular back pain.  However with her new history of back  pain it seems more musculoskeletal or thoracic or lumbar in nature.  Low suspicion for acute renal process, acute abdominal process or an acute urinary process.  Concern for possible cord compression with her new urinary symptoms.  She has no bowel incontinence at this time.  Feel that patient will need an MRI to further evaluate and rule out cauda equina.  She was given a dose of Toradol and Robaxin.  Ativan for antianxiety for the MRI.  Final Clinical Impression(s) / ED Diagnoses Final diagnoses:  None    Rx / DC Orders ED Discharge Orders     None        Blanchie Dessert, MD 04/24/21 0008

## 2021-04-24 NOTE — ED Notes (Signed)
Patient verbalizes understanding of discharge instructions. Opportunity for questioning and answers were provided. Armband removed by staff, pt discharged from ED ambulatory.   

## 2021-05-02 ENCOUNTER — Ambulatory Visit: Payer: Self-pay | Admitting: Urology

## 2021-05-29 ENCOUNTER — Telehealth: Payer: Medicaid Other | Admitting: Nurse Practitioner

## 2021-05-29 DIAGNOSIS — B9689 Other specified bacterial agents as the cause of diseases classified elsewhere: Secondary | ICD-10-CM | POA: Diagnosis not present

## 2021-05-29 DIAGNOSIS — J329 Chronic sinusitis, unspecified: Secondary | ICD-10-CM | POA: Diagnosis not present

## 2021-05-29 MED ORDER — AMOXICILLIN-POT CLAVULANATE 875-125 MG PO TABS: 1.0000 | ORAL_TABLET | Freq: Two times a day (BID) | ORAL | 0 refills | Status: DC

## 2021-05-29 NOTE — Patient Instructions (Signed)
Joyce Welch, thank you for joining Reynolds Bowl, NP for today's virtual visit.  While this provider is not your primary care provider (PCP), if your PCP is located in our provider database this encounter information will be shared with them immediately following your visit.  Consent: (Patient) Joyce Welch provided verbal consent for this virtual visit at the beginning of the encounter.  Current Medications:  Current Outpatient Medications:    albuterol (PROVENTIL) (2.5 MG/3ML) 0.083% nebulizer solution, Take 3 mLs (2.5 mg total) by nebulization every 6 (six) hours as needed for wheezing or shortness of breath., Disp: 75 mL, Rfl: 0   albuterol (VENTOLIN HFA) 108 (90 Base) MCG/ACT inhaler, Inhale 1 puff into the lungs every 6 (six) hours as needed for wheezing or shortness of breath., Disp: 18 g, Rfl: 0   amLODipine (NORVASC) 5 MG tablet, Take 5 mg by mouth daily., Disp: , Rfl:    amoxicillin-clavulanate (AUGMENTIN) 875-125 MG tablet, Take 1 tablet by mouth 2 (two) times daily., Disp: 20 tablet, Rfl: 0   azelastine (ASTELIN) 0.1 % nasal spray, Place 1 spray into both nostrils 2 (two) times daily. Use in each nostril as directed (Patient taking differently: Place 1 spray into both nostrils See admin instructions. Instill 1 spray into each nostril two times a day as directed), Disp: 30 mL, Rfl: 12   diclofenac (VOLTAREN) 75 MG EC tablet, Take 1 tablet (75 mg total) by mouth 2 (two) times daily., Disp: 60 tablet, Rfl: 1   fluticasone (FLONASE) 50 MCG/ACT nasal spray, Place 2 sprays into both nostrils daily. , Disp: , Rfl:    pantoprazole (PROTONIX) 40 MG tablet, Take 40 mg by mouth every morning., Disp: , Rfl:    sucralfate (CARAFATE) 1 g tablet, Take 1 tablet (1 g total) by mouth 4 (four) times daily., Disp: 30 tablet, Rfl: 0   tiZANidine (ZANAFLEX) 4 MG tablet, 2 (two) times daily., Disp: , Rfl:    Vitamin D, Ergocalciferol, (DRISDOL) 1.25 MG (50000 UT) CAPS capsule, Take 50,000  Units by mouth once a week., Disp: , Rfl:    benzonatate (TESSALON) 100 MG capsule, Take 1-2 capsules (100-200 mg total) by mouth 3 (three) times daily as needed for cough. (Patient not taking: Reported on 05/29/2021), Disp: 60 capsule, Rfl: 0   butalbital-acetaminophen-caffeine (FIORICET) 50-325-40 MG tablet, Take 1-2 tablets by mouth 3 (three) times daily as needed for headache., Disp: 20 tablet, Rfl: 0   DULERA 100-5 MCG/ACT AERO, INHALE 2 PUFFS INTO THE LUNGS TWICE DAILY, Disp: 13 g, Rfl: 3   loratadine (CLARITIN) 10 MG tablet, Take 10 mg by mouth daily., Disp: , Rfl:    NOREL AD 4-10-325 MG TABS, Take 1 tablet by mouth 2 (two) times daily as needed., Disp: , Rfl:    promethazine-dextromethorphan (PROMETHAZINE-DM) 6.25-15 MG/5ML syrup, Take 5 mLs by mouth at bedtime as needed for cough. (Patient not taking: Reported on 05/29/2021), Disp: 100 mL, Rfl: 0   Medications ordered in this encounter:  Meds ordered this encounter  Medications   amoxicillin-clavulanate (AUGMENTIN) 875-125 MG tablet    Sig: Take 1 tablet by mouth 2 (two) times daily.    Dispense:  20 tablet    Refill:  0     *If you need refills on other medications prior to your next appointment, please contact your pharmacy*  Follow-Up: Call back or seek an in-person evaluation if the symptoms worsen or if the condition fails to improve as anticipated.  Other Instructions Will prescribe Augmentin for  10 days in the presence of double sickening symptoms with mucopurulent drainage. Symptoms consistent with sinusitis. Will also prescribe prophylactically in the presence of asthma history. Continue Azelastine, Tessalon Perles. May use albuterol, nebulizer for cough, wheezing or SOB. Cool compresses to the face to help reduce swelling. Recommended humidifier and sleeping elevated at bedtime. Follow up with PCP if symptoms do not improve after treatment.   If you have been instructed to have an in-person evaluation today at a local  Urgent Care facility, please use the link below. It will take you to a list of all of our available [facility], including address, phone number and hours of operation. Please do not delay care.  [facility]  If you or a family member do not have a primary care provider, use the link below to schedule a visit and establish care. When you choose a [facility] primary care physician or advanced practice provider, you gain a long-term partner in health. Find a Primary Care Provider  Learn more about [facility]'s in-office and virtual care options: [facility]

## 2021-05-29 NOTE — Progress Notes (Signed)
Virtual Visit Consent   Joyce Welch, you are scheduled for a virtual visit with a [facility] provider today.     Just as with appointments in the office, your consent must be obtained to participate.  Your consent will be active for this visit and any virtual visit you may have with one of our providers in the next 365 days.     If you have a MyChart account, a copy of this consent can be sent to you electronically.  All virtual visits are billed to your insurance company just like a traditional visit in the office.    As this is a virtual visit, video technology does not allow for your provider to perform a traditional examination.  This may limit your provider's ability to fully assess your condition.  If your provider identifies any concerns that need to be evaluated in person or the need to arrange testing (such as labs, EKG, etc.), we will make arrangements to do so.     Although advances in technology are sophisticated, we cannot ensure that it will always work on either your end or our end.  If the connection with a video visit is poor, the visit may have to be switched to a telephone visit.  With either a video or telephone visit, we are not always able to ensure that we have a secure connection.     I need to obtain your verbal consent now.   Are you willing to proceed with your visit today? Yes.   Louisa Mcclenney has provided verbal consent on 05/29/2021 for a virtual visit (video or telephone).   Reynolds Bowl, NP   Date: 05/29/2021 11:22 AM   Virtual Visit via Video Note   I, Reynolds Bowl, connected with  Joyce Welch  ([id number], 12-25-1970) on 05/29/21 at 11:30 AM EDT by a video-enabled telemedicine application and verified that I am speaking with the correct person using two identifiers.  Location: Patient: Virtual Visit Location Patient: Home Provider: Virtual Visit Location Provider: Other: Reynolds Bowl, FNP-C   I discussed the limitations of  evaluation and management by telemedicine and the availability of in person appointments. The patient expressed understanding and agreed to proceed.    History of Present Illness: Joyce Welch is a 50 y.o. who identifies as a female who was assigned female at birth, and is being seen today for worsening sinus symptoms. Was seen approximately one week ago for nasal congestion, mild cough, feeling of "facial fullness". Given symptomatic treatment. Over the past 24 hours, patient complains of worsening nasal drainage, facial swelling and runny nose. Denies fever, chills, HA, ear pain, wheezing, SOB. Has received COVID vaccine and booster. Has a history of asthma, uses nebulizer and albuterol when needed.   HPI: HPI  Problems:  Patient Active Problem List   Diagnosis Date Noted   Paresthesia 10/17/2018   Weakness 10/17/2018   Cough 06/20/2018   Post-nasal drip 06/20/2018   Globus sensation 06/20/2018   Asthma exacerbation 10/31/2017   Leukocytosis 10/31/2017   Strain of quadriceps tendon 02/14/2014   Symptomatic menopausal or female climacteric states 07/31/2013    Allergies: No Known Allergies Medications:  Current Outpatient Medications:    albuterol (PROVENTIL) (2.5 MG/3ML) 0.083% nebulizer solution, Take 3 mLs (2.5 mg total) by nebulization every 6 (six) hours as needed for wheezing or shortness of breath., Disp: 75 mL, Rfl: 0   albuterol (VENTOLIN HFA) 108 (90 Base) MCG/ACT inhaler, Inhale 1 puff into the lungs every  6 (six) hours as needed for wheezing or shortness of breath., Disp: 18 g, Rfl: 0   amLODipine (NORVASC) 5 MG tablet, Take 5 mg by mouth daily., Disp: , Rfl:    azelastine (ASTELIN) 0.1 % nasal spray, Place 1 spray into both nostrils 2 (two) times daily. Use in each nostril as directed (Patient taking differently: Place 1 spray into both nostrils See admin instructions. Instill 1 spray into each nostril two times a day as directed), Disp: 30 mL, Rfl: 12   benzonatate  (TESSALON) 100 MG capsule, Take 1-2 capsules (100-200 mg total) by mouth 3 (three) times daily as needed for cough., Disp: 60 capsule, Rfl: 0   butalbital-acetaminophen-caffeine (FIORICET) 50-325-40 MG tablet, Take 1-2 tablets by mouth 3 (three) times daily as needed for headache., Disp: 20 tablet, Rfl: 0   diclofenac (VOLTAREN) 75 MG EC tablet, Take 1 tablet (75 mg total) by mouth 2 (two) times daily., Disp: 60 tablet, Rfl: 1   DULERA 100-5 MCG/ACT AERO, INHALE 2 PUFFS INTO THE LUNGS TWICE DAILY, Disp: 13 g, Rfl: 3   fluticasone (FLONASE) 50 MCG/ACT nasal spray, Place 2 sprays into both nostrils daily. , Disp: , Rfl:    loratadine (CLARITIN) 10 MG tablet, Take 10 mg by mouth daily., Disp: , Rfl:    NOREL AD 4-10-325 MG TABS, Take 1 tablet by mouth 2 (two) times daily as needed., Disp: , Rfl:    pantoprazole (PROTONIX) 40 MG tablet, Take 40 mg by mouth every morning., Disp: , Rfl:    promethazine-dextromethorphan (PROMETHAZINE-DM) 6.25-15 MG/5ML syrup, Take 5 mLs by mouth at bedtime as needed for cough., Disp: 100 mL, Rfl: 0   sucralfate (CARAFATE) 1 g tablet, Take 1 tablet (1 g total) by mouth 4 (four) times daily. (Patient taking differently: Take 1 g by mouth 3 (three) times daily.), Disp: 30 tablet, Rfl: 0   tiZANidine (ZANAFLEX) 4 MG tablet, 2 (two) times daily., Disp: , Rfl:    Vitamin D, Ergocalciferol, (DRISDOL) 1.25 MG (50000 UT) CAPS capsule, Take 50,000 Units by mouth once a week., Disp: , Rfl:   Observations/Objective: Patient is well-developed, well-nourished in no acute distress.  Resting comfortably at home.  Head is normocephalic, atraumatic.  No labored breathing.  Speech is clear and coherent with logical content.  Patient is alert and oriented at baseline.    Assessment and Plan: 1. Bacterial sinusitis  Will prescribe Augmentin for 10 days in the presence of double sickening symptoms with mucopurulent drainage. Symptoms consistent with sinusitis. Will also prescribe  prophylactically in the presence of asthma history. Continue Azelastine, Tessalon Perles. May use albuterol, nebulizer for cough, wheezing or SOB. Cool compresses to the face to help reduce swelling. Recommended humidifier and sleeping elevated at bedtime. Follow up with PCP if symptoms do not improve after treatment.  Follow Up Instructions: I discussed the assessment and treatment plan with the patient. The patient was provided an opportunity to ask questions and all were answered. The patient agreed with the plan and demonstrated an understanding of the instructions.  A copy of instructions were sent to the patient via MyChart.  The patient was advised to call back or seek an in-person evaluation if the symptoms worsen or if the condition fails to improve as anticipated.  Time:  I spent  20 minutes with the patient via telehealth technology discussing the above problems/concerns.    Reynolds Bowl, NP

## 2021-07-19 DIAGNOSIS — G5601 Carpal tunnel syndrome, right upper limb: Secondary | ICD-10-CM | POA: Insufficient documentation

## 2021-07-19 DIAGNOSIS — G5602 Carpal tunnel syndrome, left upper limb: Secondary | ICD-10-CM | POA: Insufficient documentation

## 2021-07-23 ENCOUNTER — Other Ambulatory Visit: Payer: Self-pay

## 2021-07-23 ENCOUNTER — Encounter: Payer: Self-pay | Admitting: Emergency Medicine

## 2021-07-23 ENCOUNTER — Ambulatory Visit
Admission: EM | Admit: 2021-07-23 | Discharge: 2021-07-23 | Disposition: A | Discharge status: Home / Self Care | Payer: Medicaid Other | Attending: Internal Medicine | Admitting: Internal Medicine

## 2021-07-23 ENCOUNTER — Ambulatory Visit (INDEPENDENT_AMBULATORY_CARE_PROVIDER_SITE_OTHER): Payer: Medicaid Other

## 2021-07-23 DIAGNOSIS — R059 Cough, unspecified: Secondary | ICD-10-CM | POA: Diagnosis not present

## 2021-07-23 DIAGNOSIS — R051 Acute cough: Secondary | ICD-10-CM

## 2021-07-23 DIAGNOSIS — J4521 Mild intermittent asthma with (acute) exacerbation: Secondary | ICD-10-CM | POA: Diagnosis not present

## 2021-07-23 MED ORDER — PREDNISONE 10 MG (21) PO TBPK: ORAL_TABLET | Freq: Every day | ORAL | 0 refills | Status: DC

## 2021-07-23 NOTE — ED Triage Notes (Signed)
Since Monday states her asthma has been acting up, feels like she's getting congested in her chest and feels like she can't get the mucus out. Took a nebulizer treatment without improvement at home. Works in a school.

## 2021-07-23 NOTE — ED Provider Notes (Signed)
[name]    CSN: [id number] Arrival date & time: 07/23/21  6629      History   Chief Complaint Chief Complaint  Patient presents with   Cough    HPI Joyce Welch is a 50 y.o. female.   Patient presents with productive cough that has been present for approximately 6 days.  Cough is productive with yellow to white sputum.  Denies any fevers or any known sick contacts.  Denies any upper respiratory symptoms.  Denies chest pain but does endorse intermittent shortness of breath.  Patient does have history of asthma.   Cough  Past Medical History:  Diagnosis Date   Asthma    Chronic cough    COVID-19 09/18/2021   Fibroids    GERD (gastroesophageal reflux disease)    Hypertension    OAB (overactive bladder)     Patient Active Problem List   Diagnosis Date Noted   Paresthesia 10/17/2018   Weakness 10/17/2018   Cough 06/20/2018   Post-nasal drip 06/20/2018   Globus sensation 06/20/2018   Asthma exacerbation 10/31/2017   Leukocytosis 10/31/2017   Strain of quadriceps tendon 02/14/2014   Symptomatic menopausal or female climacteric states 07/31/2013    Past Surgical History:  Procedure Laterality Date   ABDOMINAL HYSTERECTOMY  02/05/2006   partial   CESAREAN SECTION  1994, 2002, 2005,    x 3   MAXILLARY ANTROSTOMY Right 10/07/2018   Procedure: RIGHT MAXILLARY ANTROSTOMY WITH TISSUE REMOVAL.;  Surgeon: Melida Quitter, MD;  Location: [facility];  Service: ENT;  Laterality: Right;    OB History     Gravida  3   Para  3   Term  1   Preterm      AB      Living  3      SAB      IAB      Ectopic      Multiple      Live Births  3            Home Medications    Prior to Admission medications   Medication Sig Start Date End Date Taking? Authorizing Provider  predniSONE (STERAPRED UNI-PAK 21 TAB) 10 MG (21) TBPK tablet Take by mouth daily. Take 6 tabs by mouth daily  for 2 days, then 5 tabs for 2 days, then 4  tabs for 2 days, then 3 tabs for 2 days, 2 tabs for 2 days, then 1 tab by mouth daily for 2 days 07/23/21  Yes , Hildred Alamin E, FNP  albuterol (PROVENTIL) (2.5 MG/3ML) 0.083% nebulizer solution Take 3 mLs (2.5 mg total) by nebulization every 6 (six) hours as needed for wheezing or shortness of breath. 10/28/20   Jaynee Eagles, PA-C  albuterol (VENTOLIN HFA) 108 (90 Base) MCG/ACT inhaler Inhale 1 puff into the lungs every 6 (six) hours as needed for wheezing or shortness of breath. 10/28/20   Jaynee Eagles, PA-C  amLODipine (NORVASC) 5 MG tablet Take 5 mg by mouth daily.    [provider]  amoxicillin-clavulanate (AUGMENTIN) 875-125 MG tablet Take 1 tablet by mouth 2 (two) times daily. 05/29/21   Kara Dies, NP  azelastine (ASTELIN) 0.1 % nasal spray Place 1 spray into both nostrils 2 (two) times daily. Use in each nostril as directed Patient taking differently: Place 1 spray into both nostrils See admin instructions. Instill 1 spray into each nostril two times a day as directed 06/04/18   [name], MD  benzonatate (  TESSALON) 100 MG capsule Take 1-2 capsules (100-200 mg total) by mouth 3 (three) times daily as needed for cough. Patient not taking: Reported on 05/29/2021 10/28/20   Jaynee Eagles, PA-C  butalbital-acetaminophen-caffeine (FIORICET) [id number] MG tablet Take 1-2 tablets by mouth 3 (three) times daily as needed for headache. 11/08/20 11/08/21  Scot Jun, FNP  diclofenac (VOLTAREN) 75 MG EC tablet Take 1 tablet (75 mg total) by mouth 2 (two) times daily. 10/06/19   Dene Gentry, MD  DULERA 100-5 MCG/ACT AERO INHALE 2 PUFFS INTO THE LUNGS TWICE DAILY 11/15/18   Fenton Foy, NP  fluticasone (FLONASE) 50 MCG/ACT nasal spray Place 2 sprays into both nostrils daily.     [provider]  loratadine (CLARITIN) 10 MG tablet Take 10 mg by mouth daily.    [provider]  NOREL AD 4-10-325 MG TABS Take 1 tablet by mouth 2 (two) times daily as needed. 02/20/20    [provider]  pantoprazole (PROTONIX) 40 MG tablet Take 40 mg by mouth every morning. 09/07/19   [provider]  promethazine-dextromethorphan (PROMETHAZINE-DM) 6.25-15 MG/5ML syrup Take 5 mLs by mouth at bedtime as needed for cough. Patient not taking: Reported on 05/29/2021 10/28/20   Jaynee Eagles, PA-C  sucralfate (CARAFATE) 1 g tablet Take 1 tablet (1 g total) by mouth 4 (four) times daily. 06/22/19   Lacretia Leigh, MD  tiZANidine (ZANAFLEX) 4 MG tablet 2 (two) times daily. 05/20/19   [provider]  Vitamin D, Ergocalciferol, (DRISDOL) 1.25 MG (50000 UT) CAPS capsule Take 50,000 Units by mouth once a week. 05/16/19   [provider]    Family History Family History  Problem Relation Age of Onset   Hypertension Mother    Other Father        unsure of medical history   Breast cancer Maternal Aunt     Social History Social History   Tobacco Use   Smoking status: Never   Smokeless tobacco: Never  Vaping Use   Vaping Use: Never used  Substance Use Topics   Alcohol use: Yes    Comment: socially   Drug use: Never     Allergies   Patient has no known allergies.   Review of Systems Review of Systems Per HPI  Physical Exam Triage Vital Signs ED Triage Vitals  Enc Vitals Group     BP 07/23/21 0905 118/75     Pulse Rate 07/23/21 0905 81     Resp 07/23/21 0905 20     Temp 07/23/21 0905 98 F (36.7 C)     Temp Source 07/23/21 0905 Oral     SpO2 07/23/21 0905 97 %     Weight --      Height --      Head Circumference --      Peak Flow --      Pain Score 07/23/21 0908 0     Pain Loc --      Pain Edu? --      Excl. in [place]? --    No data found.  Updated Vital Signs BP 118/75 (BP Location: Left Arm)   Pulse 81   Temp 98 F (36.7 C) (Oral)   Resp 20   SpO2 97%   Visual Acuity Right Eye Distance:   Left Eye Distance:   Bilateral Distance:    Right Eye Near:   Left Eye Near:    Bilateral Near:     Physical  Exam Constitutional:  General: She is not in acute distress.    Appearance: Normal appearance. She is not toxic-appearing or diaphoretic.  HENT:     Head: Normocephalic and atraumatic.     Right Ear: Tympanic membrane and ear canal normal.     Left Ear: Tympanic membrane and ear canal normal.     Nose: Nose normal.     Mouth/Throat:     Mouth: Mucous membranes are moist.     Pharynx: No posterior oropharyngeal erythema.  Eyes:     Extraocular Movements: Extraocular movements intact.     Conjunctiva/sclera: Conjunctivae normal.     Pupils: Pupils are equal, round, and reactive to light.  Cardiovascular:     Rate and Rhythm: Normal rate and regular rhythm.     Pulses: Normal pulses.     Heart sounds: Normal heart sounds.  Pulmonary:     Effort: Pulmonary effort is normal. No respiratory distress.     Breath sounds: Normal breath sounds. No stridor. No wheezing, rhonchi or rales.  Musculoskeletal:        General: Normal range of motion.  Skin:    General: Skin is warm and dry.  Neurological:     General: No focal deficit present.     Mental Status: She is alert and oriented to person, place, and time. Mental status is at baseline.  Psychiatric:        Mood and Affect: Mood normal.        Behavior: Behavior normal.        Thought Content: Thought content normal.        Judgment: Judgment normal.     UC Treatments / Results  Labs (all labs ordered are listed, but only abnormal results are displayed) Labs Reviewed  NOVEL CORONAVIRUS, NAA    EKG   Radiology DG Chest 2 View  Result Date: 07/23/2021 CLINICAL DATA:  50 year old female with cough. EXAM: CHEST - 2 VIEW COMPARISON:  10/28/2020 FINDINGS: The mediastinal contours are within normal limits. No cardiomegaly. Poor inspiratory effort. The lungs are clear bilaterally without evidence of focal consolidation, pleural effusion, or pneumothorax. No acute osseous abnormality. IMPRESSION: Low lung volumes.  No acute  cardiopulmonary process. Electronically Signed   By: Ruthann Cancer M.D.   On: 07/23/2021 09:51    Procedures Procedures (including critical care time)  Medications Ordered in UC Medications - No data to display  Initial Impression / Assessment and Plan / UC Course  I have reviewed the triage vital signs and the nursing notes.  Pertinent labs & imaging results that were available during my care of the patient were reviewed by me and considered in my medical decision making (see chart for details).     Chest x-ray was unremarkable.  Suspect asthma exacerbation.  Will treat with prednisone steroid taper.  Patient to continue albuterol inhaler.  Patient is nontoxic-appearing at this time.  Covid 19 PCR pending.Discussed strict return precautions. Patient verbalized understanding and is agreeable with plan.  Final Clinical Impressions(s) / UC Diagnoses   Final diagnoses:  Acute cough  Mild intermittent asthma with acute exacerbation     Discharge Instructions      Your chest x-ray was normal.  You have been prescribed prednisone steroid to decrease inflammation associated with asthma exacerbation.  You may continue your albuterol inhaler.     ED Prescriptions     Medication Sig Dispense Auth. Provider   predniSONE (STERAPRED UNI-PAK 21 TAB) 10 MG (21) TBPK tablet Take by mouth daily. Take 6  tabs by mouth daily  for 2 days, then 5 tabs for 2 days, then 4 tabs for 2 days, then 3 tabs for 2 days, 2 tabs for 2 days, then 1 tab by mouth daily for 2 days 42 tablet [place], Michele Rockers, [place]      PDMP not reviewed this encounter.   Teodora Medici, [place] 07/23/21 1004

## 2021-07-23 NOTE — Discharge Instructions (Signed)
Your chest x-ray was normal.  You have been prescribed prednisone steroid to decrease inflammation associated with asthma exacerbation.  You may continue your albuterol inhaler.

## 2021-07-24 LAB — NOVEL CORONAVIRUS, NAA: SARS-CoV-2, NAA: NOT DETECTED

## 2021-07-24 LAB — SARS-COV-2, NAA 2 DAY TAT

## 2021-07-31 ENCOUNTER — Ambulatory Visit
Admission: EM | Admit: 2021-07-31 | Discharge: 2021-07-31 | Disposition: A | Discharge status: Home / Self Care | Payer: Medicaid Other | Attending: Physician Assistant | Admitting: Physician Assistant

## 2021-07-31 ENCOUNTER — Other Ambulatory Visit: Payer: Self-pay

## 2021-07-31 DIAGNOSIS — J45901 Unspecified asthma with (acute) exacerbation: Secondary | ICD-10-CM | POA: Diagnosis not present

## 2021-07-31 DIAGNOSIS — J019 Acute sinusitis, unspecified: Secondary | ICD-10-CM

## 2021-07-31 MED ORDER — AMOXICILLIN-POT CLAVULANATE 875-125 MG PO TABS: 1.0000 | ORAL_TABLET | Freq: Two times a day (BID) | ORAL | 0 refills | Status: DC

## 2021-07-31 NOTE — ED Triage Notes (Signed)
Pt was came in on 07/23/21 for coughing and congestion. Pt states her symptoms has not gotten better. Pt state coughing so much has made her chest and back area hurt.

## 2021-07-31 NOTE — ED Provider Notes (Signed)
[name]    CSN: [id number] Arrival date & time: 07/31/21  0940      History   Chief Complaint Chief Complaint  Patient presents with   Cough    HPI Joyce Welch is a 50 y.o. female.   Here today for evaluation of cough that has persisted despite steroid therapy for suspected asthma exacerbation.  She reports that she is having significant sinus symptoms and states she has had some blood-tinged mucus when blowing her nose.  She does not report any fever.  The history is provided by the patient.  Cough Associated symptoms: no chills, no ear pain, no eye discharge, no fever, no shortness of breath, no sore throat and no wheezing    Past Medical History:  Diagnosis Date   Asthma    Chronic cough    COVID-19 09/18/2021   Fibroids    GERD (gastroesophageal reflux disease)    Hypertension    OAB (overactive bladder)     Patient Active Problem List   Diagnosis Date Noted   Paresthesia 10/17/2018   Weakness 10/17/2018   Cough 06/20/2018   Post-nasal drip 06/20/2018   Globus sensation 06/20/2018   Asthma exacerbation 10/31/2017   Leukocytosis 10/31/2017   Strain of quadriceps tendon 02/14/2014   Symptomatic menopausal or female climacteric states 07/31/2013    Past Surgical History:  Procedure Laterality Date   ABDOMINAL HYSTERECTOMY  02/05/2006   partial   [place], 2002, 2005,    x 3   MAXILLARY ANTROSTOMY Right 10/07/2018   Procedure: RIGHT MAXILLARY ANTROSTOMY WITH TISSUE REMOVAL.;  Surgeon: Melida Quitter, MD;  Location: [facility];  Service: ENT;  Laterality: Right;    OB History     Gravida  3   Para  3   Term  1   Preterm      AB      Living  3      SAB      IAB      Ectopic      Multiple      Live Births  3            Home Medications    Prior to Admission medications   Medication Sig Start Date End Date Taking? Authorizing Provider  amoxicillin-clavulanate (AUGMENTIN)  875-125 MG tablet Take 1 tablet by mouth every 12 (twelve) hours. 07/31/21  Yes Francene Finders, PA-C  albuterol (PROVENTIL) (2.5 MG/3ML) 0.083% nebulizer solution Take 3 mLs (2.5 mg total) by nebulization every 6 (six) hours as needed for wheezing or shortness of breath. 10/28/20   Jaynee Eagles, PA-C  albuterol (VENTOLIN HFA) 108 (90 Base) MCG/ACT inhaler Inhale 1 puff into the lungs every 6 (six) hours as needed for wheezing or shortness of breath. 10/28/20   Jaynee Eagles, PA-C  amLODipine (NORVASC) 5 MG tablet Take 5 mg by mouth daily.    [provider]  azelastine (ASTELIN) 0.1 % nasal spray Place 1 spray into both nostrils 2 (two) times daily. Use in each nostril as directed Patient taking differently: Place 1 spray into both nostrils See admin instructions. Instill 1 spray into each nostril two times a day as directed 06/04/18   [name], MD  benzonatate (TESSALON) 100 MG capsule Take 1-2 capsules (100-200 mg total) by mouth 3 (three) times daily as needed for cough. Patient not taking: Reported on 05/29/2021 10/28/20   Jaynee Eagles, PA-C  butalbital-acetaminophen-caffeine (FIORICET) [phone] MG tablet Take 1-2 tablets by mouth  3 (three) times daily as needed for headache. 11/08/20 11/08/21  Scot Jun, FNP  diclofenac (VOLTAREN) 75 MG EC tablet Take 1 tablet (75 mg total) by mouth 2 (two) times daily. 10/06/19   Dene Gentry, MD  DULERA 100-5 MCG/ACT AERO INHALE 2 PUFFS INTO THE LUNGS TWICE DAILY 11/15/18   Fenton Foy, NP  fluticasone (FLONASE) 50 MCG/ACT nasal spray Place 2 sprays into both nostrils daily.     [provider]  loratadine (CLARITIN) 10 MG tablet Take 10 mg by mouth daily.    [provider]  NOREL AD 4-10-325 MG TABS Take 1 tablet by mouth 2 (two) times daily as needed. 02/20/20   [provider]  pantoprazole (PROTONIX) 40 MG tablet Take 40 mg by mouth every morning. 09/07/19   [provider]  predniSONE (STERAPRED UNI-PAK  21 TAB) 10 MG (21) TBPK tablet Take by mouth daily. Take 6 tabs by mouth daily  for 2 days, then 5 tabs for 2 days, then 4 tabs for 2 days, then 3 tabs for 2 days, 2 tabs for 2 days, then 1 tab by mouth daily for 2 days 07/23/21   Teodora Medici, FNP  promethazine-dextromethorphan (PROMETHAZINE-DM) 6.25-15 MG/5ML syrup Take 5 mLs by mouth at bedtime as needed for cough. Patient not taking: Reported on 05/29/2021 10/28/20   Jaynee Eagles, PA-C  sucralfate (CARAFATE) 1 g tablet Take 1 tablet (1 g total) by mouth 4 (four) times daily. 06/22/19   Lacretia Leigh, MD  tiZANidine (ZANAFLEX) 4 MG tablet 2 (two) times daily. 05/20/19   [provider]  Vitamin D, Ergocalciferol, (DRISDOL) 1.25 MG (50000 UT) CAPS capsule Take 50,000 Units by mouth once a week. 05/16/19   [provider]    Family History Family History  Problem Relation Age of Onset   Hypertension Mother    Other Father        unsure of medical history   Breast cancer Maternal Aunt     Social History Social History   Tobacco Use   Smoking status: Never   Smokeless tobacco: Never  Vaping Use   Vaping Use: Never used  Substance Use Topics   Alcohol use: Yes    Comment: socially   Drug use: Never     Allergies   Patient has no known allergies.   Review of Systems Review of Systems  Constitutional:  Negative for chills and fever.  HENT:  Positive for congestion and sinus pressure. Negative for ear pain and sore throat.   Eyes:  Negative for discharge and redness.  Respiratory:  Positive for cough. Negative for shortness of breath and wheezing.   Gastrointestinal:  Negative for abdominal pain, diarrhea, nausea and vomiting.    Physical Exam Triage Vital Signs ED Triage Vitals  Enc Vitals Group     BP 07/31/21 1001 112/73     Pulse Rate 07/31/21 1001 87     Resp 07/31/21 1001 18     Temp 07/31/21 1001 98.2 F (36.8 C)     Temp Source 07/31/21 1001 Oral     SpO2 07/31/21 1001 98 %     Weight --       Height --      Head Circumference --      Peak Flow --      Pain Score 07/31/21 1003 0     Pain Loc --      Pain Edu? --      Excl. in [place]? --  No data found.  Updated Vital Signs BP 112/73 (BP Location: Right Arm)   Pulse 87   Temp 98.2 F (36.8 C) (Oral)   Resp 18   SpO2 98%      Physical Exam Vitals and nursing note reviewed.  Constitutional:      General: She is not in acute distress.    Appearance: Normal appearance. She is not ill-appearing.  HENT:     Head: Normocephalic and atraumatic.     Right Ear: Tympanic membrane normal.     Left Ear: Tympanic membrane normal.     Nose: Congestion present.     Mouth/Throat:     Mouth: Mucous membranes are moist.     Pharynx: No oropharyngeal exudate or posterior oropharyngeal erythema.  Eyes:     Conjunctiva/sclera: Conjunctivae normal.  Cardiovascular:     Rate and Rhythm: Normal rate and regular rhythm.     Heart sounds: Normal heart sounds. No murmur heard. Pulmonary:     Effort: Pulmonary effort is normal. No respiratory distress.     Breath sounds: Normal breath sounds. No wheezing, rhonchi or rales.  Skin:    General: Skin is warm and dry.  Neurological:     Mental Status: She is alert.  Psychiatric:        Mood and Affect: Mood normal.        Thought Content: Thought content normal.     UC Treatments / Results  Labs (all labs ordered are listed, but only abnormal results are displayed) Labs Reviewed - No data to display  EKG   Radiology No results found.  Procedures Procedures (including critical care time)  Medications Ordered in UC Medications - No data to display  Initial Impression / Assessment and Plan / UC Course  I have reviewed the triage vital signs and the nursing notes.  Pertinent labs & imaging results that were available during my care of the patient were reviewed by me and considered in my medical decision making (see chart for details).  Recommended she continue steroid taper  but will also add Augmentin for treatment of suspected sinusitis.  Encouraged follow-up if symptoms fail to improve or worsen anyway.  Final Clinical Impressions(s) / UC Diagnoses   Final diagnoses:  Asthma with acute exacerbation, unspecified asthma severity, unspecified whether persistent  Acute sinusitis, recurrence not specified, unspecified location   Discharge Instructions   None    ED Prescriptions     Medication Sig Dispense Auth. Provider   amoxicillin-clavulanate (AUGMENTIN) 875-125 MG tablet Take 1 tablet by mouth every 12 (twelve) hours. 14 tablet Francene Finders, PA-C      PDMP not reviewed this encounter.   Francene Finders, PA-C 07/31/21 1106

## 2021-08-06 ENCOUNTER — Other Ambulatory Visit: Payer: Self-pay

## 2021-08-06 ENCOUNTER — Ambulatory Visit
Admission: RE | Admit: 2021-08-06 | Discharge: 2021-08-06 | Disposition: A | Discharge status: Home / Self Care | Payer: Medicaid Other | Source: Ambulatory Visit | Attending: Internal Medicine | Admitting: Internal Medicine

## 2021-08-06 VITALS — BP 125/84 | HR 90 | Temp 98.0°F | Resp 18

## 2021-08-06 DIAGNOSIS — H109 Unspecified conjunctivitis: Secondary | ICD-10-CM

## 2021-08-06 DIAGNOSIS — B9689 Other specified bacterial agents as the cause of diseases classified elsewhere: Secondary | ICD-10-CM

## 2021-08-06 MED ORDER — ERYTHROMYCIN 5 MG/GM OP OINT: TOPICAL_OINTMENT | OPHTHALMIC | 0 refills | Status: DC

## 2021-08-06 NOTE — ED Triage Notes (Signed)
Pt c/o left eye redness and discharge, pain,   Denies itching or burning.   Onset Thursday

## 2021-08-06 NOTE — Discharge Instructions (Signed)
It appears that you have pinkeye.  This is being treated with erythromycin eye ointment.  Please pick this up at the pharmacy.  Change pillowcase nightly.

## 2021-08-06 NOTE — ED Provider Notes (Signed)
[name]    CSN: [id number] Arrival date & time: 08/06/21  2878      History   Chief Complaint Chief Complaint  Patient presents with   left eye redness    HPI Joyce Welch is a 50 y.o. female.   Patient presents with left eye redness and drainage, discomfort that started approximately 3 days ago.  Patient reports that it feels like it is spreading to her right eye.  Denies itchiness or blurry vision.  Patient reports white purulent discharge upon awakening this morning into the left eye.  Denies any trauma or foreign bodies to the eye.  Patient has been seen twice prior to this urgent care visit recently for acute illness and asthma exacerbation.  Patient wears glasses but does not wear contacts.     Past Medical History:  Diagnosis Date   Asthma    Chronic cough    COVID-19 09/18/2021   Fibroids    GERD (gastroesophageal reflux disease)    Hypertension    OAB (overactive bladder)     Patient Active Problem List   Diagnosis Date Noted   Paresthesia 10/17/2018   Weakness 10/17/2018   Cough 06/20/2018   Post-nasal drip 06/20/2018   Globus sensation 06/20/2018   Asthma exacerbation 10/31/2017   Leukocytosis 10/31/2017   Strain of quadriceps tendon 02/14/2014   Symptomatic menopausal or female climacteric states 07/31/2013    Past Surgical History:  Procedure Laterality Date   ABDOMINAL HYSTERECTOMY  02/05/2006   partial   CESAREAN SECTION  1994, 2002, 2005,    x 3   MAXILLARY ANTROSTOMY Right 10/07/2018   Procedure: RIGHT MAXILLARY ANTROSTOMY WITH TISSUE REMOVAL.;  Surgeon: Melida Quitter, MD;  Location: [facility];  Service: ENT;  Laterality: Right;    OB History     Gravida  3   Para  3   Term  1   Preterm      AB      Living  3      SAB      IAB      Ectopic      Multiple      Live Births  3            Home Medications    Prior to Admission medications   Medication Sig Start Date End Date  Taking? Authorizing Provider  erythromycin ophthalmic ointment Place a 1/2 inch ribbon of ointment into the lower eyelid 4 times daily for 7 days. 08/06/21  Yes , Hildred Alamin E, FNP  albuterol (PROVENTIL) (2.5 MG/3ML) 0.083% nebulizer solution Take 3 mLs (2.5 mg total) by nebulization every 6 (six) hours as needed for wheezing or shortness of breath. 10/28/20   Jaynee Eagles, PA-C  albuterol (VENTOLIN HFA) 108 (90 Base) MCG/ACT inhaler Inhale 1 puff into the lungs every 6 (six) hours as needed for wheezing or shortness of breath. 10/28/20   Jaynee Eagles, PA-C  amLODipine (NORVASC) 5 MG tablet Take 5 mg by mouth daily.    [provider]  amoxicillin-clavulanate (AUGMENTIN) 875-125 MG tablet Take 1 tablet by mouth every 12 (twelve) hours. 07/31/21   Francene Finders, PA-C  azelastine (ASTELIN) 0.1 % nasal spray Place 1 spray into both nostrils 2 (two) times daily. Use in each nostril as directed Patient taking differently: Place 1 spray into both nostrils See admin instructions. Instill 1 spray into each nostril two times a day as directed 06/04/18   [name], MD  benzonatate (TESSALON) 100  MG capsule Take 1-2 capsules (100-200 mg total) by mouth 3 (three) times daily as needed for cough. Patient not taking: Reported on 05/29/2021 10/28/20   Jaynee Eagles, PA-C  butalbital-acetaminophen-caffeine (FIORICET) [phone] MG tablet Take 1-2 tablets by mouth 3 (three) times daily as needed for headache. 11/08/20 11/08/21  Scot Jun, FNP  diclofenac (VOLTAREN) 75 MG EC tablet Take 1 tablet (75 mg total) by mouth 2 (two) times daily. 10/06/19   Dene Gentry, MD  DULERA 100-5 MCG/ACT AERO INHALE 2 PUFFS INTO THE LUNGS TWICE DAILY 11/15/18   Fenton Foy, NP  fluticasone (FLONASE) 50 MCG/ACT nasal spray Place 2 sprays into both nostrils daily.     [provider]  loratadine (CLARITIN) 10 MG tablet Take 10 mg by mouth daily.    [provider]  NOREL AD 4-10-325 MG TABS Take 1 tablet  by mouth 2 (two) times daily as needed. 02/20/20   [provider]  pantoprazole (PROTONIX) 40 MG tablet Take 40 mg by mouth every morning. 09/07/19   [provider]  predniSONE (STERAPRED UNI-PAK 21 TAB) 10 MG (21) TBPK tablet Take by mouth daily. Take 6 tabs by mouth daily  for 2 days, then 5 tabs for 2 days, then 4 tabs for 2 days, then 3 tabs for 2 days, 2 tabs for 2 days, then 1 tab by mouth daily for 2 days 07/23/21   Teodora Medici, FNP  promethazine-dextromethorphan (PROMETHAZINE-DM) 6.25-15 MG/5ML syrup Take 5 mLs by mouth at bedtime as needed for cough. Patient not taking: Reported on 05/29/2021 10/28/20   Jaynee Eagles, PA-C  sucralfate (CARAFATE) 1 g tablet Take 1 tablet (1 g total) by mouth 4 (four) times daily. 06/22/19   Lacretia Leigh, MD  tiZANidine (ZANAFLEX) 4 MG tablet 2 (two) times daily. 05/20/19   [provider]  Vitamin D, Ergocalciferol, (DRISDOL) 1.25 MG (50000 UT) CAPS capsule Take 50,000 Units by mouth once a week. 05/16/19   [provider]    Family History Family History  Problem Relation Age of Onset   Hypertension Mother    Other Father        unsure of medical history   Breast cancer Maternal Aunt     Social History Social History   Tobacco Use   Smoking status: Never   Smokeless tobacco: Never  Vaping Use   Vaping Use: Never used  Substance Use Topics   Alcohol use: Yes    Comment: socially   Drug use: Never     Allergies   Patient has no known allergies.   Review of Systems Review of Systems Per HPI  Physical Exam Triage Vital Signs ED Triage Vitals [08/06/21 1151]  Enc Vitals Group     BP 125/84     Pulse Rate 90     Resp 18     Temp 98 F (36.7 C)     Temp Source Oral     SpO2 98 %     Weight      Height      Head Circumference      Peak Flow      Pain Score 0     Pain Loc      Pain Edu?      Excl. in [place]?    No data found.  Updated Vital Signs BP 125/84 (BP Location: Left Arm)   Pulse  90   Temp 98 F (36.7 C) (Oral)   Resp 18   SpO2  98%   Visual Acuity Right Eye Distance: 20/70 Left Eye Distance: 20/100 Bilateral Distance: 20/40  Right Eye Near:   Left Eye Near:    Bilateral Near:     Physical Exam Constitutional:      General: She is not in acute distress.    Appearance: Normal appearance. She is not toxic-appearing or diaphoretic.  HENT:     Head: Normocephalic and atraumatic.  Eyes:     General: Lids are normal. Lids are everted, no foreign bodies appreciated. Vision grossly intact. Gaze aligned appropriately.        Right eye: No foreign body, discharge or hordeolum.        Left eye: Discharge present.No foreign body or hordeolum.     Extraocular Movements: Extraocular movements intact.     Conjunctiva/sclera:     Right eye: Right conjunctiva is injected. No chemosis, exudate or hemorrhage.    Left eye: Left conjunctiva is injected. Exudate present. No chemosis or hemorrhage.    Pupils: Pupils are equal, round, and reactive to light.  Pulmonary:     Effort: Pulmonary effort is normal.  Neurological:     General: No focal deficit present.     Mental Status: She is alert and oriented to person, place, and time. Mental status is at baseline.  Psychiatric:        Mood and Affect: Mood normal.        Behavior: Behavior normal.        Thought Content: Thought content normal.        Judgment: Judgment normal.     UC Treatments / Results  Labs (all labs ordered are listed, but only abnormal results are displayed) Labs Reviewed - No data to display  EKG   Radiology No results found.  Procedures Procedures (including critical care time)  Medications Ordered in UC Medications - No data to display  Initial Impression / Assessment and Plan / UC Course  I have reviewed the triage vital signs and the nursing notes.  Pertinent labs & imaging results that were available during my care of the patient were reviewed by me and considered in my medical  decision making (see chart for details).     It appears that patient has bacterial conjunctivitis of the left eye that could be spreading to the right eye.  Will prescribe erythromycin antibiotic x7 days.  Visual acuity appears fairly normal on exam.  Patient did not have her eyeglasses with her during visual acuity testing.  Do not think that pinkeye is affecting visual acuity.  No red flags on exam.  Discussed return precautions.  Patient verbalized understanding and was agreeable with plan. Final Clinical Impressions(s) / UC Diagnoses   Final diagnoses:  Bacterial conjunctivitis of both eyes     Discharge Instructions      It appears that you have pinkeye.  This is being treated with erythromycin eye ointment.  Please pick this up at the pharmacy.  Change pillowcase nightly.     ED Prescriptions     Medication Sig Dispense Auth. Provider   erythromycin ophthalmic ointment Place a 1/2 inch ribbon of ointment into the lower eyelid 4 times daily for 7 days. 3.5 g Teodora Medici, [place]      PDMP not reviewed this encounter.   Teodora Medici, [place] 08/06/21 1327

## 2021-08-28 ENCOUNTER — Other Ambulatory Visit: Payer: Self-pay

## 2021-08-28 ENCOUNTER — Encounter: Payer: Self-pay | Admitting: *Deleted

## 2021-08-28 ENCOUNTER — Ambulatory Visit
Admission: EM | Admit: 2021-08-28 | Discharge: 2021-08-28 | Disposition: A | Discharge status: Home / Self Care | Payer: Medicaid Other | Attending: Physician Assistant | Admitting: Physician Assistant

## 2021-08-28 DIAGNOSIS — M25561 Pain in right knee: Secondary | ICD-10-CM

## 2021-08-28 DIAGNOSIS — M549 Dorsalgia, unspecified: Secondary | ICD-10-CM

## 2021-08-28 HISTORY — DX: Urinary tract infection, site not specified: N39.0

## 2021-08-28 MED ORDER — PREDNISONE 20 MG PO TABS: 40.0000 mg | ORAL_TABLET | Freq: Every day | ORAL | 0 refills | Status: AC

## 2021-08-28 MED ORDER — CYCLOBENZAPRINE HCL 10 MG PO TABS: 10.0000 mg | ORAL_TABLET | Freq: Two times a day (BID) | ORAL | 0 refills | Status: DC | PRN
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
## 2021-08-30 DIAGNOSIS — R0982 Postnasal drip: Secondary | ICD-10-CM | POA: Insufficient documentation

## 2021-09-20 ENCOUNTER — Ambulatory Visit: Payer: Medicaid Other

## 2021-09-20 ENCOUNTER — Ambulatory Visit
Admission: RE | Admit: 2021-09-20 | Discharge: 2021-09-20 | Disposition: A | Discharge status: Home / Self Care | Payer: Medicaid Other | Source: Ambulatory Visit | Attending: Internal Medicine | Admitting: Internal Medicine

## 2021-09-20 ENCOUNTER — Ambulatory Visit (INDEPENDENT_AMBULATORY_CARE_PROVIDER_SITE_OTHER): Payer: Medicaid Other

## 2021-09-20 VITALS — BP 118/81 | HR 108 | Temp 97.9°F | Resp 16

## 2021-09-20 DIAGNOSIS — M25561 Pain in right knee: Secondary | ICD-10-CM | POA: Insufficient documentation
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
## 2021-10-24 DIAGNOSIS — M25551 Pain in right hip: Secondary | ICD-10-CM | POA: Insufficient documentation

## 2021-10-24 DIAGNOSIS — M25461 Effusion, right knee: Secondary | ICD-10-CM | POA: Insufficient documentation

## 2021-10-24 DIAGNOSIS — M25561 Pain in right knee: Secondary | ICD-10-CM | POA: Insufficient documentation

## 2021-11-08 NOTE — Progress Notes (Deleted)
NEW PATIENT Date of Service/Encounter:  11/08/21 Referring provider: Melida Quitter, MD Primary care provider: [name], MD  Subjective:  Joyce Welch is a 51 y.o. female with a PMHx of cough, chronic rhinitis and nasal polyps, PND presenting today for evaluation of "allergies". History obtained from: chart review and {Persons; PED relatives w/patient:19415::"patient"}.   Chronic rhinitis: started *** Symptoms include: {Blank multiple:19196:a:"***","nasal congestion","rhinorrhea","post nasal drainage","sneezing","watery eyes","itchy eyes","itchy nose"}  Occurs {Blank single:19197::"year-round","seasonally-***","year-round with seasonal flares","***"} Potential triggers: *** Treatments tried: azelastine, flonase, claritin Previous allergy testing: {Blank single:19197::"yes","no"} History of reflux/heartburn:  prescribed protonix 40 mg daily, increased to twice daily at recent ENT visit with Dr. Redmond Baseman ENT  Asthma History:  -Diagnosed at age ***.  -Current symptoms include {Blank multiple:19196:a:"***","chest tightness","cough","shortness of breath","wheezing"} *** daytime symptoms in past month, *** nighttime awakenings in past month Using rescue inhaler *** -Limitations to daily activity: {Blank single:19197::"none","mild","some","severe"} - *** ED visits, *** UC visits and *** oral steroids in the past year - *** number of lifetime hospitalizations, *** number of lifetime intubations.  - Identified Triggers: {Blank multiple:19196:a:"***","exercise","respiratory illness","smoke exposure","cold air"} - Up-to-date with {Blank multiple:19196:a:"***","pneumonia,","Covid-19,","Flu,"} vaccines. - History of prior pneumonias: *** - History of prior COVID-19 infection: *** - Smoking exposure: *** Previous Diagnostics:  - Prior PFTs or spirometry: *** - Most Recent AEC (***/***/***): *** -Most Recent Chest Imaging: *** on (***/***/***): *** - Today's Asthma Control Test:  .    Management:  - Previously used therapies: ***.  - Current regimen:  - Maintenance: Dulera 100-5, 2 puffs BID - Rescue: Albuterol 2 puffs q4-6 hrs PRN, *** prior to exercise   Other allergy screening: Asthma: {Blank single:19197::"yes","no"} Rhino conjunctivitis: {Blank single:19197::"yes","no"} Food allergy: {Blank single:19197::"yes","no"} Medication allergy: {Blank single:19197::"yes","no"} Hymenoptera allergy: {Blank single:19197::"yes","no"} Urticaria: {Blank single:19197::"yes","no"} Eczema:{Blank single:19197::"yes","no"} History of recurrent infections suggestive of immunodeficency: {Blank single:19197::"yes","no"} ***Vaccinations are up to date.   Past Medical History: Past Medical History:  Diagnosis Date   Asthma    Chronic cough    COVID-19 09/18/2021   Fibroids    GERD (gastroesophageal reflux disease)    Hypertension    OAB (overactive bladder)    UTI (urinary tract infection)    Medication List:  Current Outpatient Medications  Medication Sig Dispense Refill   albuterol (PROVENTIL) (2.5 MG/3ML) 0.083% nebulizer solution Take 3 mLs (2.5 mg total) by nebulization every 6 (six) hours as needed for wheezing or shortness of breath. 75 mL 0   albuterol (VENTOLIN HFA) 108 (90 Base) MCG/ACT inhaler Inhale 1 puff into the lungs every 6 (six) hours as needed for wheezing or shortness of breath. 18 g 0   amLODipine (NORVASC) 5 MG tablet Take 5 mg by mouth daily.     amoxicillin-clavulanate (AUGMENTIN) 875-125 MG tablet Take 1 tablet by mouth every 12 (twelve) hours. 14 tablet 0   azelastine (ASTELIN) 0.1 % nasal spray Place 1 spray into both nostrils 2 (two) times daily. Use in each nostril as directed (Patient taking differently: Place 1 spray into both nostrils See admin instructions. Instill 1 spray into each nostril two times a day as directed) 30 mL 12   benzonatate (TESSALON) 100 MG capsule Take 1-2 capsules (100-200 mg total) by mouth 3 (three) times daily as needed  for cough. (Patient not taking: Reported on 05/29/2021) 60 capsule 0   butalbital-acetaminophen-caffeine (FIORICET) 50-325-40 MG tablet Take 1-2 tablets by mouth 3 (three) times daily as needed for headache. 20 tablet 0   cyclobenzaprine (FLEXERIL) 10 MG tablet Take 1 tablet (10 mg total)  by mouth 2 (two) times daily as needed for muscle spasms. 20 tablet 0   diclofenac (VOLTAREN) 75 MG EC tablet Take 1 tablet (75 mg total) by mouth 2 (two) times daily. 60 tablet 1   DULERA 100-5 MCG/ACT AERO INHALE 2 PUFFS INTO THE LUNGS TWICE DAILY 13 g 3   erythromycin ophthalmic ointment Place a 1/2 inch ribbon of ointment into the lower eyelid 4 times daily for 7 days. 3.5 g 0   fluticasone (FLONASE) 50 MCG/ACT nasal spray Place 2 sprays into both nostrils daily.      loratadine (CLARITIN) 10 MG tablet Take 10 mg by mouth daily.     NOREL AD 4-10-325 MG TABS Take 1 tablet by mouth 2 (two) times daily as needed.     pantoprazole (PROTONIX) 40 MG tablet Take 40 mg by mouth every morning.     promethazine-dextromethorphan (PROMETHAZINE-DM) 6.25-15 MG/5ML syrup Take 5 mLs by mouth at bedtime as needed for cough. (Patient not taking: Reported on 05/29/2021) 100 mL 0   sucralfate (CARAFATE) 1 g tablet Take 1 tablet (1 g total) by mouth 4 (four) times daily. 30 tablet 0   tiZANidine (ZANAFLEX) 4 MG tablet 2 (two) times daily.     Vitamin D, Ergocalciferol, (DRISDOL) 1.25 MG (50000 UT) CAPS capsule Take 50,000 Units by mouth once a week.     No current facility-administered medications for this visit.   Known Allergies:  No Known Allergies Past Surgical History: Past Surgical History:  Procedure Laterality Date   ABDOMINAL HYSTERECTOMY  02/05/2006   partial   CESAREAN SECTION  1994, 2002, 2005,    x 3   MAXILLARY ANTROSTOMY Right 10/07/2018   Procedure: RIGHT MAXILLARY ANTROSTOMY WITH TISSUE REMOVAL.;  Surgeon: Melida Quitter, MD;  Location: [facility];  Service: ENT;  Laterality: Right;   Family  History: Family History  Problem Relation Age of Onset   Hypertension Mother    Other Father        unsure of medical history   Breast cancer Maternal Aunt    Social History: [place] lives ***.   ROS:  All other systems negative except as noted per HPI.  Objective:  There were no vitals taken for this visit. There is no height or weight on file to calculate BMI. Physical Exam:  General Appearance:  Alert, cooperative, no distress, appears stated age  Head:  Normocephalic, without obvious abnormality, atraumatic  Eyes:  Conjunctiva clear, EOM's intact  Nose: Nares normal, {Blank multiple:19196:a:"***","hypertrophic turbinates","normal mucosa","no visible anterior polyps","septum midline"}  Throat: Lips, tongue normal; teeth and gums normal, {Blank multiple:19196:a:"***","normal posterior oropharynx","tonsils 2+","tonsils 3+","no tonsillar exudate","+ cobblestoning"}  Neck: Supple, symmetrical  Lungs:   {Blank multiple:19196:a:"***","clear to auscultation bilaterally","end-expiratory wheezing","wheezing throughout"}, Respirations unlabored, {Blank multiple:19196:a:"***","no coughing","intermittent dry coughing"}  Heart:  {Blank multiple:19196:a:"***","regular rate and rhythm","no murmur"}, Appears well perfused  Extremities: No edema  Skin: Skin color, texture, turgor normal, no rashes or lesions on visualized portions of skin  Neurologic: No gross deficits     Diagnostics: Spirometry:  Tracings reviewed. Her effort: {Blank single:19197::"Good reproducible efforts.","It was hard to get consistent efforts and there is a question as to whether this reflects a maximal maneuver.","Poor effort, data can not be interpreted.","Variable effort-results affected.","decent for first attempt at spirometry."} FVC: ***L (pre), ***L  (post) FEV1: ***L, ***% predicted (pre), ***L, ***% predicted (post) FEV1/FVC ratio: ***% (pre), ***% (post) Interpretation: {Blank single:19197::"Spirometry  consistent with mild obstructive disease","Spirometry consistent with moderate obstructive disease","Spirometry consistent with severe obstructive disease","Spirometry consistent  with possible restrictive disease","Spirometry consistent with mixed obstructive and restrictive disease","Spirometry uninterpretable due to technique","Spirometry consistent with normal pattern","No overt abnormalities noted given today's efforts"} with *** bronchodilator response  Skin Testing: {Blank single:19197::"Select foods","Environmental allergy panel","Environmental allergy panel and select foods","Food allergy panel","None","Deferred due to recent antihistamines use"}. *** Adequate controls. Results discussed with patient/family.   {Blank single:19197::"Allergy testing results were read and interpreted by myself, documented by clinical staff."," "}  Assessment and Plan  There are no Patient Instructions on file for this visit.  {Blank single:19197::"This note in its entirety was forwarded to the Provider who requested this consultation."}  Thank you for your kind referral. I appreciate the opportunity to take part in Amita's care. Please do not hesitate to contact me with questions.***  Sincerely,  Sigurd Sos, MD [facility]

## 2021-11-09 ENCOUNTER — Ambulatory Visit: Payer: Medicaid Other | Admitting: Internal Medicine

## 2021-11-25 ENCOUNTER — Emergency Department (HOSPITAL_COMMUNITY): Payer: Medicaid Other

## 2021-11-25 ENCOUNTER — Encounter (HOSPITAL_COMMUNITY): Payer: Self-pay | Admitting: Emergency Medicine

## 2021-11-25 ENCOUNTER — Emergency Department (HOSPITAL_COMMUNITY)
Admission: EM | Admit: 2021-11-25 | Discharge: 2021-11-26 | Disposition: A | Discharge status: Home / Self Care | Payer: Medicaid Other | Attending: Emergency Medicine | Admitting: Emergency Medicine

## 2021-11-25 ENCOUNTER — Other Ambulatory Visit: Payer: Self-pay

## 2021-11-25 ENCOUNTER — Ambulatory Visit
Admission: EM | Admit: 2021-11-25 | Discharge: 2021-11-25 | Disposition: A | Discharge status: Home / Self Care | Payer: Medicaid Other

## 2021-11-25 DIAGNOSIS — R079 Chest pain, unspecified: Secondary | ICD-10-CM

## 2021-11-25 DIAGNOSIS — R072 Precordial pain: Secondary | ICD-10-CM | POA: Insufficient documentation

## 2021-11-25 DIAGNOSIS — R0789 Other chest pain: Secondary | ICD-10-CM | POA: Diagnosis not present

## 2021-11-25 LAB — BASIC METABOLIC PANEL
Anion gap: 10 (ref 5–15)
BUN: 13 mg/dL (ref 6–20)
CO2: 25 mmol/L (ref 22–32)
Calcium: 9.5 mg/dL (ref 8.9–10.3)
Chloride: 102 mmol/L (ref 98–111)
Creatinine, Ser: 1.01 mg/dL — ABNORMAL HIGH (ref 0.44–1.00)
GFR, Estimated: >60 mL/min (ref >60)
Glucose, Bld: 87 mg/dL (ref 70–99)
Potassium: 4 mmol/L (ref 3.5–5.1)
Sodium: 137 mmol/L (ref 135–145)

## 2021-11-25 LAB — CBC
HCT: 39.4 % (ref 36.0–46.0)
Hemoglobin: 12 g/dL (ref 12.0–15.0)
MCH: 25.1 pg — ABNORMAL LOW (ref 26.0–34.0)
MCHC: 30.5 g/dL (ref 30.0–36.0)
MCV: 82.3 fL (ref 80.0–100.0)
Platelets: 291 10*3/uL (ref 150–400)
RBC: 4.79 MIL/uL (ref 3.87–5.11)
RDW: 12.9 % (ref 11.5–15.5)
WBC: 9.4 10*3/uL (ref 4.0–10.5)
nRBC: 0 % (ref 0.0–0.2)

## 2021-11-25 LAB — TROPONIN I (HIGH SENSITIVITY): Troponin I (High Sensitivity): 6 ng/L (ref <18)

## 2021-11-25 LAB — I-STAT BETA HCG BLOOD, ED (MC, WL, AP ONLY): I-stat hCG, quantitative: <5 m[IU]/mL (ref <5)

## 2021-11-25 NOTE — ED Triage Notes (Signed)
Pt reported ot ED with c/o intermittent chest pain since Thursday. Pt states that pain is on left side of chest, does not radiate into arm, neck or jaw and denies any n/v. Endorses intermittent ShOB that is accompanied with chest pain.

## 2021-11-25 NOTE — Discharge Instructions (Signed)
Please go to the hospital as soon as you leave urgent care for further evaluation and management. 

## 2021-11-25 NOTE — ED Provider Notes (Signed)
[name]    CSN: [id number] Arrival date & time: 11/25/21  1800      History   Chief Complaint Chief Complaint  Patient presents with   Chest Pain    HPI Joyce Welch is a 51 y.o. female.   Patient presents with chest pain that has been intermittent since yesterday.  She reports that she had 3 episodes of chest pain, and the last episode is current at this time.  Chest pain is described as a pressure per patient.  She took some aspirin on first episode of chest pain with resolution of pain.  She has not taken any aspirin since the first episode yesterday morning.  She denies any associated shortness of breath.  She was concerned for asthma exacerbation but reports that she does not typically have chest pain with this.  Chest pain increases with inspiration and expiration.  Denies any known fevers or sick contacts.  Denies any history of heart problems.   Chest Pain  Past Medical History:  Diagnosis Date   Asthma    Chronic cough    COVID-19 09/18/2021   Fibroids    GERD (gastroesophageal reflux disease)    Hypertension    OAB (overactive bladder)    UTI (urinary tract infection)     Patient Active Problem List   Diagnosis Date Noted   Paresthesia 10/17/2018   Weakness 10/17/2018   Cough 06/20/2018   Post-nasal drip 06/20/2018   Globus sensation 06/20/2018   Asthma exacerbation 10/31/2017   Leukocytosis 10/31/2017   Strain of quadriceps tendon 02/14/2014   Symptomatic menopausal or female climacteric states 07/31/2013    Past Surgical History:  Procedure Laterality Date   ABDOMINAL HYSTERECTOMY  02/05/2006   partial   CESAREAN SECTION  1994, 2002, 2005,    x 3   MAXILLARY ANTROSTOMY Right 10/07/2018   Procedure: RIGHT MAXILLARY ANTROSTOMY WITH TISSUE REMOVAL.;  Surgeon: Melida Quitter, MD;  Location: [facility];  Service: ENT;  Laterality: Right;    OB History     Gravida  3   Para  3   Term  1   Preterm      AB       Living  3      SAB      IAB      Ectopic      Multiple      Live Births  3            Home Medications    Prior to Admission medications   Medication Sig Start Date End Date Taking? Authorizing Provider  albuterol (PROVENTIL) (2.5 MG/3ML) 0.083% nebulizer solution Take 3 mLs (2.5 mg total) by nebulization every 6 (six) hours as needed for wheezing or shortness of breath. 10/28/20   Jaynee Eagles, PA-C  albuterol (VENTOLIN HFA) 108 (90 Base) MCG/ACT inhaler Inhale 1 puff into the lungs every 6 (six) hours as needed for wheezing or shortness of breath. 10/28/20   Jaynee Eagles, PA-C  amLODipine (NORVASC) 5 MG tablet Take 5 mg by mouth daily.    [provider]  amoxicillin-clavulanate (AUGMENTIN) 875-125 MG tablet Take 1 tablet by mouth every 12 (twelve) hours. 07/31/21   Francene Finders, PA-C  azelastine (ASTELIN) 0.1 % nasal spray Place 1 spray into both nostrils 2 (two) times daily. Use in each nostril as directed Patient taking differently: Place 1 spray into both nostrils See admin instructions. Instill 1 spray into each nostril two times a day as  directed 06/04/18   [name], MD  benzonatate (TESSALON) 100 MG capsule Take 1-2 capsules (100-200 mg total) by mouth 3 (three) times daily as needed for cough. Patient not taking: Reported on 05/29/2021 10/28/20   Jaynee Eagles, PA-C  cyclobenzaprine (FLEXERIL) 10 MG tablet Take 1 tablet (10 mg total) by mouth 2 (two) times daily as needed for muscle spasms. 08/28/21   Francene Finders, PA-C  diclofenac (VOLTAREN) 75 MG EC tablet Take 1 tablet (75 mg total) by mouth 2 (two) times daily. 10/06/19   Dene Gentry, MD  DULERA 100-5 MCG/ACT AERO INHALE 2 PUFFS INTO THE LUNGS TWICE DAILY 11/15/18   Fenton Foy, NP  erythromycin ophthalmic ointment Place a 1/2 inch ribbon of ointment into the lower eyelid 4 times daily for 7 days. 08/06/21   Teodora Medici, FNP  fluticasone (FLONASE) 50 MCG/ACT nasal spray Place 2 sprays into both  nostrils daily.     [provider]  loratadine (CLARITIN) 10 MG tablet Take 10 mg by mouth daily.    [provider]  NOREL AD 4-10-325 MG TABS Take 1 tablet by mouth 2 (two) times daily as needed. 02/20/20   [provider]  oxybutynin (DITROPAN) 5 MG tablet Take 5 mg by mouth 2 (two) times daily. 11/07/21   [provider]  pantoprazole (PROTONIX) 40 MG tablet Take 40 mg by mouth every morning. 09/07/19   [provider]  promethazine-dextromethorphan (PROMETHAZINE-DM) 6.25-15 MG/5ML syrup Take 5 mLs by mouth at bedtime as needed for cough. Patient not taking: Reported on 05/29/2021 10/28/20   Jaynee Eagles, PA-C  sucralfate (CARAFATE) 1 g tablet Take 1 tablet (1 g total) by mouth 4 (four) times daily. 06/22/19   Lacretia Leigh, MD  tiZANidine (ZANAFLEX) 4 MG tablet 2 (two) times daily. 05/20/19   [provider]  Vitamin D, Ergocalciferol, (DRISDOL) 1.25 MG (50000 UT) CAPS capsule Take 50,000 Units by mouth once a week. 05/16/19   [provider]    Family History Family History  Problem Relation Age of Onset   Hypertension Mother    Other Father        unsure of medical history   Breast cancer Maternal Aunt     Social History Social History   Tobacco Use   Smoking status: Never   Smokeless tobacco: Never  Vaping Use   Vaping Use: Never used  Substance Use Topics   Alcohol use: Yes    Comment: socially   Drug use: Never     Allergies   Patient has no known allergies.   Review of Systems Review of Systems Per HPI  Physical Exam Triage Vital Signs ED Triage Vitals [11/25/21 1814]  Enc Vitals Group     BP 134/83     Pulse Rate 96     Resp 18     Temp 98.3 F (36.8 C)     Temp Source Oral     SpO2 98 %     Weight      Height      Head Circumference      Peak Flow      Pain Score 0     Pain Loc      Pain Edu?      Excl. in [place]?    No data found.  Updated Vital Signs BP 134/83 (BP Location: Right Arm)     Pulse 96    Temp 98.3 F (36.8 C) (Oral)    Resp 18  SpO2 98%   Visual Acuity Right Eye Distance:   Left Eye Distance:   Bilateral Distance:    Right Eye Near:   Left Eye Near:    Bilateral Near:     Physical Exam Constitutional:      General: She is not in acute distress.    Appearance: Normal appearance. She is not toxic-appearing or diaphoretic.  HENT:     Head: Normocephalic and atraumatic.  Eyes:     Extraocular Movements: Extraocular movements intact.     Conjunctiva/sclera: Conjunctivae normal.  Cardiovascular:     Rate and Rhythm: Normal rate and regular rhythm.     Pulses: Normal pulses.     Heart sounds: Normal heart sounds.  Pulmonary:     Effort: Pulmonary effort is normal. No respiratory distress.     Breath sounds: Normal breath sounds. No wheezing.  Neurological:     General: No focal deficit present.     Mental Status: She is alert and oriented to person, place, and time. Mental status is at baseline.  Psychiatric:        Mood and Affect: Mood normal.        Behavior: Behavior normal.        Thought Content: Thought content normal.        Judgment: Judgment normal.     UC Treatments / Results  Labs (all labs ordered are listed, but only abnormal results are displayed) Labs Reviewed - No data to display  EKG   Radiology No results found.  Procedures Procedures (including critical care time)  Medications Ordered in UC Medications - No data to display  Initial Impression / Assessment and Plan / UC Course  I have reviewed the triage vital signs and the nursing notes.  Pertinent labs & imaging results that were available during my care of the patient were reviewed by me and considered in my medical decision making (see chart for details).     EKG was fairly unremarkable.  There is low suspicion for asthma exacerbation given no wheezing or shortness of breath on exam.  Advised patient that I do think she needs a more extensive evaluation  given chest pain.  Advised patient to go to the ER for further evaluation and management.  Patient was agreeable with plan.  Suggested EMS transport but patient declined.  Risks associated with not going by EMS were discussed with patient.  Patient voiced understanding.  Patient left via self transport. Final Clinical Impressions(s) / UC Diagnoses   Final diagnoses:  Other chest pain     Discharge Instructions      Please go to the hospital as soon as you leave urgent care for further evaluation and management.    ED Prescriptions   None    PDMP not reviewed this encounter.   Teodora Medici, [place] 11/25/21 1920

## 2021-11-25 NOTE — ED Triage Notes (Signed)
Pt c/o intermittent chest pain onset yesterday. Was not able to be seen by PCP yesterday so came to [facility] today. States took ASA yesterday but not today. Denies dyspnea.

## 2021-11-26 ENCOUNTER — Emergency Department (HOSPITAL_COMMUNITY): Payer: Medicaid Other

## 2021-11-26 LAB — TROPONIN I (HIGH SENSITIVITY): Troponin I (High Sensitivity): 6 ng/L (ref <18)

## 2021-11-26 MED ORDER — IOHEXOL 350 MG/ML SOLN
50.0000 mL | Freq: Once | INTRAVENOUS | Status: AC | PRN
  Administered 2021-11-26: 50 mL via INTRAVENOUS

## 2021-11-26 NOTE — ED Provider Notes (Signed)
[facility] EMERGENCY DEPARTMENT Provider Note   CSN: [id number] Arrival date & time: 11/25/21  1926     History  Chief Complaint  Patient presents with   Chest Pain    Eladia Frame is a 51 y.o. female.   Chest Pain Pain location:  Substernal area and L chest Pain quality comment:  Squeezing Pain radiates to:  Does not radiate Pain severity:  Mild Onset quality:  Gradual Duration:  1 day Timing:  Intermittent Chronicity:  Recurrent Relieved by:  None tried Worsened by:  Nothing     Home Medications Prior to Admission medications   Medication Sig Start Date End Date Taking? Authorizing Provider  albuterol (PROVENTIL) (2.5 MG/3ML) 0.083% nebulizer solution Take 3 mLs (2.5 mg total) by nebulization every 6 (six) hours as needed for wheezing or shortness of breath. 10/28/20   Jaynee Eagles, PA-C  albuterol (VENTOLIN HFA) 108 (90 Base) MCG/ACT inhaler Inhale 1 puff into the lungs every 6 (six) hours as needed for wheezing or shortness of breath. 10/28/20   Jaynee Eagles, PA-C  amLODipine (NORVASC) 5 MG tablet Take 5 mg by mouth daily.    [provider]  amoxicillin-clavulanate (AUGMENTIN) 875-125 MG tablet Take 1 tablet by mouth every 12 (twelve) hours. 07/31/21   Francene Finders, PA-C  azelastine (ASTELIN) 0.1 % nasal spray Place 1 spray into both nostrils 2 (two) times daily. Use in each nostril as directed Patient taking differently: Place 1 spray into both nostrils See admin instructions. Instill 1 spray into each nostril two times a day as directed 06/04/18   [name], MD  benzonatate (TESSALON) 100 MG capsule Take 1-2 capsules (100-200 mg total) by mouth 3 (three) times daily as needed for cough. Patient not taking: Reported on 05/29/2021 10/28/20   Jaynee Eagles, PA-C  cyclobenzaprine (FLEXERIL) 10 MG tablet Take 1 tablet (10 mg total) by mouth 2 (two) times daily as needed for muscle spasms. 08/28/21   Francene Finders, PA-C  diclofenac  (VOLTAREN) 75 MG EC tablet Take 1 tablet (75 mg total) by mouth 2 (two) times daily. 10/06/19   Dene Gentry, MD  DULERA 100-5 MCG/ACT AERO INHALE 2 PUFFS INTO THE LUNGS TWICE DAILY 11/15/18   Fenton Foy, NP  erythromycin ophthalmic ointment Place a 1/2 inch ribbon of ointment into the lower eyelid 4 times daily for 7 days. 08/06/21   Teodora Medici, FNP  fluticasone (FLONASE) 50 MCG/ACT nasal spray Place 2 sprays into both nostrils daily.     [provider]  loratadine (CLARITIN) 10 MG tablet Take 10 mg by mouth daily.    [provider]  NOREL AD 4-10-325 MG TABS Take 1 tablet by mouth 2 (two) times daily as needed. 02/20/20   [provider]  oxybutynin (DITROPAN) 5 MG tablet Take 5 mg by mouth 2 (two) times daily. 11/07/21   [provider]  pantoprazole (PROTONIX) 40 MG tablet Take 40 mg by mouth every morning. 09/07/19   [provider]  promethazine-dextromethorphan (PROMETHAZINE-DM) 6.25-15 MG/5ML syrup Take 5 mLs by mouth at bedtime as needed for cough. Patient not taking: Reported on 05/29/2021 10/28/20   Jaynee Eagles, PA-C  sucralfate (CARAFATE) 1 g tablet Take 1 tablet (1 g total) by mouth 4 (four) times daily. 06/22/19   Lacretia Leigh, MD  tiZANidine (ZANAFLEX) 4 MG tablet 2 (two) times daily. 05/20/19   [provider]  Vitamin D, Ergocalciferol, (DRISDOL) 1.25 MG (50000 UT) CAPS capsule Take  50,000 Units by mouth once a week. 05/16/19   [provider]      Allergies    Patient has no known allergies.    Review of Systems   Review of Systems  Cardiovascular:  Positive for chest pain.   Physical Exam Updated Vital Signs BP 117/70    Pulse 62    Temp 98 F (36.7 C) (Oral)    Resp 20    SpO2 99%  Physical Exam Vitals and nursing note reviewed.  Constitutional:      Appearance: She is well-developed.  HENT:     Head: Normocephalic and atraumatic.  Cardiovascular:     Rate and Rhythm: Normal rate and regular  rhythm.  Pulmonary:     Effort: Pulmonary effort is normal. No tachypnea, accessory muscle usage or respiratory distress.     Breath sounds: No stridor. No decreased breath sounds or wheezing.  Chest:     Chest wall: No mass.  Abdominal:     General: There is no distension.     Palpations: Abdomen is soft.  Musculoskeletal:        General: Normal range of motion.     Cervical back: Normal range of motion.     Right lower leg: No edema.     Left lower leg: No edema.  Skin:    General: Skin is warm and dry.  Neurological:     Mental Status: She is alert.    ED Results / Procedures / Treatments   Labs (all labs ordered are listed, but only abnormal results are displayed) Labs Reviewed  BASIC METABOLIC PANEL - Abnormal; Notable for the following components:      Result Value   Creatinine, Ser 1.01 (*)    All other components within normal limits  CBC - Abnormal; Notable for the following components:   MCH 25.1 (*)    All other components within normal limits  I-STAT BETA HCG BLOOD, ED ([facility], [facility], [facility] ONLY)  TROPONIN I (HIGH SENSITIVITY)  TROPONIN I (HIGH SENSITIVITY)    EKG None  Radiology DG Chest 2 View  Result Date: 11/25/2021 CLINICAL DATA:  Intermittent chest pain and shortness of breath over the last 2 days. EXAM: CHEST - 2 VIEW COMPARISON:  07/23/2021 FINDINGS: The heart size and mediastinal contours are within normal limits. Both lungs are clear. The visualized skeletal structures are unremarkable. IMPRESSION: No active cardiopulmonary disease. Electronically Signed   By: Nelson Chimes M.D.   On: 11/25/2021 20:45   CT Angio Chest PE W and/or Wo Contrast  Result Date: 11/26/2021 CLINICAL DATA:  Pulmonary embolism (PE) suspected, high prob. Chest pain. EXAM: CT ANGIOGRAPHY CHEST WITH CONTRAST TECHNIQUE: Multidetector CT imaging of the chest was performed using the standard protocol during bolus administration of intravenous contrast. Multiplanar CT image reconstructions and  MIPs were obtained to evaluate the vascular anatomy. RADIATION DOSE REDUCTION: This exam was performed according to the departmental dose-optimization program which includes automated exposure control, adjustment of the mA and/or kV according to patient size and/or use of iterative reconstruction technique. CONTRAST:  35mL OMNIPAQUE IOHEXOL 350 MG/ML SOLN COMPARISON:  07/29/2018 FINDINGS: Cardiovascular: Adequate opacification of the pulmonary arterial tree. No intraluminal filling defect identified to suggest acute pulmonary embolism. Central pulmonary arteries are of normal caliber. No significant coronary artery calcification. Cardiac size within normal limits. No pericardial effusion. No significant atherosclerotic calcification within the thoracic aorta. No aortic aneurysm. Mediastinum/Nodes: No enlarged mediastinal, hilar, or axillary lymph nodes. Thyroid gland, trachea, and esophagus  demonstrate no significant findings. Lungs/Pleura: Lungs are clear. No pleural effusion or pneumothorax. Upper Abdomen: No acute abnormality. Musculoskeletal: No acute bone abnormality. No lytic or blastic bone lesion. Review of the MIP images confirms the above findings. IMPRESSION: No pulmonary embolism.  No acute intrathoracic pathology identified. Electronically Signed   By: Fidela Salisbury M.D.   On: 11/26/2021 01:51    Procedures Procedures    Medications Ordered in ED Medications  iohexol (OMNIPAQUE) 350 MG/ML injection 50 mL (50 mLs Intravenous Contrast Given 11/26/21 0143)    ED Course/ Medical Decision Making/ A&P Clinical Course as of 11/26/21 4103  Sat Nov 26, 2021  0006 ED EKG [JM]    Clinical Course User Index [JM] Jackalyn Haith, Corene Cornea, MD                           Medical Decision Making Amount and/or Complexity of Data Reviewed Labs: ordered. Radiology: ordered. ECG/medicine tests:  Decision-making details documented in ED Course.  Risk Prescription drug management.   Atypical chest pain. CT  scan negative, no PE. Delta troponins negative, ecg ok without e/o ACS. No other obvious emergent causes. Will fu w/ Cards for further management.    Final Clinical Impression(s) / ED Diagnoses Final diagnoses:  Nonspecific chest pain    Rx / DC Orders ED Discharge Orders          Ordered    Ambulatory referral to Cardiology        11/26/21 0238              Kristien Salatino, Corene Cornea, MD 11/26/21 [phone]

## 2021-12-06 ENCOUNTER — Encounter: Payer: Self-pay | Admitting: Cardiology

## 2021-12-06 ENCOUNTER — Ambulatory Visit (INDEPENDENT_AMBULATORY_CARE_PROVIDER_SITE_OTHER): Payer: Medicaid Other | Admitting: Cardiology

## 2021-12-06 ENCOUNTER — Other Ambulatory Visit: Payer: Self-pay

## 2021-12-06 DIAGNOSIS — R011 Cardiac murmur, unspecified: Secondary | ICD-10-CM

## 2021-12-06 DIAGNOSIS — R079 Chest pain, unspecified: Secondary | ICD-10-CM | POA: Diagnosis not present

## 2021-12-06 MED ORDER — METOPROLOL TARTRATE 100 MG PO TABS: 100.0000 mg | ORAL_TABLET | ORAL | 0 refills | Status: DC

## 2021-12-06 NOTE — Patient Instructions (Signed)
Medication Instructions:  ?The current medical regimen is effective;  continue present plan and medications. ? ?*If you need a refill on your cardiac medications before your next appointment, please call your pharmacy* ? ? ?Lab Work: ?None today ?If you have labs (blood work) drawn today and your tests are completely normal, you will receive your results only by: ?MyChart Message (if you have MyChart) OR ?A paper copy in the mail ?If you have any lab test that is abnormal or we need to change your treatment, we will call you to review the results. ? ? ?Testing/Procedures: ?Your physician has requested that you have an echocardiogram. Echocardiography is a painless test that uses sound waves to create images of your heart. It provides your doctor with information about the size and shape of your heart and how well your heart?s chambers and valves are working. This procedure takes approximately one hour. There are no restrictions for this procedure. ? ? ? ?Your cardiac CT will be scheduled at one of the below locations:  ? ?[facility] ?[address] ?[place], [place] [address] ?[phone] ? ?Please arrive at the [facility] and Children's Entrance (Entrance C2) of [facility] 30 minutes prior to test start time. ?You can use the FREE valet parking offered at entrance C (encouraged to control the heart rate for the test)  ?Proceed to the [facility] Radiology Department (first floor) to check-in and test prep. ? ?All radiology patients and guests should use entrance C2 at [facility], accessed from [facility], even though the hospital's physical address listed is [address] ? ? ? ?Please follow these instructions carefully (unless otherwise directed): ? ?On the Night Before the Test: ?Be sure to Drink plenty of water. ?Do not consume any caffeinated/decaffeinated beverages or chocolate 12 hours prior to your test. ?Do not take any antihistamines 12 hours prior to  your test. ? ?On the Day of the Test: ?Drink plenty of water until 1 hour prior to the test. ?Do not eat any food 4 hours prior to the test. ?You may take your regular medications prior to the test.  ?Take metoprolol (Lopressor) two hours prior to test. ?HOLD Furosemide/Hydrochlorothiazide morning of the test. ?FEMALES- please wear underwire-free bra if available, avoid dresses & tight clothin ?     ?After the Test: ?Drink plenty of water. ?After receiving IV contrast, you may experience a mild flushed feeling. This is normal. ?On occasion, you may experience a mild rash up to 24 hours after the test. This is not dangerous. If this occurs, you can take Benadryl 25 mg and increase your fluid intake. ?If you experience trouble breathing, this can be serious. If it is severe call 911 IMMEDIATELY. If it is mild, please call our office. ?If you take any of these medications: Glipizide/Metformin, Avandament, Glucavance, please do not take 48 hours after completing test unless otherwise instructed. ? ?We will call to schedule your test 2-4 weeks out understanding that some insurance companies will need an authorization prior to the service being performed.  ? ?For non-scheduling related questions, please contact the cardiac imaging nurse navigator should you have any questions/concerns: ?Marchia Bond, Cardiac Imaging Nurse Navigator ?Gordy Clement, Cardiac Imaging Nurse Navigator ?[facility] ?Direct Office Dial: [phone]  ? ?For scheduling needs, including cancellations and rescheduling, please call [place], [phone]. ? ?Follow-Up: ?At [facility], you and your health needs are our priority.  As part of our continuing mission to  provide you with exceptional heart care, we have created designated Provider Care Teams.  These Care Teams include your primary Cardiologist (physician) and Advanced Practice Providers (APPs -  Physician Assistants and Nurse Practitioners) who all work  together to provide you with the care you need, when you need it. ? ?We recommend signing up for the patient portal called "MyChart".  Sign up information is provided on this After Visit Summary.  MyChart is used to connect with patients for Virtual Visits (Telemedicine).  Patients are able to view lab/test results, encounter notes, upcoming appointments, etc.  Non-urgent messages can be sent to your provider as well.   ?To learn more about what you can do with MyChart, go to [email or web address].   ? ?Your next appointment:   ?Follow up to be determined after the above testing. ? ?Thank you for choosing [facility]!! ? ? ? ?

## 2021-12-06 NOTE — Assessment & Plan Note (Signed)
We will go ahead and perform a coronary CT scan to ensure that she does not have any obstructive CAD.  Other possibilities include musculoskeletal discomfort.  There is no evidence of pericardial effusion on CT scan.  Pain change with inhalation at times can be musculoskeletal.  Inflammatory. ?

## 2021-12-06 NOTE — Assessment & Plan Note (Signed)
We will check an echocardiogram to evaluate heart murmur. ?

## 2021-12-06 NOTE — Progress Notes (Signed)
Cardiology Office Note:    Date:  12/06/2021   ID:  Joyce Welch, DOB Jan 07, 1971, MRN [id number]  PCP:  [name], MD   [facility] Providers Cardiologist:  None     Referring MD: Merrily Pew, MD    History of Present Illness:    Joyce Welch is a this chest pain was subsequently normal squeezing it seemed to be intermittent and recurrent 51 y.o. female here for the evaluation of chest pain at the request of Dr. Dayna Barker.  No radiation to arm neck or jaw.  No nausea or vomiting.  Intermittent shortness of breath noted with chest pain. Felt it for one week. When lay down and walk, when inhale she will feel it. Sharp pain at night. Took ASA. One day happened at work while serving on [employer]   CT of chest - no calcifications. No PE.   She is a non-smoker. No early family history of coronary artery disease.  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  Past Medical History:  Diagnosis Date   Asthma    Chronic cough    COVID-19 09/18/2021   Fibroids    GERD (gastroesophageal reflux disease)    Hypertension    OAB (overactive bladder)    UTI (urinary tract infection)     Past Surgical History:  Procedure Laterality Date   ABDOMINAL HYSTERECTOMY  02/05/2006   partial   CESAREAN SECTION  1994, 2002, 2005,    x 3   MAXILLARY ANTROSTOMY Right 10/07/2018   Procedure: RIGHT MAXILLARY ANTROSTOMY WITH TISSUE REMOVAL.;  Surgeon: Melida Quitter, MD;  Location: [place];  Service: ENT;  Laterality: Right;    Current Medications: Current Meds  Medication Sig   albuterol (PROVENTIL) (2.5 MG/3ML) 0.083% nebulizer solution Take 3 mLs (2.5 mg total) by nebulization every 6 (six) hours as needed for wheezing or shortness of breath.   amLODipine (NORVASC) 5 MG tablet Take 5 mg by mouth daily.   azelastine (ASTELIN) 0.1 % nasal spray Place 1 spray into both nostrils 2 (two) times daily. Use in each nostril as directed (Patient taking differently:  Place 1 spray into both nostrils See admin instructions. Instill 1 spray into each nostril two times a day as directed)   DULERA 100-5 MCG/ACT AERO INHALE 2 PUFFS INTO THE LUNGS TWICE DAILY   fluticasone (FLONASE) 50 MCG/ACT nasal spray Place 2 sprays into both nostrils daily.    loratadine (CLARITIN) 10 MG tablet Take 10 mg by mouth daily.   metoprolol tartrate (LOPRESSOR) 100 MG tablet Take 1 tablet (100 mg total) by mouth as directed. Take 1 tablet 2 hours before your ct scan.   oxybutynin (DITROPAN) 5 MG tablet Take 5 mg by mouth 2 (two) times daily.   pantoprazole (PROTONIX) 40 MG tablet Take 40 mg by mouth every morning.   Vitamin D, Ergocalciferol, (DRISDOL) 1.25 MG (50000 UT) CAPS capsule Take 50,000 Units by mouth once a week.     Allergies:   Patient has no known allergies.   Social History   Socioeconomic History   Marital status: Single    Spouse name: Not on file   Number of children: 3   Years of education: 12   Highest education level: High school graduate  Occupational History   Occupation: Mudlogger   Tobacco Use   Smoking status: Never   Smokeless tobacco: Never  Vaping Use   Vaping Use: Never used  Substance and Sexual Activity   Alcohol use: Yes  Comment: socially   Drug use: Never   Sexual activity: Yes    Partners: Male    Birth control/protection: Surgical  Other Topics Concern   Not on file  Social History Narrative   Lives at home with her daughter.   Right-handed.   Caffeine use:  3 cups daily.   Social Determinants of Health   Financial Resource Strain: Not on file  Food Insecurity: Not on file  Transportation Needs: Not on file  Physical Activity: Not on file  Stress: Not on file  Social Connections: Not on file     Family History: The patient's family history includes Breast cancer in her maternal aunt; Hypertension in her mother; Other in her father.  ROS:   Please see the history of present illness.    No fevers  chills nausea vomiting syncope bleeding all other systems reviewed and are negative.  EKGs/Labs/Other Studies Reviewed:    The following studies were reviewed today: CT scan reviewed, EKG reviewed ER notes reviewed  EKG: Normal sinus rhythm no ischemic changes.  Recent Labs: 11/25/2021: BUN 13; Creatinine, Ser 1.01; Hemoglobin 12.0; Platelets 291; Potassium 4.0; Sodium 137  Recent Lipid Panel    Component Value Date/Time   CHOL 173 11/12/2020 1500   TRIG 147 11/12/2020 1500   HDL 35 (L) 11/12/2020 1500   CHOLHDL 4.9 11/12/2020 1500   LDLCALC 112 (H) 11/12/2020 1500     Risk Assessment/Calculations:              Physical Exam:    VS:  BP 112/76 (BP Location: Left Arm, Patient Position: Sitting, Cuff Size: Large)    Pulse 97    Ht '5\' 6"'$  (1.676 m)    Wt 244 lb (110.7 kg)    SpO2 98%    BMI 39.38 kg/m     Wt Readings from Last 3 Encounters:  12/06/21 244 lb (110.7 kg)  11/08/20 245 lb (111.1 kg)  10/06/19 249 lb (112.9 kg)     GEN:  Well nourished, well developed in no acute distress HEENT: Normal NECK: No JVD; No carotid bruits LYMPHATICS: No lymphadenopathy CARDIAC: RRR, 2/6 systolic murmur, no rubs, gallops RESPIRATORY:  Clear to auscultation without rales, wheezing or rhonchi  ABDOMEN: Soft, non-tender, non-distended MUSCULOSKELETAL:  No edema; No deformity  SKIN: Warm and dry NEUROLOGIC:  Alert and oriented x 3 PSYCHIATRIC:  Normal affect   ASSESSMENT:    1. Chest pain of uncertain etiology   2. Heart murmur    PLAN:    In order of problems listed above:  Chest pain of uncertain etiology We will go ahead and perform a coronary CT scan to ensure that she does not have any obstructive CAD.  Other possibilities include musculoskeletal discomfort.  There is no evidence of pericardial effusion on CT scan.  Pain change with inhalation at times can be musculoskeletal.  Inflammatory.  Heart murmur We will check an echocardiogram to evaluate heart murmur.          Medication Adjustments/Labs and Tests Ordered: Current medicines are reviewed at length with the patient today.  Concerns regarding medicines are outlined above.  Orders Placed This Encounter  Procedures   CT CORONARY MORPH W/CTA COR W/SCORE W/CA W/CM &/OR WO/CM   ECHOCARDIOGRAM COMPLETE   Meds ordered this encounter  Medications   metoprolol tartrate (LOPRESSOR) 100 MG tablet    Sig: Take 1 tablet (100 mg total) by mouth as directed. Take 1 tablet 2 hours before your ct scan.  Dispense:  1 tablet    Refill:  0    Patient Instructions  Medication Instructions:  The current medical regimen is effective;  continue present plan and medications.  *If you need a refill on your cardiac medications before your next appointment, please call your pharmacy*   Lab Work: None today If you have labs (blood work) drawn today and your tests are completely normal, you will receive your results only by: [place] (if you have MyChart) OR A paper copy in the mail If you have any lab test that is abnormal or we need to change your treatment, we will call you to review the results.   Testing/Procedures: Your physician has requested that you have an echocardiogram. Echocardiography is a painless test that uses sound waves to create images of your heart. It provides your doctor with information about the size and shape of your heart and how well your hearts chambers and valves are working. This procedure takes approximately one hour. There are no restrictions for this procedure.    Your cardiac CT will be scheduled at one of the below locations:   [facility] [address] [place], [place] [address] [phone]  Please arrive at the [facility] En[facility] (Entrance C2) of [facility] 30 minutes prior to test start time. You can use the FREE valet parking offered at entrance C (encouraged to control the heart rate for the test)  Proceed to  the [facility] Radiology Department (first floor) to check-in and test prep.  All radiology patients and guests should use entrance C2 at [facility], accessed from [facility], even though the hospital's physical address listed is [address].    Please follow these instructions carefully (unless otherwise directed):  On the Night Before the Test: Be sure to Drink plenty of water. Do not consume any caffeinated/decaffeinated beverages or chocolate 12 hours prior to your test. Do not take any antihistamines 12 hours prior to your test.  On the Day of the Test: Drink plenty of water until 1 hour prior to the test. Do not eat any food 4 hours prior to the test. You may take your regular medications prior to the test.  Take metoprolol (Lopressor) two hours prior to test. HOLD Furosemide/Hydrochlorothiazide morning of the test. FEMALES- please wear underwire-free bra if available, avoid dresses & tight clothin      After the Test: Drink plenty of water. After receiving IV contrast, you may experience a mild flushed feeling. This is normal. On occasion, you may experience a mild rash up to 24 hours after the test. This is not dangerous. If this occurs, you can take Benadryl 25 mg and increase your fluid intake. If you experience trouble breathing, this can be serious. If it is severe call 911 IMMEDIATELY. If it is mild, please call our office. If you take any of these medications: Glipizide/Metformin, Avandament, Glucavance, please do not take 48 hours after completing test unless otherwise instructed.  We will call to schedule your test 2-4 weeks out understanding that some insurance companies will need an authorization prior to the service being performed.   For non-scheduling related questions, please contact the cardiac imaging nurse navigator should you have any questions/concerns: Marchia Bond, Cardiac Imaging Nurse Navigator Gordy Clement, Cardiac  Imaging Nurse Navigator [facility] Direct Office Dial: [phone]   For scheduling needs, including cancellations and rescheduling, please call [place], [phone].  Follow-Up: At [facility], you and your health needs are our priority.  As part of our continuing mission to provide you with exceptional heart care, we have created designated Provider Care Teams.  These Care Teams include your primary Cardiologist (physician) and Advanced Practice Providers (APPs -  Physician Assistants and Nurse Practitioners) who all work together to provide you with the care you need, when you need it.  We recommend signing up for the patient portal called "MyChart".  Sign up information is provided on this After Visit Summary.  MyChart is used to connect with patients for Virtual Visits (Telemedicine).  Patients are able to view lab/test results, encounter notes, upcoming appointments, etc.  Non-urgent messages can be sent to your provider as well.   To learn more about what you can do with MyChart, go to [email or web address].    Your next appointment:   Follow up to be determined after the above testing.  Thank you for choosing [facility]!!      Signed, Candee Furbish, MD  12/06/2021 2:22 PM    [facility]

## 2021-12-07 ENCOUNTER — Other Ambulatory Visit: Payer: Self-pay | Admitting: Cardiology

## 2021-12-11 ENCOUNTER — Other Ambulatory Visit: Payer: Self-pay | Admitting: Cardiology

## 2021-12-12 ENCOUNTER — Other Ambulatory Visit: Payer: Self-pay | Admitting: Cardiology

## 2021-12-13 ENCOUNTER — Other Ambulatory Visit: Payer: Self-pay | Admitting: Cardiology

## 2021-12-14 ENCOUNTER — Other Ambulatory Visit: Payer: Self-pay

## 2021-12-14 ENCOUNTER — Ambulatory Visit
Admission: EM | Admit: 2021-12-14 | Discharge: 2021-12-14 | Disposition: A | Discharge status: Home / Self Care | Payer: Medicaid Other | Attending: Internal Medicine | Admitting: Internal Medicine

## 2021-12-14 DIAGNOSIS — N3001 Acute cystitis with hematuria: Secondary | ICD-10-CM | POA: Insufficient documentation

## 2021-12-14 DIAGNOSIS — R300 Dysuria: Secondary | ICD-10-CM | POA: Insufficient documentation

## 2021-12-14 DIAGNOSIS — R3915 Urgency of urination: Secondary | ICD-10-CM | POA: Insufficient documentation

## 2021-12-14 LAB — POCT URINALYSIS DIP (MANUAL ENTRY)
Bilirubin, UA: NEGATIVE
Glucose, UA: NEGATIVE mg/dL
Ketones, POC UA: NEGATIVE mg/dL
Nitrite, UA: NEGATIVE
Protein Ur, POC: NEGATIVE mg/dL
Spec Grav, UA: 1.015 (ref 1.010–1.025)
Urobilinogen, UA: 0.2 E.U./dL
pH, UA: 6.5 (ref 5.0–8.0)

## 2021-12-14 MED ORDER — NITROFURANTOIN MONOHYD MACRO 100 MG PO CAPS: 100.0000 mg | ORAL_CAPSULE | Freq: Two times a day (BID) | ORAL | 0 refills | Status: DC

## 2021-12-14 NOTE — Discharge Instructions (Signed)
It appears that you have a urinary tract infection so you are being treated with an antibiotic.  Urine culture is pending.  We will call if it is positive and treat as appropriate. ?

## 2021-12-14 NOTE — ED Triage Notes (Signed)
Pt c/o frequency, lower left flank pain, states she feels "pressure when it comes" onset ~ 2 weeks ago.  ?

## 2021-12-14 NOTE — ED Provider Notes (Signed)
?[place] ? ? ? ?CSN: [id number] ?Arrival date & time: 12/14/21  1834 ? ? ?  ? ?History   ?Chief Complaint ?Chief Complaint  ?Patient presents with  ? Urinary Frequency  ? ? ?HPI ?Joyce Welch is a 51 y.o. female.  ? ?Patient presents with urinary frequency, left lower flank pain, abdominal pressure that has been present for approximately 2 weeks.  Denies urinary burning, vaginal discharge, hematuria, abdominal pain, fever. ? ? ?Urinary Frequency ? ? ?Past Medical History:  ?Diagnosis Date  ? Asthma   ? Chronic cough   ? COVID-19 09/18/2021  ? Fibroids   ? GERD (gastroesophageal reflux disease)   ? Hypertension   ? OAB (overactive bladder)   ? UTI (urinary tract infection)   ? ? ?Patient Active Problem List  ? Diagnosis Date Noted  ? Chest pain of uncertain etiology 40/98/1191  ? Heart murmur 12/06/2021  ? Paresthesia 10/17/2018  ? Weakness 10/17/2018  ? Cough 06/20/2018  ? Post-nasal drip 06/20/2018  ? Globus sensation 06/20/2018  ? Asthma exacerbation 10/31/2017  ? Leukocytosis 10/31/2017  ? Strain of quadriceps tendon 02/14/2014  ? Symptomatic menopausal or female climacteric states 07/31/2013  ? ? ?Past Surgical History:  ?Procedure Laterality Date  ? ABDOMINAL HYSTERECTOMY  02/05/2006  ? partial  ? CESAREAN SECTION  1994, 2002, 2005,  ?  x 3  ? MAXILLARY ANTROSTOMY Right 10/07/2018  ? Procedure: RIGHT MAXILLARY ANTROSTOMY WITH TISSUE REMOVAL.;  Surgeon: Melida Quitter, MD;  Location: [place];  Service: ENT;  Laterality: Right;  ? ? ?OB History   ? ? Gravida  ?3  ? Para  ?3  ? Term  ?1  ? Preterm  ?   ? AB  ?   ? Living  ?3  ?  ? ? SAB  ?   ? IAB  ?   ? Ectopic  ?   ? Multiple  ?   ? Live Births  ?3  ?   ?  ?  ? ? ? ?Home Medications   ? ?Prior to Admission medications   ?Medication Sig Start Date End Date Taking? Authorizing Provider  ?nitrofurantoin, macrocrystal-monohydrate, (MACROBID) 100 MG capsule Take 1 capsule (100 mg total) by mouth 2 (two) times daily. 12/14/21  Yes  , Michele Rockers, FNP  ?albuterol (PROVENTIL) (2.5 MG/3ML) 0.083% nebulizer solution Take 3 mLs (2.5 mg total) by nebulization every 6 (six) hours as needed for wheezing or shortness of breath. 10/28/20   Jaynee Eagles, PA-C  ?albuterol (VENTOLIN HFA) 108 (90 Base) MCG/ACT inhaler Inhale 1 puff into the lungs every 6 (six) hours as needed for wheezing or shortness of breath. ?Patient not taking: Reported on 12/06/2021 10/28/20   Jaynee Eagles, PA-C  ?amLODipine (NORVASC) 5 MG tablet Take 5 mg by mouth daily.    [provider]  ?azelastine (ASTELIN) 0.1 % nasal spray Place 1 spray into both nostrils 2 (two) times daily. Use in each nostril as directed ?Patient taking differently: Place 1 spray into both nostrils See admin instructions. Instill 1 spray into each nostril two times a day as directed 06/04/18   [name], MD  ?cyclobenzaprine (FLEXERIL) 10 MG tablet Take 1 tablet (10 mg total) by mouth 2 (two) times daily as needed for muscle spasms. ?Patient not taking: Reported on 12/06/2021 08/28/21   Francene Finders, PA-C  ?diclofenac (VOLTAREN) 75 MG EC tablet Take 1 tablet (75 mg total) by mouth 2 (two) times daily. ?Patient not taking: Reported  on 12/06/2021 10/06/19   Dene Gentry, MD  ?Ruthe Mannan 100-5 MCG/ACT AERO INHALE 2 PUFFS INTO THE LUNGS TWICE DAILY 11/15/18   Fenton Foy, NP  ?erythromycin ophthalmic ointment Place a 1/2 inch ribbon of ointment into the lower eyelid 4 times daily for 7 days. ?Patient not taking: Reported on 12/06/2021 08/06/21   Teodora Medici, FNP  ?fluticasone (FLONASE) 50 MCG/ACT nasal spray Place 2 sprays into both nostrils daily.     [provider]  ?loratadine (CLARITIN) 10 MG tablet Take 10 mg by mouth daily.    [provider]  ?metoprolol tartrate (LOPRESSOR) 100 MG tablet Take 1 tablet (100 mg total) by mouth as directed. Take 1 tablet 2 hours before your ct scan. 12/06/21   Jerline Pain, MD  ?NOREL AD 4-10-325 MG TABS Take 1 tablet by mouth 2 (two) times daily  as needed. ?Patient not taking: Reported on 12/06/2021 02/20/20   [provider]  ?oxybutynin (DITROPAN) 5 MG tablet Take 5 mg by mouth 2 (two) times daily. 11/07/21   [provider]  ?pantoprazole (PROTONIX) 40 MG tablet Take 40 mg by mouth every morning. 09/07/19   [provider]  ?sucralfate (CARAFATE) 1 g tablet Take 1 tablet (1 g total) by mouth 4 (four) times daily. ?Patient not taking: Reported on 12/06/2021 06/22/19   Lacretia Leigh, MD  ?tiZANidine (ZANAFLEX) 4 MG tablet 2 (two) times daily. ?Patient not taking: Reported on 12/06/2021 05/20/19   [provider]  ?Vitamin D, Ergocalciferol, (DRISDOL) 1.25 MG (50000 UT) CAPS capsule Take 50,000 Units by mouth once a week. 05/16/19   [provider]  ? ? ?Family History ?Family History  ?Problem Relation Age of Onset  ? Hypertension Mother   ? Other Father   ?     unsure of medical history  ? Breast cancer Maternal Aunt   ? ? ?Social History ?Social History  ? ?Tobacco Use  ? Smoking status: Never  ? Smokeless tobacco: Never  ?Vaping Use  ? Vaping Use: Never used  ?Substance Use Topics  ? Alcohol use: Yes  ?  Comment: socially  ? Drug use: Never  ? ? ? ?Allergies   ?Patient has no known allergies. ? ? ?Review of Systems ?Review of Systems ?Per HPI ? ?Physical Exam ?Triage Vital Signs ?ED Triage Vitals [12/14/21 1902]  ?Enc Vitals Group  ?   BP 114/71  ?   Pulse Rate 86  ?   Resp 18  ?   Temp 98.1 ?F (36.7 ?C)  ?   Temp Source Oral  ?   SpO2 98 %  ?   Weight   ?   Height   ?   Head Circumference   ?   Peak Flow   ?   Pain Score 0  ?   Pain Loc   ?   Pain Edu?   ?   Excl. in [place]?   ? ?No data found. ? ?Updated Vital Signs ?BP 114/71 (BP Location: Left Arm)   Pulse 86   Temp 98.1 ?F (36.7 ?C) (Oral)   Resp 18   SpO2 98%  ? ?Visual Acuity ?Right Eye Distance:   ?Left Eye Distance:   ?Bilateral Distance:   ? ?Right Eye Near:   ?Left Eye Near:    ?Bilateral Near:    ? ?Physical Exam ?Constitutional:   ?   General: She is not in  acute distress. ?   Appearance: Normal appearance. She is  not toxic-appearing or diaphoretic.  ?HENT:  ?   Head: Normocephalic and atraumatic.  ?Eyes:  ?   Extraocular Movements: Extraocular movements intact.  ?   Conjunctiva/sclera: Conjunctivae normal.  ?Cardiovascular:  ?   Rate and Rhythm: Normal rate and regular rhythm.  ?   Pulses: Normal pulses.  ?   Heart sounds: Normal heart sounds.  ?Pulmonary:  ?   Effort: Pulmonary effort is normal. No respiratory distress.  ?   Breath sounds: Normal breath sounds.  ?Abdominal:  ?   General: Bowel sounds are normal. There is no distension.  ?   Palpations: Abdomen is soft.  ?   Tenderness: There is no abdominal tenderness.  ?Neurological:  ?   General: No focal deficit present.  ?   Mental Status: She is alert and oriented to person, place, and time. Mental status is at baseline.  ?Psychiatric:     ?   Mood and Affect: Mood normal.     ?   Behavior: Behavior normal.     ?   Thought Content: Thought content normal.     ?   Judgment: Judgment normal.  ? ? ? ?UC Treatments / Results  ?Labs ?(all labs ordered are listed, but only abnormal results are displayed) ?Labs Reviewed  ?POCT URINALYSIS DIP (MANUAL ENTRY) - Abnormal; Notable for the following components:  ?    Result Value  ? Clarity, UA cloudy (*)   ? Blood, UA small (*)   ? Leukocytes, UA Moderate (2+) (*)   ? All other components within normal limits  ?URINE CULTURE  ? ? ?EKG ? ? ?Radiology ?No results found. ? ?Procedures ?Procedures (including critical care time) ? ?Medications Ordered in UC ?Medications - No data to display ? ?Initial Impression / Assessment and Plan / UC Course  ?I have reviewed the triage vital signs and the nursing notes. ? ?Pertinent labs & imaging results that were available during my care of the patient were reviewed by me and considered in my medical decision making (see chart for details). ? ?  ? ?Urinalysis indicating urinary tract infection.  Will treat with Macrobid antibiotic.  Urine  culture pending.  Discussed strict return precautions.  Patient verbalized understanding and was agreeable with plan. ?Final Clinical Impressions(s) / UC Diagnoses  ? ?Final diagnoses:  ?Acute cystitis with

## 2021-12-17 LAB — URINE CULTURE: Culture: 10000 — AB

## 2021-12-19 ENCOUNTER — Telehealth: Payer: Self-pay | Admitting: Emergency Medicine

## 2021-12-19 ENCOUNTER — Telehealth (HOSPITAL_COMMUNITY): Payer: Self-pay | Admitting: *Deleted

## 2021-12-19 MED ORDER — SULFAMETHOXAZOLE-TRIMETHOPRIM 800-160 MG PO TABS: 1.0000 | ORAL_TABLET | Freq: Two times a day (BID) | ORAL | 0 refills | Status: AC

## 2021-12-19 NOTE — Addendum Note (Signed)
Addended by: Teodora Medici on: 12/19/2021 08:22 PM ? ? Modules accepted: Orders ? ?

## 2021-12-19 NOTE — Telephone Encounter (Signed)
Attempted to call patient regarding upcoming cardiac CT appointment. °Left message on voicemail with name and callback number ° °Joreen Swearingin RN Navigator Cardiac Imaging °[facility] °[phone] Office °[phone] Cell ° °

## 2021-12-19 NOTE — Telephone Encounter (Signed)
Stop Macrobid.  Start Bactrim given urine culture results and patient's persistent symptoms.  Unable to do Cipro given patient's heart history and worry for QT prolongation.  Creatinine clearance appears normal so Bactrim should be safe. ?

## 2021-12-20 ENCOUNTER — Other Ambulatory Visit: Payer: Self-pay

## 2021-12-20 ENCOUNTER — Encounter (HOSPITAL_COMMUNITY): Payer: Self-pay | Admitting: Emergency Medicine

## 2021-12-20 ENCOUNTER — Ambulatory Visit (HOSPITAL_COMMUNITY)
Admission: RE | Admit: 2021-12-20 | Discharge: 2021-12-20 | Disposition: A | Discharge status: Home / Self Care | Payer: Medicaid Other | Source: Ambulatory Visit | Attending: Cardiology | Admitting: Cardiology

## 2021-12-20 DIAGNOSIS — R079 Chest pain, unspecified: Secondary | ICD-10-CM | POA: Insufficient documentation

## 2021-12-20 MED ORDER — METOPROLOL TARTRATE 5 MG/5ML IV SOLN
INTRAVENOUS | Status: AC
  Administered 2021-12-20: 5 mg via INTRAVENOUS
  Filled 2021-12-20: qty 10

## 2021-12-20 MED ORDER — NITROGLYCERIN 0.4 MG SL SUBL
SUBLINGUAL_TABLET | SUBLINGUAL | Status: AC
Start: 2021-12-20 — End: 2021-12-20
  Administered 2021-12-20: 0.8 mg via SUBLINGUAL
  Filled 2021-12-20: qty 2

## 2021-12-20 MED ORDER — IOHEXOL 350 MG/ML SOLN
95.0000 mL | Freq: Once | INTRAVENOUS | Status: AC | PRN
  Administered 2021-12-20: 95 mL via INTRAVENOUS

## 2021-12-20 MED ORDER — METOPROLOL TARTRATE 5 MG/5ML IV SOLN: 5.0000 mg | INTRAVENOUS | Status: DC | PRN

## 2021-12-20 MED ORDER — NITROGLYCERIN 0.4 MG SL SUBL
0.8000 mg | SUBLINGUAL_TABLET | Freq: Once | SUBLINGUAL | Status: AC
Start: 2021-12-20 — End: 2021-12-20

## 2021-12-20 NOTE — Discharge Instructions (Signed)
Joyce Welch was seen and treated in our radiology department on 12/20/2021. ?She may return to work on 12/21/2021. ? ? ?[facility] Radiology Department  ?[phone] ?

## 2021-12-22 ENCOUNTER — Ambulatory Visit (HOSPITAL_COMMUNITY): Payer: Medicaid Other | Attending: Cardiovascular Disease

## 2021-12-22 ENCOUNTER — Other Ambulatory Visit: Payer: Self-pay

## 2021-12-22 DIAGNOSIS — R011 Cardiac murmur, unspecified: Secondary | ICD-10-CM | POA: Diagnosis present

## 2021-12-22 LAB — ECHOCARDIOGRAM COMPLETE
Area-P 1/2: 8.07 cm2
S' Lateral: 2 cm

## 2021-12-26 NOTE — Progress Notes (Signed)
? ?NEW PATIENT ?Date of Service/Encounter:  12/28/21 ?Referring provider: Melida Quitter, MD ?Primary care provider: [name], MD ? ?Subjective:  ?Joyce Welch is a 51 y.o. female with a PMHx of asthma presenting today for evaluation of chronic cough. ?History obtained from: chart review and patient. ?  ?Cough: Every time she gets a cold, she gets significant drainage down the back of her throat, it leads to a chronic cough.  She has seen multiple specialist for this problem though unable to provide specific details regarding these previous encounters. ?She does feel the cough is coming from drainage in her throat.  She has a history of asthma, but feels this is controlled. ? ?She has had sinus surgery in January 2020-right maxillary antrostomy with removal of the fungal ball.  Per her ENT notes, she did have some improvement in her cough following surgery, but this had already returned back February of that year. ?Sinus CT 07/29/18-IMPRESSION: ?Moderate right maxillary sinus mucosal thickening. ?CT chest: 07/29/18: 1. Unremarkable CT examination the chest. No pulmonary abnormalities ?and no mediastinal or hilar mass or adenopathy. ?2. Normal appearance of the heart and great vessels. ? ?She has had positive allergy test to cockroach in the past.  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   ? ?She was referred to [place] by ENT on 08/30/2021, discussed follow-up in 2 months and consideration of nasal endoscopy at that visit if no improvement in symptoms.  She was also encouraged to increase her PPI therapy to twice daily for at least 2 months.  She reports that she is only taking pantoprazole at night.  She has not been back to ENT. ? ?She is currently taking azelastine, loratadine, flonase every day.  She has taken montelukast in the past per review of records, but doesn't remember why or when this was stopped.  ? ?She also has a history of asthma.  She uses Dulera 100, 2 puffs twice a day. ?She does get put  on oral steroids quite often.  Thinks she took OCS about 2-3 times last year, usually when she gets sick her asthma will flare.  She used to follow-up with Pulmonary but hasn't been back in a long time. ?Her PCP is now managing her asthma.   ? ?Of note, she has been evaluated by cardiology on 12/06/2021.  EKG was reported as normal sinus rhythm without ischemic changes. ?Planning for coronary CT and echocardiogram due to chest pain thought possibly musculoskeletal in nature as well as heart murmur noted on exam ? ?Echo Impressions on 12/22/21: The LV outflow tract is narrow and there is mild systolic anterior  ?motion of the mitral valve chordae. LVOT flow is turbulent, but without  ?true obstruction at rest. Vallsalva maneuver was not performed. Left  ?ventricular ejection fraction, by  ?estimation, is 70 to 75%. The left ventricle has hyperdynamic function.  ?The left ventricle has no regional wall motion abnormalities. There is  ?moderate concentric left ventricular hypertrophy. Left ventricular  ?diastolic parameters are consistent with  ?Grade I diastolic dysfunction (impaired relaxation).  ?Otherwise normal impression. ?12/20/21: coronary CT: 1. Calcium score 0 2.  Normal ascending thoracic aorta 3.2 cm 3.  CAD RADS 0 no significant CAD Left dominant system ?11/26/21: CT angio: No PE, No acute abnormalities ?Spiro from 2019: normal ratio (91%) and FEV1 (86%) ? ?Other allergy screening: ?Food allergy: no ?Medication allergy: no ?Hymenoptera allergy: no ?Urticaria: no ?Eczema:no ? ? ?Past Medical History: ?Past Medical History:  ?Diagnosis Date  ? Asthma   ?  Chronic cough   ? COVID-19 09/18/2021  ? Fibroids   ? GERD (gastroesophageal reflux disease)   ? Hypertension   ? OAB (overactive bladder)   ? UTI (urinary tract infection)   ? ?Medication List:  ?Current Outpatient Medications  ?Medication Sig Dispense Refill  ? albuterol (PROVENTIL) (2.5 MG/3ML) 0.083% nebulizer solution Take 3 mLs (2.5 mg total) by  nebulization every 6 (six) hours as needed for wheezing or shortness of breath. 75 mL 0  ? albuterol (VENTOLIN HFA) 108 (90 Base) MCG/ACT inhaler Inhale 1 puff into the lungs every 6 (six) hours as needed for wheezing or shortness of breath. 18 g 0  ? amLODipine (NORVASC) 5 MG tablet Take 5 mg by mouth daily.    ? azelastine (ASTELIN) 0.1 % nasal spray Place 1 spray into both nostrils 2 (two) times daily. Use in each nostril as directed (Patient taking differently: Place 1 spray into both nostrils See admin instructions. Instill 1 spray into each nostril two times a day as directed) 30 mL 12  ? diclofenac Sodium (VOLTAREN) 1 % GEL Apply topically 4 (four) times daily.    ? DULERA 100-5 MCG/ACT AERO INHALE 2 PUFFS INTO THE LUNGS TWICE DAILY 13 g 3  ? fluticasone (FLONASE) 50 MCG/ACT nasal spray Place 2 sprays into both nostrils daily.     ? ipratropium (ATROVENT) 0.03 % nasal spray Place 2 sprays into both nostrils 3 (three) times daily. 30 mL 12  ? loratadine (CLARITIN) 10 MG tablet Take 10 mg by mouth daily.    ? montelukast (SINGULAIR) 10 MG tablet Take 1 tablet (10 mg total) by mouth at bedtime. 30 tablet 3  ? Olopatadine HCl 0.2 % SOLN Apply 1 drop to eye daily as needed. 2.5 mL 5  ? oxybutynin (DITROPAN) 5 MG tablet Take 5 mg by mouth 2 (two) times daily.    ? Vitamin D, Ergocalciferol, (DRISDOL) 1.25 MG (50000 UT) CAPS capsule Take 50,000 Units by mouth once a week.    ? pantoprazole (PROTONIX) 40 MG tablet Take 1 tablet (40 mg total) by mouth 2 (two) times daily. 60 tablet 3  ? ?No current facility-administered medications for this visit.  ? ?Known Allergies:  ?No Known Allergies ?Past Surgical History: ?Past Surgical History:  ?Procedure Laterality Date  ? ABDOMINAL HYSTERECTOMY  02/05/2006  ? partial  ? CESAREAN SECTION  1994, 2002, 2005,  ?  x 3  ? MAXILLARY ANTROSTOMY Right 10/07/2018  ? Procedure: RIGHT MAXILLARY ANTROSTOMY WITH TISSUE REMOVAL.;  Surgeon: Melida Quitter, MD;  Location: [facility];  Service: ENT;  Laterality: Right;  ? ?Family History: ?Family History  ?Problem Relation Age of Onset  ? Hypertension Mother   ? Other Father   ?     unsure of medical history  ? Breast cancer Maternal Aunt   ? Allergic rhinitis Neg Hx   ? Angioedema Neg Hx   ? Asthma Neg Hx   ? Atopy Neg Hx   ? Eczema Neg Hx   ? Immunodeficiency Neg Hx   ? Urticaria Neg Hx   ? 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  ? ?ROS:  ?All other systems negative except as noted per HPI. ? ?Objective:  ?  Blood pressure 110/60, pulse 89, temperature 97.7 ?F (36.5 ?C), temperature source Temporal, resp. rate 18, height '5\' 6"'$  (1.676 m), weight 243 lb 12.8 oz (110.6 kg), SpO2 100 %. ?Body mass index is 39.35 kg/m?Marland Kitchen ?Physical Exam: ? ?General Appearance:  Alert, cooperative, no distress, appears stated age, throat clearing throughout exam  ?Head:  Normocephalic, without obvious abnormality, atraumatic  ?Eyes:  Conjunctiva clear, EOM's intact  ?Nose: Nares normal, hypertrophic turbinates, normal mucosa, and no visible anterior polyps  ?Throat: Lips, tongue normal; teeth and gums normal, normal posterior oropharynx, tonsils 2+, and no tonsillar exudate  ?Neck: Supple, symmetrical  ?Lungs:   clear to auscultation bilaterally, Respirations unlabored, no coughing  ?Heart:  regular rate and rhythm and no murmur, Appears well perfused  ?Extremities: No edema  ?Skin: Skin color, texture, turgor normal, no rashes or lesions on visualized portions of skin  ?Neurologic: No gross deficits  ? ? ? ?Diagnostics: ?Spirometry:  ?Tracings reviewed. Her effort: Variable effort-results affected. ?FVC: 1.87L (pre), 1.86L  (post) ?FEV1: 1.81L, 73% predicted (pre), 1.85L, 75% predicted  (post) ?FEV1/FVC ratio: 121% (pre), 124% (post) ?Interpretation: Spirometry consistent with possible restrictive disease with out significant bronchodilator response ? ?Skin Testing: Environmental allergy panel. ? Adequate

## 2021-12-28 ENCOUNTER — Ambulatory Visit (INDEPENDENT_AMBULATORY_CARE_PROVIDER_SITE_OTHER): Payer: Medicaid Other | Admitting: Internal Medicine

## 2021-12-28 ENCOUNTER — Encounter: Payer: Self-pay | Admitting: Internal Medicine

## 2021-12-28 VITALS — BP 110/60 | HR 89 | Temp 97.7°F | Resp 18 | Ht 66.0 in | Wt 243.8 lb

## 2021-12-28 DIAGNOSIS — R053 Chronic cough: Secondary | ICD-10-CM

## 2021-12-28 DIAGNOSIS — J4530 Mild persistent asthma, uncomplicated: Secondary | ICD-10-CM | POA: Diagnosis not present

## 2021-12-28 DIAGNOSIS — H1013 Acute atopic conjunctivitis, bilateral: Secondary | ICD-10-CM

## 2021-12-28 DIAGNOSIS — K219 Gastro-esophageal reflux disease without esophagitis: Secondary | ICD-10-CM | POA: Diagnosis not present

## 2021-12-28 DIAGNOSIS — J310 Chronic rhinitis: Secondary | ICD-10-CM

## 2021-12-28 DIAGNOSIS — J3089 Other allergic rhinitis: Secondary | ICD-10-CM

## 2021-12-28 MED ORDER — PANTOPRAZOLE SODIUM 40 MG PO TBEC: 40.0000 mg | DELAYED_RELEASE_TABLET | Freq: Two times a day (BID) | ORAL | 3 refills | Status: DC

## 2021-12-28 MED ORDER — MONTELUKAST SODIUM 10 MG PO TABS: 10.0000 mg | ORAL_TABLET | Freq: Every day | ORAL | 3 refills | Status: DC

## 2021-12-28 MED ORDER — IPRATROPIUM BROMIDE 0.03 % NA SOLN: 2.0000 | Freq: Three times a day (TID) | NASAL | 12 refills | Status: AC

## 2021-12-28 MED ORDER — OLOPATADINE HCL 0.2 % OP SOLN: 1.0000 [drp] | Freq: Every day | OPHTHALMIC | 5 refills | Status: DC | PRN

## 2021-12-28 NOTE — Patient Instructions (Addendum)
Chronic Rhinitis - perennial (year round) allergies: ?- allergy testing today was positive to indoor/outdoor molds and dust mites, intradermals positive to cockroach ?- allergen avoidance as below ?- consider allergy shots as long term control of your symptoms by teaching your immune system to be more tolerant of your allergy triggers ?- Continue Nasal Steroid Spray: Options include Flonase (fluticasone), Nasocort (triamcinolone), Nasonex (mometasome) 1- 2 sprays in each nostril daily (can buy over-the-counter if not covered by insurance)  Best results if used daily. ?- Continue Astelin (Azelastine) 1-2 sprays in each nostril twice a day as needed.  You may use this as needed for nasal congestion/itchy ears/itchy nose if desired ?- Start Atrovent (Ipratropium Bromide) 1-2 sprays in each nostril up to 3 times a day as needed for runny nose/post nasal drip/drainage.  Use less frequently if airway gets too dry. ?- Start Singulair (Montelukast) '10mg'$  nightly. ?- Continue over the counter antihistamine daily or daily as needed.   ?-Your options include Zyrtec (Cetirizine) '10mg'$ , Claritin (Loratadine) '10mg'$ , Allegra (Fexofenadine) '180mg'$ , or Xyzal (Levocetirinze) '5mg'$  ? ?Allergic Conjunctivitis:  ?- Start Allergy Eye drops: Pataday (Olopatadine)  for eye symptoms daily as needed ?-Avoid eye drops that say red eye relief as they may contain medications that dry out your eyes. ?- Get your yearly eye exam.  ? ?REFLUX:  ?- lifestyle modifications as below ?- Increase pantoprazole to 40 mg in the morning and at night ? ?Mild Persistent Asthma: stable ?- your lung testing today did not show obstruction ?- Controller Inhaler: Continue Dulera 100 2 puffs twice a day; This Should Be Used Everyday ?- Rinse mouth out after use ?- Rescue Inhaler: Albuterol (Proair/Ventolin) 2 puffs . Use  every 4-6 hours as needed for chest tightness, wheezing, or coughing.  Can also use 15 minutes prior to exercise if you have symptoms with activity. ?-  Asthma is not controlled if: ? - Symptoms are occurring >2 times a week OR ? - >2 times a month nighttime awakenings ? - You are requiring systemic steroids (prednisone/steroid injections) more than once per year ? - Your require hospitalization for your asthma. ? - Please call the clinic to schedule a follow up if these symptoms arise ? ?Follow-up in 6 to 8 weeks, sooner if needed.  ?It was a pleasure meeting you in clinic today. ? ?DUST MITE AVOIDANCE MEASURES: ? ?There are three main measures that need and can be taken to avoid house dust mites: ? ?Reduce accumulation of dust in general ?-reduce furniture, clothing, carpeting, books, stuffed animals, especially in bedroom ? ?Separate yourself from the dust ?-use pillow and mattress encasements (can be found at stores such as [employer] or online) ?-avoid direct exposure to air condition flow ?-use a HEPA filter device, especially in the bedroom; you can also use a HEPA filter vacuum cleaner ?-wipe dust with a moist towel instead of a dry towel or broom when cleaning ? ?Decrease mites and/or their secretions ?-wash clothing and linen and stuffed animals at highest temperature possible, at least every 2 weeks ?-stuffed animals can also be placed in a bag and put in a freezer overnight ? ?Despite the above measures, it is impossible to eliminate dust mites or their allergen completely from your home.  With the above measures the burden of mites in your home can be diminished, with the goal of minimizing your allergic symptoms.  Success will be reached only when implementing and using all means together. ? ?Control of Cockroach Allergen ? ?Cockroach  allergen has been identified as an important cause of acute attacks of asthma, especially in urban settings.  There are fifty-five species of cockroach that exist in the [place], however only three, the [place], [place] species produce allergen that can affect patients with Asthma.  Allergens  can be obtained from fecal particles, egg casings and secretions from cockroaches. ?   ?Remove food sources. ?Reduce access to water. ?Seal access and entry points. ?Spray runways with 0.5-1% Diazinon or Chlorpyrifos ?Blow boric acid power under stoves and refrigerator. ?Place bait stations (hydramethylnon) at feeding sites. ? ?Control of Mold Allergen  ? ?Mold and fungi can grow on a variety of surfaces provided certain temperature and moisture conditions exist.  Outdoor molds grow on plants, decaying vegetation and soil.  The major outdoor mold, Alternaria and Cladosporium, are found in very high numbers during hot and dry conditions.  Generally, a late Summer - Fall peak is seen for common outdoor fungal spores.  Rain will temporarily lower outdoor mold spore count, but counts rise rapidly when the rainy period ends.  The most important indoor molds are Aspergillus and Penicillium.  Dark, humid and poorly ventilated basements are ideal sites for mold growth.  The next most common sites of mold growth are the bathroom and the kitchen. ? ?Outdoor (Seasonal) Mold Control ? ?Use air conditioning and keep windows closed ?Avoid exposure to decaying vegetation. ?Avoid leaf raking. ?Avoid grain handling. ?Consider wearing a face mask if working in moldy areas.  ? ? ?Indoor (Perennial) Mold Control  ? ?Maintain humidity below 50%. ?Clean washable surfaces with 5% bleach solution. ?Remove sources e.g. contaminated carpets. ? ? ?CHRONIC THROAT CLEARING-WHAT TO DO!! ?Causes of chronic throat clearing may include the following (among others):  ?Acid reflux (lanyngopharyngeal reflux) ?Allergies (pollens, pet dander, dust mites, etc) ?Non allergic rhinitis (runny nose, post nasal drainage or congestion NOT caused by allergies-can be secondary to pollutants, cold air, changes in blood vessels to the nose as we age,etc) ?Environmental irritants (tobacco, smoke, air pollution) ?Asthma ?If present for a long period of time, throat  clearing can become a habit. ?We will work with you to treat any of the medical reasons for throat clearing.  Your job is to help prevent the habit, which can cause damage (redness and swelling) to your vocal cords.  It will require a conscious effort on your behalf. ? ?Tips for prevention of throat clearing:  ?Instead of clearing your throat, swallow instead.   ?Carrying around water (or something to drink) will help you move the mucus in the right direction.  IF you have the urge to clear your throat, drink your water. ?If you absolutely have to clear your throat, use a non-traumatic exercise to do so.   ?Pant with your mouth open saying ?[place], [place], [place]? with a powerful, breathy voice. ?Increase water intake.  This thins secretions, making them easier to swallow. ?Chew baking soda gum ([employer]) which can help with swallowing, reflux, and throat clearing.  Try to chew up to three times daily.   If you experience jaw pain or headaches, decrease the amount of chewing. ?Suck on sugar free hard candy to help with swallowing. ?Have your friends and family remind you to swallow when they hear you throat clearing.  As this can be habit forming, sometimes you may not realize you are doing this.  Having someone point it out to you, will help you become more conscious of the behavior. ?BE PATIENT.  This  will take time to resolve, and some do not see improvement until 8-12 weeks into therapy/behavior modifications. ? ?

## 2022-01-05 ENCOUNTER — Other Ambulatory Visit: Payer: Self-pay | Admitting: Internal Medicine

## 2022-01-06 LAB — CBC
HCT: 39.5 % (ref 35.0–45.0)
Hemoglobin: 12.6 g/dL (ref 11.7–15.5)
MCH: 25.4 pg — ABNORMAL LOW (ref 27.0–33.0)
MCHC: 31.9 g/dL — ABNORMAL LOW (ref 32.0–36.0)
MCV: 79.6 fL — ABNORMAL LOW (ref 80.0–100.0)
MPV: 10.5 fL (ref 7.5–12.5)
Platelets: 287 10*3/uL (ref 140–400)
RBC: 4.96 10*6/uL (ref 3.80–5.10)
RDW: 12.7 % (ref 11.0–15.0)
WBC: 8.7 10*3/uL (ref 3.8–10.8)

## 2022-01-06 LAB — COMPLETE METABOLIC PANEL WITH GFR
AG Ratio: 1.4 (calc) (ref 1.0–2.5)
ALT: 17 U/L (ref 6–29)
AST: 27 U/L (ref 10–35)
Albumin: 4.1 g/dL (ref 3.6–5.1)
Alkaline phosphatase (APISO): 114 U/L (ref 37–153)
BUN: 15 mg/dL (ref 7–25)
CO2: 25 mmol/L (ref 20–32)
Calcium: 9.6 mg/dL (ref 8.6–10.4)
Chloride: 105 mmol/L (ref 98–110)
Creat: 1.01 mg/dL (ref 0.50–1.03)
Globulin: 3 g/dL (calc) (ref 1.9–3.7)
Glucose, Bld: 79 mg/dL (ref 65–99)
Potassium: 4.5 mmol/L (ref 3.5–5.3)
Sodium: 139 mmol/L (ref 135–146)
Total Bilirubin: 0.3 mg/dL (ref 0.2–1.2)
Total Protein: 7.1 g/dL (ref 6.1–8.1)
eGFR: 67 mL/min/{1.73_m2} (ref >=60)

## 2022-01-06 LAB — URINE CULTURE
MICRO NUMBER:: 13231567
Result:: NO GROWTH
SPECIMEN QUALITY:: ADEQUATE

## 2022-01-06 LAB — LIPID PANEL
Cholesterol: 161 mg/dL (ref <200)
HDL: 39 mg/dL — ABNORMAL LOW (ref >=50)
LDL Cholesterol (Calc): 101 mg/dL (calc) — ABNORMAL HIGH
Non-HDL Cholesterol (Calc): 122 mg/dL (calc) (ref <130)
Total CHOL/HDL Ratio: 4.1 (calc) (ref <5.0)
Triglycerides: 115 mg/dL (ref <150)

## 2022-01-06 LAB — TSH: TSH: 1.26 mIU/L

## 2022-01-11 ENCOUNTER — Other Ambulatory Visit: Payer: Self-pay | Admitting: Internal Medicine

## 2022-01-11 ENCOUNTER — Ambulatory Visit
Admission: RE | Admit: 2022-01-11 | Discharge: 2022-01-11 | Disposition: A | Discharge status: Home / Self Care | Payer: Medicaid Other | Source: Ambulatory Visit | Attending: Internal Medicine | Admitting: Internal Medicine

## 2022-01-11 DIAGNOSIS — Z1231 Encounter for screening mammogram for malignant neoplasm of breast: Secondary | ICD-10-CM

## 2022-02-06 DIAGNOSIS — M25551 Pain in right hip: Secondary | ICD-10-CM | POA: Insufficient documentation

## 2022-02-21 NOTE — Progress Notes (Unsigned)
FOLLOW UP Date of Service/Encounter:  02/22/22   Subjective:  Joyce Welch (DOB: 24-Jan-1971) is a 51 y.o. female who returns to the [facility] on 02/22/2022 in re-evaluation of the following: asthma, allergic rhinitis and conjunctivitis, GERD History obtained from: chart review and patient.  For Review, LV was on 12/28/21  with Dr.Fumie Fiallo seen for initial visit for chronic cough.  Cough suspected secondary to upper airway inflammation.  Discussed increasing her pantoprazole to 40 mg TWICE daily, added atrovent and singulair to her current allergy regiment (AH, INCS, astelin), recommended AIT if no improvement with these changes.   Pertinent History/Diagnostics:  - Cough:  starts following URI leading to PND then cough,   - sinus CT: 07/19/18: Moderate right maxillary sinus mucosal thickening  - Chest CT: 07/29/18: 1. Unremarkable CT examination the chest. No pulmonary abnormalities and no mediastinal or hilar mass or adenopathy. 2. Normal appearance of the heart and great vessels  - hx of sinus surgery Jan 2020 (right maxillary antrostomy with removal of fungal ball), cough improved after surgery but returned within one month  - cardiology visit on 12/06/2021.  EKG was reported as normal sinus rhythm without ischemic changes.  - Echo I on 12/22/21: The LV outflow tract is narrow and there is mild systolic anterior motion of the mitral valve chordae. LVOT flow is turbulent, but without true obstruction at rest. Vallsalva maneuver was not performed. Left ventricular ejection fraction, by estimation, is 70 to 75%. The left ventricle has hyperdynamic function. The left ventricle has no regional wall motion abnormalities. There is moderate concentric left ventricular hypertrophy. Left ventricular diastolic parameters are consistent with Grade I diastolic dysfunction (impaired relaxation). Otherwise normal impression. - 12/20/21: coronary CT: 1. Calcium score 0 2.  Normal  ascending thoracic aorta 3.2 cm 3.  CAD RADS 0 no significant CAD Left dominant system - 11/26/21: CT angio: No PE, No acute abnormalities - Spiro from 2019: normal ratio (91%) and FEV1 (86%)  - spirometry (12/28/21): ratio 121%, 73% FEV1 (pre),  75% FEV1 (post)-showed restriction without bronchodilator response - Allergic Rhinitis: taking azelastine, loratadine, flonase every day.  She has taken montelukast in the past per review of records, but doesn't remember why or when this was stopped.   - SPT environmental panel (12/28/21): + dreschlera, epicoccum, dust mites, IDs + cockroach   Today presents for follow-up. Asthma: well-controlled, has not required her rescue inhaler, no antibiotics or steroids for respiratory illness since last visit Cough has improved since last visit. Now only coughs if eats food that are too cold like ice cream Allergic rhinitis or conjunctivitis: She continues to use Claritin and a combination of nasal sprays.  Also using Pataday eyedrops.  Her eye symptoms are controlled, but she continues to have phlegm in her throat that she is coughing up constantly.  She was taking Singulair, but is using an old prescription which ran out.  She needs this refilled.  We did discuss allergy injections, but at this point in time she would like to try medical management so that she is not having to make as many trips to our office.  Her youngest child will be going to college soon and she is looking forward to not having to travel as much. She also states that she got a recall from her pharmacist about Flonase.  I cannot find any recent information about a Flonase recall, but we did discuss switching her to Nasacort as a second option.  She is going  to send [place] the paperwork about flonase recall so that we can look into this further. Reflux: she is having gastritis, she is going for a colonoscopy because has a history of multiple colon.  Allergies as of 02/22/2022   No Known Allergies       Medication List        Accurate as of Feb 22, 2022  4:42 PM. If you have any questions, ask your nurse or doctor.          STOP taking these medications    fluticasone 50 MCG/ACT nasal spray Commonly known as: FLONASE Stopped by: Sigurd Sos, MD   loratadine 10 MG tablet Commonly known as: CLARITIN Stopped by: Sigurd Sos, MD       TAKE these medications    albuterol (2.5 MG/3ML) 0.083% nebulizer solution Commonly known as: PROVENTIL Take 3 mLs (2.5 mg total) by nebulization every 6 (six) hours as needed for wheezing or shortness of breath.   albuterol 108 (90 Base) MCG/ACT inhaler Commonly known as: VENTOLIN HFA Inhale 1 puff into the lungs every 6 (six) hours as needed for wheezing or shortness of breath.   amLODipine 5 MG tablet Commonly known as: NORVASC Take 5 mg by mouth daily.   azelastine 0.1 % nasal spray Commonly known as: ASTELIN Place 1 spray into both nostrils 2 (two) times daily. Use in each nostril as directed What changed:  when to take this additional instructions   diclofenac Sodium 1 % Gel Commonly known as: VOLTAREN Apply topically 4 (four) times daily.   Dulera 100-5 MCG/ACT Aero Generic drug: mometasone-formoterol INHALE 2 PUFFS INTO THE LUNGS TWICE DAILY   ipratropium 0.03 % nasal spray Commonly known as: ATROVENT Place 2 sprays into both nostrils 3 (three) times daily.   levocetirizine 5 MG tablet Commonly known as: XYZAL Take 1 tablet (5 mg total) by mouth every evening. Started by: Sigurd Sos, MD   montelukast 10 MG tablet Commonly known as: Singulair Take 1 tablet (10 mg total) by mouth at bedtime.   Olopatadine HCl 0.2 % Soln Apply 1 drop to eye daily as needed.   oxybutynin 5 MG tablet Commonly known as: DITROPAN Take 5 mg by mouth 2 (two) times daily.   pantoprazole 40 MG tablet Commonly known as: PROTONIX Take 1 tablet (40 mg total) by mouth 2 (two) times daily.   triamcinolone 55 MCG/ACT Aero nasal  inhaler Commonly known as: NASACORT Place 2 sprays into the nose daily. Started by: Sigurd Sos, MD   Vitamin D (Ergocalciferol) 1.25 MG (50000 UNIT) Caps capsule Commonly known as: DRISDOL Take 50,000 Units by mouth once a week.       Past Medical History:  Diagnosis Date   Asthma    Chronic cough    COVID-19 09/18/2021   Fibroids    GERD (gastroesophageal reflux disease)    Hypertension    OAB (overactive bladder)    UTI (urinary tract infection)    Past Surgical History:  Procedure Laterality Date   ABDOMINAL HYSTERECTOMY  02/05/2006   partial   CESAREAN SECTION  1994, 2002, 2005,    x 3   MAXILLARY ANTROSTOMY Right 10/07/2018   Procedure: RIGHT MAXILLARY ANTROSTOMY WITH TISSUE REMOVAL.;  Surgeon: Melida Quitter, MD;  Location: [facility];  Service: ENT;  Laterality: Right;   Otherwise, there have been no changes to her past medical history, surgical history, family history, or social history.  ROS: All others negative except as noted per HPI.  Objective:  BP 138/86   Pulse 84   Temp 97.8 F (36.6 C)   Resp 16   Ht '5\' 6"'$  (1.676 m)   Wt 240 lb (108.9 kg)   SpO2 99%   BMI 38.74 kg/m  Body mass index is 38.74 kg/m. Physical Exam:  General Appearance:  Alert, cooperative, no distress, appears stated age  Head:  Normocephalic, without obvious abnormality, atraumatic  Eyes:  Conjunctiva clear, EOM's intact  Nose: Nares normal  Throat: Lips, tongue normal; teeth and gums normal  Neck: Supple, symmetrical  Lungs:   Respirations unlabored, no coughing  Heart:  Appears well perfused  Extremities: No edema  Skin: Skin color, texture, turgor normal, no rashes or lesions on visualized portions of skin  Neurologic: No gross deficits    Spirometry:  Tracings reviewed. Her effort: Good reproducible efforts. FVC: 1.90L FEV1: 1.71L, 69% predicted FEV1/FVC ratio: 113% Interpretation: Spirometry consistent with possible restrictive disease. Suspected  related to body habitus. Please see scanned spirometry results for details.   Assessment/Plan  Chelsie continues to have some upper airway symptoms.  We reviewed use of nasal sprays, switched her to a more potent antihistamine and discussed LPR and its effects on upper airway inflammation. We will increase her protonix to BID dosing. Her asthma remains controlled.  Perennial (year round) allergies: not controlled - allergen avoidance toward indoor/outdoor molds and dust mites, cockroach - consider allergy shots as long term control of your symptoms by teaching your immune system to be more tolerant of your allergy triggers - Start Nasonex (mometasome) 1- 2 sprays in each nostril daily  Best results if used daily. - Continue Astelin (Azelastine) 1-2 sprays in each nostril twice a day as needed.   - Continue Atrovent (Ipratropium Bromide) 1-2 sprays in each nostril up to 3 times a day as needed for runny nose/post nasal drip/drainage.  Use less frequently if airway gets too dry. - Continue Singulair (Montelukast) '10mg'$  nightly. - Start Xyzal (Levocetirinze) '5mg'$  daily  Allergic Conjunctivitis: controlled - Continue Allergy Eye drops: Pataday (Olopatadine)  for eye symptoms daily as needed -Avoid eye drops that say red eye relief as they may contain medications that dry out your eyes. - Get your yearly eye exam.   REFLUX: not controlled - lifestyle modifications as below - increase pantoprazole 40 mg in the morning and at night - follow up with GI as scheduled  Mild Persistent Asthma: stable - your lung testing today looked similar to last visit - Controller Inhaler: Continue Dulera 100 2 puffs twice a day; This Should Be Used Everyday - Rinse mouth out after use - Rescue Inhaler: Albuterol (Proair/Ventolin) 2 puffs . Use  every 4-6 hours as needed for chest tightness, wheezing, or coughing.  Can also use 15 minutes prior to exercise if you have symptoms with activity. - Asthma is not  controlled if:  - Symptoms are occurring >2 times a week OR  - >2 times a month nighttime awakenings  - You are requiring systemic steroids (prednisone/steroid injections) more than once per year  - Your require hospitalization for your asthma.  - Please call the clinic to schedule a follow up if these symptoms arise  Follow-up in 12 weeks, sooner if needed.  It was a pleasure seeing you again in clinic today.  Sigurd Sos, MD  [facility]

## 2022-02-22 ENCOUNTER — Ambulatory Visit (INDEPENDENT_AMBULATORY_CARE_PROVIDER_SITE_OTHER): Payer: Medicaid Other | Admitting: Internal Medicine

## 2022-02-22 ENCOUNTER — Encounter: Payer: Self-pay | Admitting: Internal Medicine

## 2022-02-22 VITALS — BP 138/86 | HR 84 | Temp 97.8°F | Resp 16 | Ht 66.0 in | Wt 240.0 lb

## 2022-02-22 DIAGNOSIS — J4530 Mild persistent asthma, uncomplicated: Secondary | ICD-10-CM

## 2022-02-22 DIAGNOSIS — R053 Chronic cough: Secondary | ICD-10-CM

## 2022-02-22 DIAGNOSIS — H1013 Acute atopic conjunctivitis, bilateral: Secondary | ICD-10-CM | POA: Insufficient documentation

## 2022-02-22 DIAGNOSIS — K219 Gastro-esophageal reflux disease without esophagitis: Secondary | ICD-10-CM | POA: Diagnosis not present

## 2022-02-22 DIAGNOSIS — J3089 Other allergic rhinitis: Secondary | ICD-10-CM

## 2022-02-22 MED ORDER — LEVOCETIRIZINE DIHYDROCHLORIDE 5 MG PO TABS: 5.0000 mg | ORAL_TABLET | Freq: Every evening | ORAL | 3 refills | Status: DC

## 2022-02-22 MED ORDER — PANTOPRAZOLE SODIUM 40 MG PO TBEC: 40.0000 mg | DELAYED_RELEASE_TABLET | Freq: Two times a day (BID) | ORAL | 3 refills | Status: AC

## 2022-02-22 MED ORDER — MONTELUKAST SODIUM 10 MG PO TABS: 10.0000 mg | ORAL_TABLET | Freq: Every day | ORAL | 3 refills | Status: DC

## 2022-02-22 MED ORDER — TRIAMCINOLONE ACETONIDE 55 MCG/ACT NA AERO
2.0000 | INHALATION_SPRAY | Freq: Every day | NASAL | 12 refills | Status: DC
Start: 2022-02-22 — End: 2022-05-31

## 2022-02-22 NOTE — Patient Instructions (Addendum)
Perennial (year round) allergies: - allergen avoidance toward indoor/outdoor molds and dust mites, cockroach - consider allergy shots as long term control of your symptoms by teaching your immune system to be more tolerant of your allergy triggers - Start Nasonex (mometasome) 1- 2 sprays in each nostril daily  Best results if used daily. - Continue Astelin (Azelastine) 1-2 sprays in each nostril twice a day as needed.   - Continue Atrovent (Ipratropium Bromide) 1-2 sprays in each nostril up to 3 times a day as needed for runny nose/post nasal drip/drainage.  Use less frequently if airway gets too dry. - Continue Singulair (Montelukast) '10mg'$  nightly. - Start Xyzal (Levocetirinze) '5mg'$  daily  Allergic Conjunctivitis:  - Continue Allergy Eye drops: Pataday (Olopatadine)  for eye symptoms daily as needed -Avoid eye drops that say red eye relief as they may contain medications that dry out your eyes. - Get your yearly eye exam.   REFLUX:  - lifestyle modifications as below - increase pantoprazole 40 mg in the morning and at night - follow up with GI as scheduled  Mild Persistent Asthma: stable - your lung testing today looked similar to last visit - Controller Inhaler: Continue Dulera 100 2 puffs twice a day; This Should Be Used Everyday - Rinse mouth out after use - Rescue Inhaler: Albuterol (Proair/Ventolin) 2 puffs . Use  every 4-6 hours as needed for chest tightness, wheezing, or coughing.  Can also use 15 minutes prior to exercise if you have symptoms with activity. - Asthma is not controlled if:  - Symptoms are occurring >2 times a week OR  - >2 times a month nighttime awakenings  - You are requiring systemic steroids (prednisone/steroid injections) more than once per year  - Your require hospitalization for your asthma.  - Please call the clinic to schedule a follow up if these symptoms arise  Follow-up in 12 weeks, sooner if needed.  It was a pleasure seeing you again in clinic  today. --------------------------------------------------------------------------------------  DUST MITE AVOIDANCE MEASURES:  There are three main measures that need and can be taken to avoid house dust mites:  Reduce accumulation of dust in general -reduce furniture, clothing, carpeting, books, stuffed animals, especially in bedroom  Separate yourself from the dust -use pillow and mattress encasements (can be found at stores such as [employer] or online) -avoid direct exposure to air condition flow -use a HEPA filter device, especially in the bedroom; you can also use a HEPA filter vacuum cleaner -wipe dust with a moist towel instead of a dry towel or broom when cleaning  Decrease mites and/or their secretions -wash clothing and linen and stuffed animals at highest temperature possible, at least every 2 weeks -stuffed animals can also be placed in a bag and put in a freezer overnight  Despite the above measures, it is impossible to eliminate dust mites or their allergen completely from your home.  With the above measures the burden of mites in your home can be diminished, with the goal of minimizing your allergic symptoms.  Success will be reached only when implementing and using all means together.  Control of Cockroach Allergen  Cockroach allergen has been identified as an important cause of acute attacks of asthma, especially in urban settings.  There are fifty-five species of cockroach that exist in the [place], however only three, the [place], [place] species produce allergen that can affect patients with Asthma.  Allergens can be obtained from fecal particles, egg casings and secretions  from cockroaches.    Remove food sources. Reduce access to water. Seal access and entry points. Spray runways with 0.5-1% Diazinon or Chlorpyrifos Blow boric acid power under stoves and refrigerator. Place bait stations (hydramethylnon) at feeding sites.  Control  of Mold Allergen   Mold and fungi can grow on a variety of surfaces provided certain temperature and moisture conditions exist.  Outdoor molds grow on plants, decaying vegetation and soil.  The major outdoor mold, Alternaria and Cladosporium, are found in very high numbers during hot and dry conditions.  Generally, a late Summer - Fall peak is seen for common outdoor fungal spores.  Rain will temporarily lower outdoor mold spore count, but counts rise rapidly when the rainy period ends.  The most important indoor molds are Aspergillus and Penicillium.  Dark, humid and poorly ventilated basements are ideal sites for mold growth.  The next most common sites of mold growth are the bathroom and the kitchen.  Outdoor (Seasonal) Mold Control  Use air conditioning and keep windows closed Avoid exposure to decaying vegetation. Avoid leaf raking. Avoid grain handling. Consider wearing a face mask if working in moldy areas.    Indoor (Perennial) Mold Control   Maintain humidity below 50%. Clean washable surfaces with 5% bleach solution. Remove sources e.g. contaminated carpets.   CHRONIC THROAT CLEARING-WHAT TO DO!! Causes of chronic throat clearing may include the following (among others):  Acid reflux (lanyngopharyngeal reflux) Allergies (pollens, pet dander, dust mites, etc) Non allergic rhinitis (runny nose, post nasal drainage or congestion NOT caused by allergies-can be secondary to pollutants, cold air, changes in blood vessels to the nose as we age,etc) Environmental irritants (tobacco, smoke, air pollution) Asthma If present for a long period of time, throat clearing can become a habit. We will work with you to treat any of the medical reasons for throat clearing.  Your job is to help prevent the habit, which can cause damage (redness and swelling) to your vocal cords.  It will require a conscious effort on your behalf.  Tips for prevention of throat clearing:  Instead of clearing your  throat, swallow instead.   Carrying around water (or something to drink) will help you move the mucus in the right direction.  IF you have the urge to clear your throat, drink your water. If you absolutely have to clear your throat, use a non-traumatic exercise to do so.   Pant with your mouth open saying "[place], [place], [place]" with a powerful, breathy voice. Increase water intake.  This thins secretions, making them easier to swallow. Chew baking soda gum ([employer]) which can help with swallowing, reflux, and throat clearing.  Try to chew up to three times daily.   If you experience jaw pain or headaches, decrease the amount of chewing. Suck on sugar free hard candy to help with swallowing. Have your friends and family remind you to swallow when they hear you throat clearing.  As this can be habit forming, sometimes you may not realize you are doing this.  Having someone point it out to you, will help you become more conscious of the behavior. BE PATIENT.  This will take time to resolve, and some do not see improvement until 8-12 weeks into therapy/behavior modifications.

## 2022-03-07 ENCOUNTER — Ambulatory Visit: Payer: Medicaid Other

## 2022-03-07 ENCOUNTER — Other Ambulatory Visit: Payer: Self-pay | Admitting: Internal Medicine

## 2022-03-08 LAB — URINE CULTURE
MICRO NUMBER:: 13489444
Result:: NO GROWTH
SPECIMEN QUALITY:: ADEQUATE

## 2022-03-31 ENCOUNTER — Ambulatory Visit
Admission: EM | Admit: 2022-03-31 | Discharge: 2022-03-31 | Disposition: A | Discharge status: Home / Self Care | Payer: Medicaid Other | Attending: Internal Medicine | Admitting: Internal Medicine

## 2022-03-31 DIAGNOSIS — R0602 Shortness of breath: Secondary | ICD-10-CM

## 2022-03-31 DIAGNOSIS — J4521 Mild intermittent asthma with (acute) exacerbation: Secondary | ICD-10-CM | POA: Diagnosis not present

## 2022-03-31 MED ORDER — ALBUTEROL SULFATE (2.5 MG/3ML) 0.083% IN NEBU: 2.5000 mg | INHALATION_SOLUTION | Freq: Four times a day (QID) | RESPIRATORY_TRACT | 0 refills | Status: AC | PRN

## 2022-03-31 MED ORDER — PREDNISONE 20 MG PO TABS: 40.0000 mg | ORAL_TABLET | Freq: Every day | ORAL | 0 refills | Status: AC

## 2022-03-31 NOTE — ED Triage Notes (Signed)
Pt c/o SOB not relieved well w/ inhaler. Associated cough. Onset ~ 2 days ago. Last used inhaler "this morning."

## 2022-03-31 NOTE — Discharge Instructions (Signed)
It appears that you are having an asthma flareup.  This is being treated with prednisone to decrease inflammation.  Your albuterol nebulizer solution has also been refilled for you to take as needed.

## 2022-03-31 NOTE — ED Provider Notes (Signed)
[name]    CSN: [id number] Arrival date & time: 03/31/22  1131      History   Chief Complaint Chief Complaint  Patient presents with   Shortness of Breath    HPI Joyce Welch is a 51 y.o. female.   Patient presents with 2-day history of shortness of breath and cough.  Denies any associated upper respiratory symptoms, fever, sore throat, ear pain, nausea, vomiting, diarrhea, abdominal pain.  Denies any known sick contacts.  Patient does have a history of asthma and is concerned that her asthma is flaring up.  She used her albuterol inhaler with minimal improvement.  She is also requesting a refill on albuterol nebulizer solution as she is about to run out of this as well.   Shortness of Breath   Past Medical History:  Diagnosis Date   Asthma    Chronic cough    COVID-19 09/18/2021   Fibroids    GERD (gastroesophageal reflux disease)    Hypertension    OAB (overactive bladder)    UTI (urinary tract infection)     Patient Active Problem List   Diagnosis Date Noted   Allergic conjunctivitis of both eyes 02/22/2022   Perennial allergic rhinitis 02/22/2022   Gastroesophageal reflux disease 02/22/2022   Mild persistent asthma without complication 81/44/8185   Chest pain of uncertain etiology 63/14/9702   Heart murmur 12/06/2021   Paresthesia 10/17/2018   Weakness 10/17/2018   Cough 06/20/2018   Post-nasal drip 06/20/2018   Globus sensation 06/20/2018   Asthma exacerbation 10/31/2017   Leukocytosis 10/31/2017   Strain of quadriceps tendon 02/14/2014   Symptomatic menopausal or female climacteric states 07/31/2013    Past Surgical History:  Procedure Laterality Date   ABDOMINAL HYSTERECTOMY  02/05/2006   partial   [place], 2002, 2005,    x 3   MAXILLARY ANTROSTOMY Right 10/07/2018   Procedure: RIGHT MAXILLARY ANTROSTOMY WITH TISSUE REMOVAL.;  Surgeon: Melida Quitter, MD;  Location: [facility];  Service: ENT;   Laterality: Right;    OB History     Gravida  3   Para  3   Term  1   Preterm      AB      Living  3      SAB      IAB      Ectopic      Multiple      Live Births  3            Home Medications    Prior to Admission medications   Medication Sig Start Date End Date Taking? Authorizing Provider  albuterol (PROVENTIL) (2.5 MG/3ML) 0.083% nebulizer solution Take 3 mLs (2.5 mg total) by nebulization every 6 (six) hours as needed for wheezing or shortness of breath. 03/31/22  Yes , Hildred Alamin E, FNP  predniSONE (DELTASONE) 20 MG tablet Take 2 tablets (40 mg total) by mouth daily for 5 days. 03/31/22 04/05/22 Yes , Michele Rockers, FNP  albuterol (PROVENTIL) (2.5 MG/3ML) 0.083% nebulizer solution Take 3 mLs (2.5 mg total) by nebulization every 6 (six) hours as needed for wheezing or shortness of breath. 10/28/20   Jaynee Eagles, PA-C  albuterol (VENTOLIN HFA) 108 (90 Base) MCG/ACT inhaler Inhale 1 puff into the lungs every 6 (six) hours as needed for wheezing or shortness of breath. 10/28/20   Jaynee Eagles, PA-C  amLODipine (NORVASC) 5 MG tablet Take 5 mg by mouth daily.    [provider]  azelastine (ASTELIN) 0.1 % nasal spray Place 1 spray into both nostrils 2 (two) times daily. Use in each nostril as directed Patient taking differently: Place 1 spray into both nostrils See admin instructions. Instill 1 spray into each nostril two times a day as directed 06/04/18   [name], MD  diclofenac Sodium (VOLTAREN) 1 % GEL Apply topically 4 (four) times daily.    [provider]  DULERA 100-5 MCG/ACT AERO INHALE 2 PUFFS INTO THE LUNGS TWICE DAILY 11/15/18   Fenton Foy, NP  ipratropium (ATROVENT) 0.03 % nasal spray Place 2 sprays into both nostrils 3 (three) times daily. 12/28/21   Clemon Chambers, MD  levocetirizine (XYZAL) 5 MG tablet Take 1 tablet (5 mg total) by mouth every evening. 02/22/22   Clemon Chambers, MD  montelukast (SINGULAIR) 10 MG tablet Take 1 tablet  (10 mg total) by mouth at bedtime. 02/22/22   Clemon Chambers, MD  Olopatadine HCl 0.2 % SOLN Apply 1 drop to eye daily as needed. 12/28/21   Clemon Chambers, MD  oxybutynin (DITROPAN) 5 MG tablet Take 5 mg by mouth 2 (two) times daily. 11/07/21   [provider]  pantoprazole (PROTONIX) 40 MG tablet Take 1 tablet (40 mg total) by mouth 2 (two) times daily. 02/22/22   Clemon Chambers, MD  triamcinolone (NASACORT) 55 MCG/ACT AERO nasal inhaler Place 2 sprays into the nose daily. 02/22/22   Clemon Chambers, MD  Vitamin D, Ergocalciferol, (DRISDOL) 1.25 MG (50000 UT) CAPS capsule Take 50,000 Units by mouth once a week. 05/16/19   [provider]    Family History Family History  Problem Relation Age of Onset   Hypertension Mother    Other Father        unsure of medical history   Breast cancer Maternal Aunt    Allergic rhinitis Neg Hx    Angioedema Neg Hx    Asthma Neg Hx    Atopy Neg Hx    Eczema Neg Hx    Immunodeficiency Neg Hx    Urticaria Neg Hx     Social History Social History   Tobacco Use   Smoking status: Never    Passive exposure: Never   Smokeless tobacco: Never  Vaping Use   Vaping Use: Never used  Substance Use Topics   Alcohol use: Never    Comment: socially   Drug use: Never     Allergies   Patient has no known allergies.   Review of Systems Review of Systems Per HPI  Physical Exam Triage Vital Signs ED Triage Vitals [03/31/22 1152]  Enc Vitals Group     BP 120/80     Pulse Rate 90     Resp 18     Temp 98 F (36.7 C)     Temp Source Oral     SpO2 96 %     Weight      Height      Head Circumference      Peak Flow      Pain Score 0     Pain Loc      Pain Edu?      Excl. in [place]?    No data found.  Updated Vital Signs BP 120/80 (BP Location: Left Arm)   Pulse 90   Temp 98 F (36.7 C) (Oral)   Resp 18   SpO2 96%   Visual Acuity Right Eye Distance:   Left Eye Distance:   Bilateral Distance:  Right Eye Near:   Left Eye  Near:    Bilateral Near:     Physical Exam Constitutional:      General: She is not in acute distress.    Appearance: Normal appearance. She is not toxic-appearing or diaphoretic.  HENT:     Head: Normocephalic and atraumatic.     Right Ear: Tympanic membrane and ear canal normal.     Left Ear: Tympanic membrane and ear canal normal.     Nose: Nose normal.     Mouth/Throat:     Mouth: Mucous membranes are moist.     Pharynx: No posterior oropharyngeal erythema.  Eyes:     Extraocular Movements: Extraocular movements intact.     Conjunctiva/sclera: Conjunctivae normal.  Cardiovascular:     Rate and Rhythm: Normal rate and regular rhythm.     Pulses: Normal pulses.     Heart sounds: Normal heart sounds.  Pulmonary:     Effort: Pulmonary effort is normal. No respiratory distress.     Breath sounds: No stridor. Wheezing present. No rhonchi or rales.     Comments: Mild wheezing noted on exam.  Neurological:     General: No focal deficit present.     Mental Status: She is alert and oriented to person, place, and time. Mental status is at baseline.  Psychiatric:        Mood and Affect: Mood normal.        Behavior: Behavior normal.        Thought Content: Thought content normal.        Judgment: Judgment normal.      UC Treatments / Results  Labs (all labs ordered are listed, but only abnormal results are displayed) Labs Reviewed - No data to display  EKG   Radiology No results found.  Procedures Procedures (including critical care time)  Medications Ordered in UC Medications - No data to display  Initial Impression / Assessment and Plan / UC Course  I have reviewed the triage vital signs and the nursing notes.  Pertinent labs & imaging results that were available during my care of the patient were reviewed by me and considered in my medical decision making (see chart for details).     Physical exam and patient's symptoms are consistent with asthma exacerbation  that is mild.  Will refill albuterol nebulizer solution per patient request.  I also think the patient benefit from a course of prednisone steroid.  Patient is not any respiratory distress, vitals are stable, no tachypnea present so do not think that emergent evaluation at the hospital is necessary.  Patient advised to follow-up if symptoms persist or worsen.  Patient verbalized understanding and was agreeable with plan. Final Clinical Impressions(s) / UC Diagnoses   Final diagnoses:  Mild intermittent asthma with acute exacerbation  Shortness of breath     Discharge Instructions      It appears that you are having an asthma flareup.  This is being treated with prednisone to decrease inflammation.  Your albuterol nebulizer solution has also been refilled for you to take as needed.    ED Prescriptions     Medication Sig Dispense Auth. Provider   predniSONE (DELTASONE) 20 MG tablet Take 2 tablets (40 mg total) by mouth daily for 5 days. 10 tablet [place], Hildred Alamin E, Marty   albuterol (PROVENTIL) (2.5 MG/3ML) 0.083% nebulizer solution Take 3 mLs (2.5 mg total) by nebulization every 6 (six) hours as needed for wheezing or shortness of breath. 75 mL  Teodora Medici, [place]      PDMP not reviewed this encounter.   Teodora Medici, [place] 03/31/22 1304

## 2022-04-21 ENCOUNTER — Ambulatory Visit: Payer: Self-pay

## 2022-05-05 ENCOUNTER — Ambulatory Visit
Admission: EM | Admit: 2022-05-05 | Discharge: 2022-05-05 | Disposition: A | Discharge status: Home / Self Care | Payer: Medicaid Other | Attending: Physician Assistant | Admitting: Physician Assistant

## 2022-05-05 DIAGNOSIS — J45901 Unspecified asthma with (acute) exacerbation: Secondary | ICD-10-CM

## 2022-05-05 MED ORDER — PREDNISONE 20 MG PO TABS: 40.0000 mg | ORAL_TABLET | Freq: Every day | ORAL | 0 refills | Status: AC

## 2022-05-05 NOTE — ED Triage Notes (Signed)
Pt here for asthma exacerbation

## 2022-05-05 NOTE — ED Provider Notes (Signed)
[name]    CSN: [id number] Arrival date & time: 05/05/22  1926      History   Chief Complaint Chief Complaint  Patient presents with   Asthma    HPI Joyce Welch is a 51 y.o. female.   Patient here today for evaluation of possible asthma exacerbation.  She reports that she has been having more shortness of breath and chest tightness as well as mild cough.  She reports that she was to have a follow-up with her PCP after last urgent care visit however she did not want a televisit as she prefers in person visits.  She reports that her current symptoms have been ongoing for the last few days.  She does have some discomfort to her left thoracic area with deep breathing.  She has not had fever.  She has used her albuterol inhaler without significant relief.  The history is provided by the patient.    Past Medical History:  Diagnosis Date   Asthma    Chronic cough    COVID-19 09/18/2021   Fibroids    GERD (gastroesophageal reflux disease)    Hypertension    OAB (overactive bladder)    UTI (urinary tract infection)     Patient Active Problem List   Diagnosis Date Noted   Allergic conjunctivitis of both eyes 02/22/2022   Perennial allergic rhinitis 02/22/2022   Gastroesophageal reflux disease 02/22/2022   Mild persistent asthma without complication 16/04/3709   Chest pain of uncertain etiology 62/69/4854   Heart murmur 12/06/2021   Paresthesia 10/17/2018   Weakness 10/17/2018   Cough 06/20/2018   Post-nasal drip 06/20/2018   Globus sensation 06/20/2018   Asthma exacerbation 10/31/2017   Leukocytosis 10/31/2017   Strain of quadriceps tendon 02/14/2014   Symptomatic menopausal or female climacteric states 07/31/2013    Past Surgical History:  Procedure Laterality Date   ABDOMINAL HYSTERECTOMY  02/05/2006   partial   [place], 2002, 2005,    x 3   MAXILLARY ANTROSTOMY Right 10/07/2018   Procedure: RIGHT MAXILLARY ANTROSTOMY WITH  TISSUE REMOVAL.;  Surgeon: Melida Quitter, MD;  Location: [facility];  Service: ENT;  Laterality: Right;    OB History     Gravida  3   Para  3   Term  1   Preterm      AB      Living  3      SAB      IAB      Ectopic      Multiple      Live Births  3            Home Medications    Prior to Admission medications   Medication Sig Start Date End Date Taking? Authorizing Provider  predniSONE (DELTASONE) 20 MG tablet Take 2 tablets (40 mg total) by mouth daily with breakfast for 5 days. 05/05/22 05/10/22 Yes Francene Finders, PA-C  albuterol (PROVENTIL) (2.5 MG/3ML) 0.083% nebulizer solution Take 3 mLs (2.5 mg total) by nebulization every 6 (six) hours as needed for wheezing or shortness of breath. 10/28/20   Jaynee Eagles, PA-C  albuterol (PROVENTIL) (2.5 MG/3ML) 0.083% nebulizer solution Take 3 mLs (2.5 mg total) by nebulization every 6 (six) hours as needed for wheezing or shortness of breath. 03/31/22   Teodora Medici, FNP  albuterol (VENTOLIN HFA) 108 (90 Base) MCG/ACT inhaler Inhale 1 puff into the lungs every 6 (six) hours as needed for wheezing or shortness  of breath. 10/28/20   Jaynee Eagles, PA-C  amLODipine (NORVASC) 5 MG tablet Take 5 mg by mouth daily.    [provider]  azelastine (ASTELIN) 0.1 % nasal spray Place 1 spray into both nostrils 2 (two) times daily. Use in each nostril as directed Patient taking differently: Place 1 spray into both nostrils See admin instructions. Instill 1 spray into each nostril two times a day as directed 06/04/18   [name], MD  diclofenac Sodium (VOLTAREN) 1 % GEL Apply topically 4 (four) times daily.    [provider]  DULERA 100-5 MCG/ACT AERO INHALE 2 PUFFS INTO THE LUNGS TWICE DAILY 11/15/18   Fenton Foy, NP  ipratropium (ATROVENT) 0.03 % nasal spray Place 2 sprays into both nostrils 3 (three) times daily. 12/28/21   Clemon Chambers, MD  levocetirizine (XYZAL) 5 MG tablet Take 1 tablet (5 mg  total) by mouth every evening. 02/22/22   Clemon Chambers, MD  montelukast (SINGULAIR) 10 MG tablet Take 1 tablet (10 mg total) by mouth at bedtime. 02/22/22   Clemon Chambers, MD  Olopatadine HCl 0.2 % SOLN Apply 1 drop to eye daily as needed. 12/28/21   Clemon Chambers, MD  oxybutynin (DITROPAN) 5 MG tablet Take 5 mg by mouth 2 (two) times daily. 11/07/21   [provider]  pantoprazole (PROTONIX) 40 MG tablet Take 1 tablet (40 mg total) by mouth 2 (two) times daily. 02/22/22   Clemon Chambers, MD  triamcinolone (NASACORT) 55 MCG/ACT AERO nasal inhaler Place 2 sprays into the nose daily. 02/22/22   Clemon Chambers, MD  Vitamin D, Ergocalciferol, (DRISDOL) 1.25 MG (50000 UT) CAPS capsule Take 50,000 Units by mouth once a week. 05/16/19   [provider]    Family History Family History  Problem Relation Age of Onset   Hypertension Mother    Other Father        unsure of medical history   Breast cancer Maternal Aunt    Allergic rhinitis Neg Hx    Angioedema Neg Hx    Asthma Neg Hx    Atopy Neg Hx    Eczema Neg Hx    Immunodeficiency Neg Hx    Urticaria Neg Hx     Social History Social History   Tobacco Use   Smoking status: Never    Passive exposure: Never   Smokeless tobacco: Never  Vaping Use   Vaping Use: Never used  Substance Use Topics   Alcohol use: Never    Comment: socially   Drug use: Never     Allergies   Patient has no known allergies.   Review of Systems Review of Systems  Constitutional:  Negative for chills and fever.  Eyes:  Negative for discharge and redness.  Respiratory:  Positive for cough, chest tightness and shortness of breath.   Gastrointestinal:  Negative for abdominal pain, nausea and vomiting.     Physical Exam Triage Vital Signs ED Triage Vitals  Enc Vitals Group     BP      Pulse      Resp      Temp      Temp src      SpO2      Weight      Height      Head Circumference      Peak Flow      Pain Score      Pain Loc       Pain Edu?  Excl. in [place]?    No data found.  Updated Vital Signs BP 114/79 (BP Location: Left Arm)   Pulse 93   Temp 98 F (36.7 C) (Oral)   Resp 18   SpO2 97%      Physical Exam Vitals and nursing note reviewed.  Constitutional:      General: She is not in acute distress.    Appearance: Normal appearance. She is not ill-appearing.  HENT:     Head: Normocephalic and atraumatic.  Eyes:     Conjunctiva/sclera: Conjunctivae normal.  Cardiovascular:     Rate and Rhythm: Normal rate and regular rhythm.     Heart sounds: Normal heart sounds.  Pulmonary:     Effort: Pulmonary effort is normal. No respiratory distress.     Breath sounds: No wheezing, rhonchi or rales.     Comments: Breath sounds mildly diminished throughout. Neurological:     Mental Status: She is alert.  Psychiatric:        Mood and Affect: Mood normal.        Behavior: Behavior normal.        Thought Content: Thought content normal.      UC Treatments / Results  Labs (all labs ordered are listed, but only abnormal results are displayed) Labs Reviewed - No data to display  EKG   Radiology No results found.  Procedures Procedures (including critical care time)  Medications Ordered in UC Medications - No data to display  Initial Impression / Assessment and Plan / UC Course  I have reviewed the triage vital signs and the nursing notes.  Pertinent labs & imaging results that were available during my care of the patient were reviewed by me and considered in my medical decision making (see chart for details).    Steroid burst prescribed for suspected asthma exacerbation.  Given this is her second exacerbation in less than 2 months recommended strongly that she follow-up with her PCP.  Encouraged further evaluation in the emergency department if symptoms or not improving or worsen.  Patient expresses understanding.  Final Clinical Impressions(s) / UC Diagnoses   Final diagnoses:  Asthma  with acute exacerbation, unspecified asthma severity, unspecified whether persistent   Discharge Instructions   None    ED Prescriptions     Medication Sig Dispense Auth. Provider   predniSONE (DELTASONE) 20 MG tablet Take 2 tablets (40 mg total) by mouth daily with breakfast for 5 days. 10 tablet Francene Finders, PA-C      PDMP not reviewed this encounter.   Francene Finders, PA-C 05/05/22 1950

## 2022-05-07 ENCOUNTER — Encounter (HOSPITAL_COMMUNITY): Payer: Self-pay | Admitting: Emergency Medicine

## 2022-05-07 ENCOUNTER — Emergency Department (HOSPITAL_COMMUNITY): Payer: Medicaid Other

## 2022-05-07 ENCOUNTER — Emergency Department (HOSPITAL_COMMUNITY)
Admission: EM | Admit: 2022-05-07 | Discharge: 2022-05-07 | Disposition: A | Discharge status: Home / Self Care | Payer: Medicaid Other | Attending: Emergency Medicine | Admitting: Emergency Medicine

## 2022-05-07 ENCOUNTER — Other Ambulatory Visit: Payer: Self-pay

## 2022-05-07 DIAGNOSIS — J4530 Mild persistent asthma, uncomplicated: Secondary | ICD-10-CM | POA: Diagnosis not present

## 2022-05-07 DIAGNOSIS — J40 Bronchitis, not specified as acute or chronic: Secondary | ICD-10-CM | POA: Insufficient documentation

## 2022-05-07 DIAGNOSIS — R0602 Shortness of breath: Secondary | ICD-10-CM | POA: Diagnosis present

## 2022-05-07 DIAGNOSIS — Z8616 Personal history of COVID-19: Secondary | ICD-10-CM | POA: Insufficient documentation

## 2022-05-07 DIAGNOSIS — I10 Essential (primary) hypertension: Secondary | ICD-10-CM | POA: Diagnosis not present

## 2022-05-07 DIAGNOSIS — Z79899 Other long term (current) drug therapy: Secondary | ICD-10-CM | POA: Insufficient documentation

## 2022-05-07 LAB — BRAIN NATRIURETIC PEPTIDE: B Natriuretic Peptide: 31 pg/mL (ref 0.0–100.0)

## 2022-05-07 LAB — CBC WITH DIFFERENTIAL/PLATELET
Abs Immature Granulocytes: 0.07 10*3/uL (ref 0.00–0.07)
Basophils Absolute: 0 10*3/uL (ref 0.0–0.1)
Basophils Relative: 0 %
Eosinophils Absolute: 0 10*3/uL (ref 0.0–0.5)
Eosinophils Relative: 0 %
HCT: 38.9 % (ref 36.0–46.0)
Hemoglobin: 12.1 g/dL (ref 12.0–15.0)
Immature Granulocytes: 0 %
Lymphocytes Relative: 16 %
Lymphs Abs: 2.6 10*3/uL (ref 0.7–4.0)
MCH: 25.5 pg — ABNORMAL LOW (ref 26.0–34.0)
MCHC: 31.1 g/dL (ref 30.0–36.0)
MCV: 82.1 fL (ref 80.0–100.0)
Monocytes Absolute: 1.7 10*3/uL — ABNORMAL HIGH (ref 0.1–1.0)
Monocytes Relative: 11 %
Neutro Abs: 11.8 10*3/uL — ABNORMAL HIGH (ref 1.7–7.7)
Neutrophils Relative %: 73 %
Platelets: 263 10*3/uL (ref 150–400)
RBC: 4.74 MIL/uL (ref 3.87–5.11)
RDW: 12.8 % (ref 11.5–15.5)
WBC: 16.2 10*3/uL — ABNORMAL HIGH (ref 4.0–10.5)
nRBC: 0 % (ref 0.0–0.2)

## 2022-05-07 LAB — BASIC METABOLIC PANEL
Anion gap: 7 (ref 5–15)
BUN: 18 mg/dL (ref 6–20)
CO2: 25 mmol/L (ref 22–32)
Calcium: 9.5 mg/dL (ref 8.9–10.3)
Chloride: 108 mmol/L (ref 98–111)
Creatinine, Ser: 0.93 mg/dL (ref 0.44–1.00)
GFR, Estimated: >60 mL/min (ref >60)
Glucose, Bld: 102 mg/dL — ABNORMAL HIGH (ref 70–99)
Potassium: 4.3 mmol/L (ref 3.5–5.1)
Sodium: 140 mmol/L (ref 135–145)

## 2022-05-07 LAB — TROPONIN I (HIGH SENSITIVITY)
Troponin I (High Sensitivity): 12 ng/L (ref <18)
Troponin I (High Sensitivity): 12 ng/L (ref <18)

## 2022-05-07 LAB — D-DIMER, QUANTITATIVE: D-Dimer, Quant: <0.27 ug/mL-FEU (ref 0.00–0.50)

## 2022-05-07 MED ORDER — DOXYCYCLINE HYCLATE 100 MG PO TABS
100.0000 mg | ORAL_TABLET | Freq: Once | ORAL | Status: AC
  Administered 2022-05-07: 100 mg via ORAL
  Filled 2022-05-07: qty 1

## 2022-05-07 MED ORDER — DOXYCYCLINE HYCLATE 100 MG PO CAPS
100.0000 mg | ORAL_CAPSULE | Freq: Two times a day (BID) | ORAL | 0 refills | Status: AC
Start: 2022-05-07 — End: 2022-05-14

## 2022-05-07 NOTE — ED Triage Notes (Signed)
Pt states she has had exacerbation of her asthma and was seen by urgent care on Thursday and started on Prednisone.  She has been using inhaler and nebs at home with no relief

## 2022-05-07 NOTE — ED Notes (Signed)
Ambulated pt with pulse ox, pt had no complaints and O2 stayed at 100 percent

## 2022-05-07 NOTE — ED Provider Notes (Signed)
[facility] EMERGENCY DEPARTMENT Provider Note  CSN: [id number] Arrival date & time: 05/07/22 0012  Chief Complaint(s) Shortness of Breath  HPI Joyce Welch is a 51 y.o. female h/o asthma   The history is provided by the patient.  Shortness of Breath Severity:  Moderate Onset quality:  Gradual Duration:  1 week Timing:  Constant Progression:  Waxing and waning Chronicity:  Recurrent Relieved by:  Nothing Worsened by:  Activity Ineffective treatments:  Inhaler Associated symptoms: chest pain (tightness) and cough   Associated symptoms: no diaphoresis, no ear pain, no fever, no hemoptysis, no sputum production, no vomiting and no wheezing   Risk factors: no hx of PE/DVT and no prolonged immobilization    Treated for asthma exacerbation with steroids at [facility] on 8/4. No improvement. Getting worse.  Past Medical History Past Medical History:  Diagnosis Date   Asthma    Chronic cough    COVID-19 09/18/2021   Fibroids    GERD (gastroesophageal reflux disease)    Hypertension    OAB (overactive bladder)    UTI (urinary tract infection)    Patient Active Problem List   Diagnosis Date Noted   Allergic conjunctivitis of both eyes 02/22/2022   Perennial allergic rhinitis 02/22/2022   Gastroesophageal reflux disease 02/22/2022   Mild persistent asthma without complication 29/51/8841   Chest pain of uncertain etiology 66/03/3015   Heart murmur 12/06/2021   Paresthesia 10/17/2018   Weakness 10/17/2018   Cough 06/20/2018   Post-nasal drip 06/20/2018   Globus sensation 06/20/2018   Asthma exacerbation 10/31/2017   Leukocytosis 10/31/2017   Strain of quadriceps tendon 02/14/2014   Symptomatic menopausal or female climacteric states 07/31/2013   Home Medication(s) Prior to Admission medications   Medication Sig Start Date End Date Taking? Authorizing Provider  doxycycline (VIBRAMYCIN) 100 MG capsule Take 1 capsule (100 mg total) by mouth 2 (two) times  daily for 7 days. 05/07/22 05/14/22 Yes Fredderick Swanger, Grayce Sessions, MD  albuterol (PROVENTIL) (2.5 MG/3ML) 0.083% nebulizer solution Take 3 mLs (2.5 mg total) by nebulization every 6 (six) hours as needed for wheezing or shortness of breath. 10/28/20   Jaynee Eagles, PA-C  albuterol (PROVENTIL) (2.5 MG/3ML) 0.083% nebulizer solution Take 3 mLs (2.5 mg total) by nebulization every 6 (six) hours as needed for wheezing or shortness of breath. 03/31/22   Teodora Medici, FNP  albuterol (VENTOLIN HFA) 108 (90 Base) MCG/ACT inhaler Inhale 1 puff into the lungs every 6 (six) hours as needed for wheezing or shortness of breath. 10/28/20   Jaynee Eagles, PA-C  amLODipine (NORVASC) 5 MG tablet Take 5 mg by mouth daily.    [provider]  azelastine (ASTELIN) 0.1 % nasal spray Place 1 spray into both nostrils 2 (two) times daily. Use in each nostril as directed Patient taking differently: Place 1 spray into both nostrils See admin instructions. Instill 1 spray into each nostril two times a day as directed 06/04/18   [name], MD  diclofenac Sodium (VOLTAREN) 1 % GEL Apply topically 4 (four) times daily.    [provider]  DULERA 100-5 MCG/ACT AERO INHALE 2 PUFFS INTO THE LUNGS TWICE DAILY 11/15/18   Fenton Foy, NP  ipratropium (ATROVENT) 0.03 % nasal spray Place 2 sprays into both nostrils 3 (three) times daily. 12/28/21   Clemon Chambers, MD  levocetirizine (XYZAL) 5 MG tablet Take 1 tablet (5 mg total) by mouth every evening. 02/22/22   Clemon Chambers, MD  montelukast (SINGULAIR) 10  MG tablet Take 1 tablet (10 mg total) by mouth at bedtime. 02/22/22   Clemon Chambers, MD  Olopatadine HCl 0.2 % SOLN Apply 1 drop to eye daily as needed. 12/28/21   Clemon Chambers, MD  oxybutynin (DITROPAN) 5 MG tablet Take 5 mg by mouth 2 (two) times daily. 11/07/21   [provider]  pantoprazole (PROTONIX) 40 MG tablet Take 1 tablet (40 mg total) by mouth 2 (two) times daily. 02/22/22   Clemon Chambers, MD   predniSONE (DELTASONE) 20 MG tablet Take 2 tablets (40 mg total) by mouth daily with breakfast for 5 days. 05/05/22 05/10/22  Francene Finders, PA-C  triamcinolone (NASACORT) 55 MCG/ACT AERO nasal inhaler Place 2 sprays into the nose daily. 02/22/22   Clemon Chambers, MD  Vitamin D, Ergocalciferol, (DRISDOL) 1.25 MG (50000 UT) CAPS capsule Take 50,000 Units by mouth once a week. 05/16/19   [provider]                                                                                                                                    Allergies Patient has no known allergies.  Review of Systems Review of Systems  Constitutional:  Negative for diaphoresis and fever.  HENT:  Negative for ear pain.   Respiratory:  Positive for cough and shortness of breath. Negative for hemoptysis, sputum production and wheezing.   Cardiovascular:  Positive for chest pain (tightness).  Gastrointestinal:  Negative for vomiting.   As noted in HPI  Physical Exam Vital Signs  I have reviewed the triage vital signs BP (!) 148/83 (BP Location: Right Wrist)   Pulse 86   Temp 98 F (36.7 C) (Oral)   Resp 10   SpO2 100%   Physical Exam Vitals reviewed.  Constitutional:      General: She is not in acute distress.    Appearance: She is well-developed. She is obese. She is not diaphoretic.  HENT:     Head: Normocephalic and atraumatic.     Nose: Nose normal.  Eyes:     General: No scleral icterus.       Right eye: No discharge.        Left eye: No discharge.     Conjunctiva/sclera: Conjunctivae normal.     Pupils: Pupils are equal, round, and reactive to light.  Cardiovascular:     Rate and Rhythm: Normal rate and regular rhythm.     Heart sounds: No murmur heard.    No friction rub. No gallop.  Pulmonary:     Effort: Pulmonary effort is normal. No respiratory distress.     Breath sounds: Normal breath sounds. No stridor or decreased air movement. No wheezing, rhonchi or rales.  Abdominal:      General: There is no distension.     Palpations: Abdomen is soft.     Tenderness: There is no abdominal tenderness.  Musculoskeletal:  General: No tenderness.     Cervical back: Normal range of motion and neck supple.  Skin:    General: Skin is warm and dry.     Findings: No erythema or rash.  Neurological:     Mental Status: She is alert and oriented to person, place, and time.     ED Results and Treatments Labs (all labs ordered are listed, but only abnormal results are displayed) Labs Reviewed  CBC WITH DIFFERENTIAL/PLATELET - Abnormal; Notable for the following components:      Result Value   WBC 16.2 (*)    MCH 25.5 (*)    Neutro Abs 11.8 (*)    Monocytes Absolute 1.7 (*)    All other components within normal limits  BASIC METABOLIC PANEL - Abnormal; Notable for the following components:   Glucose, Bld 102 (*)    All other components within normal limits  D-DIMER, QUANTITATIVE  BRAIN NATRIURETIC PEPTIDE  TROPONIN I (HIGH SENSITIVITY)  TROPONIN I (HIGH SENSITIVITY)                                                                                                                         EKG  EKG Interpretation  Date/Time:  Sunday May 07 2022 06:53:25 EDT Ventricular Rate:  76 PR Interval:  166 QRS Duration: 79 QT Interval:  388 QTC Calculation: 437 R Axis:   16 Text Interpretation: Sinus rhythm Low voltage, precordial leads Consider anterior infarct No significant change was found Confirmed by Addison Lank [phone]) on 05/07/2022 6:58:51 AM       Radiology DG Chest 2 View  Result Date: 05/07/2022 CLINICAL DATA:  Shortness of breath.  Asthma exacerbation. EXAM: CHEST - 2 VIEW COMPARISON:  11/25/2021 FINDINGS: The cardiomediastinal contours are normal. Peribronchial thickening. Pulmonary vasculature is normal. No consolidation, pleural effusion, or pneumothorax. No acute osseous abnormalities are seen. IMPRESSION: Peribronchial thickening consistent with asthma  or bronchitis. Electronically Signed   By: Keith Rake M.D.   On: 05/07/2022 01:49    Pertinent labs & imaging results that were available during my care of the patient were reviewed by me and considered in my medical decision making (see MDM for details).  Medications Ordered in ED Medications  doxycycline (VIBRA-TABS) tablet 100 mg (100 mg Oral Given 05/07/22 0737)  Procedures Procedures  (including critical care time)  Medical Decision Making / ED Course    Complexity of Problem:  Co-morbidities/SDOH that complicate the patient evaluation/care: Noted above in HPI  Patient's presenting problem/concern, DDX, and MDM listed below: Shortness of breath with dyspnea on exertion Lungs clear to auscultation bilaterally We will assess for pneumonia, pneumothorax,, pleural effusions. We will also assess for possible PE.  Patient is not tachycardic or hypoxic.  We will obtain a dimer. We will also assess for anemia  We will also assess for heart failure. Low suspicion for ACS but will rule out with delta tropes.  Hospitalization Considered:  yes  Initial Intervention:  monitor    Complexity of Data:   Cardiac Monitoring: The patient was maintained on a cardiac monitor.   I personally viewed and interpreted the cardiac monitored which showed an underlying rhythm of normal sinus rhythm with rates in the 80s to 90s EKG without acute ischemic changes, dysrhythmias or blocks  Laboratory Tests ordered listed below with my independent interpretation: CBC with leukocytosis likely secondary to steroid use.  No anemia Metabolic panel without significant electrolyte derangements or renal sufficiency Dimer negative Serial tropes negative BNP negative   Imaging Studies ordered listed below with my independent interpretation: Chest x-ray peribronchial  thickening consistent with asthma/bronchitis.  No focal opacities.     ED Course:    Assessment, Add'l Intervention, and Reassessment: Shortness of breath with dyspnea on exertion Patient is satting well on room air. No wheezing on exam requiring breathing treatments Work-up is reassuring.  For serious etiologies noted above Likely bronchitis.  Will provide patient with steroids for possible atypical bacterial bronchitis.    Final Clinical Impression(s) / ED Diagnoses Final diagnoses:  Bronchitis   The patient appears reasonably screened and/or stabilized for discharge and I doubt any other medical condition or other [facility] requiring further screening, evaluation, or treatment in the ED at this time. I have discussed the findings, Dx and Tx plan with the patient/family who expressed understanding and agree(s) with the plan. Discharge instructions discussed at length. The patient/family was given strict return precautions who verbalized understanding of the instructions. No further questions at time of discharge.  Disposition: Discharge  Condition: Good  ED Discharge Orders          Ordered    doxycycline (VIBRAMYCIN) 100 MG capsule  2 times daily        05/07/22 0749              Follow Up: [name], MD [place] [id number] [phone]  Call  to schedule an appointment for close follow up           This chart was dictated using voice recognition software.  Despite best efforts to proofread,  errors can occur which can change the documentation meaning.    Fatima Blank, MD 05/07/22 [phone]

## 2022-05-31 ENCOUNTER — Ambulatory Visit (INDEPENDENT_AMBULATORY_CARE_PROVIDER_SITE_OTHER): Payer: Medicaid Other | Admitting: Internal Medicine

## 2022-05-31 ENCOUNTER — Other Ambulatory Visit: Payer: Self-pay

## 2022-05-31 ENCOUNTER — Encounter: Payer: Self-pay | Admitting: Internal Medicine

## 2022-05-31 VITALS — BP 128/78 | HR 80 | Temp 97.8°F | Resp 18 | Ht 64.0 in | Wt 248.8 lb

## 2022-05-31 DIAGNOSIS — K219 Gastro-esophageal reflux disease without esophagitis: Secondary | ICD-10-CM | POA: Diagnosis not present

## 2022-05-31 DIAGNOSIS — H1013 Acute atopic conjunctivitis, bilateral: Secondary | ICD-10-CM | POA: Diagnosis not present

## 2022-05-31 DIAGNOSIS — J4530 Mild persistent asthma, uncomplicated: Secondary | ICD-10-CM

## 2022-05-31 DIAGNOSIS — R0989 Other specified symptoms and signs involving the circulatory and respiratory systems: Secondary | ICD-10-CM

## 2022-05-31 DIAGNOSIS — J3089 Other allergic rhinitis: Secondary | ICD-10-CM

## 2022-05-31 MED ORDER — CROMOLYN SODIUM 4 % OP SOLN: 1.0000 [drp] | Freq: Four times a day (QID) | OPHTHALMIC | 3 refills | Status: DC | PRN

## 2022-05-31 MED ORDER — DULERA 100-5 MCG/ACT IN AERO
2.0000 | INHALATION_SPRAY | Freq: Two times a day (BID) | RESPIRATORY_TRACT | 4 refills | Status: AC
Start: 2022-05-31 — End: ?

## 2022-05-31 NOTE — Patient Instructions (Addendum)
Perennial (year round) allergies: - allergen avoidance toward indoor/outdoor molds and dust mites, cockroach - consider allergy shots as long term control of your symptoms by teaching your immune system to be more tolerant of your allergy triggers - Continue flonase (fluticasone) 1-2 sprays in each nostril daily. - Continue Astelin (Azelastine) 1-2 sprays in each nostril twice a day as needed.   - Start Atrovent (Ipratropium Bromide) 1-2 sprays in each nostril up to 3 times a day as needed for runny nose/post nasal drip/drainage.  Use less frequently if airway gets too dry. - Continue Singulair (Montelukast) '10mg'$  nightly. - Continue Claritin (loratadine) 10 mg daily.  Allergic Conjunctivitis:  - Continue Cromolyn 1 drop each eye up to 4 times daily -Avoid eye drops that say red eye relief as they may contain medications that dry out your eyes. - Get your yearly eye exam.   REFLUX:  - lifestyle modifications as below - Continue pantoprazole 40 mg in the morning and at night - follow up with GI as scheduled  Mild Persistent Asthma: stable - your lung testing today looked similar to last visit - Controller Inhaler: Increase Dulera 100 2 puffs twice a day; This Should Be Used Everyday During Asthma Flare: Increase Dulera 100 to 3 puffs TWICE a day; continue for 2 weeks or until symptoms resolves - Rinse mouth out after use - Rescue Inhaler: Albuterol (Proair/Ventolin) 2 puffs . Use  every 4-6 hours as needed for chest tightness, wheezing, or coughing.  Can also use 15 minutes prior to exercise if you have symptoms with activity. - Asthma is not controlled if:  - Symptoms are occurring >2 times a week OR  - >2 times a month nighttime awakenings  - You are requiring systemic steroids (prednisone/steroid injections) more than once per year  - Your require hospitalization for your asthma.  - Please call the clinic to schedule a follow up if these symptoms arise  Follow-up in 4 months, sooner  if needed.  It was a pleasure seeing you again in clinic today. --------------------------------------------------------------------------------------  DUST MITE AVOIDANCE MEASURES:  There are three main measures that need and can be taken to avoid house dust mites:  Reduce accumulation of dust in general -reduce furniture, clothing, carpeting, books, stuffed animals, especially in bedroom  Separate yourself from the dust -use pillow and mattress encasements (can be found at stores such as [employer] or online) -avoid direct exposure to air condition flow -use a HEPA filter device, especially in the bedroom; you can also use a HEPA filter vacuum cleaner -wipe dust with a moist towel instead of a dry towel or broom when cleaning  Decrease mites and/or their secretions -wash clothing and linen and stuffed animals at highest temperature possible, at least every 2 weeks -stuffed animals can also be placed in a bag and put in a freezer overnight  Despite the above measures, it is impossible to eliminate dust mites or their allergen completely from your home.  With the above measures the burden of mites in your home can be diminished, with the goal of minimizing your allergic symptoms.  Success will be reached only when implementing and using all means together.  Control of Cockroach Allergen  Cockroach allergen has been identified as an important cause of acute attacks of asthma, especially in urban settings.  There are fifty-five species of cockroach that exist in the [place], however only three, the [place], [place] species produce allergen that can affect patients with Asthma.  Allergens can be obtained from fecal particles, egg casings and secretions from cockroaches.    Remove food sources. Reduce access to water. Seal access and entry points. Spray runways with 0.5-1% Diazinon or Chlorpyrifos Blow boric acid power under stoves and refrigerator. Place  bait stations (hydramethylnon) at feeding sites.  Control of Mold Allergen   Mold and fungi can grow on a variety of surfaces provided certain temperature and moisture conditions exist.  Outdoor molds grow on plants, decaying vegetation and soil.  The major outdoor mold, Alternaria and Cladosporium, are found in very high numbers during hot and dry conditions.  Generally, a late Summer - Fall peak is seen for common outdoor fungal spores.  Rain will temporarily lower outdoor mold spore count, but counts rise rapidly when the rainy period ends.  The most important indoor molds are Aspergillus and Penicillium.  Dark, humid and poorly ventilated basements are ideal sites for mold growth.  The next most common sites of mold growth are the bathroom and the kitchen.  Outdoor (Seasonal) Mold Control  Use air conditioning and keep windows closed Avoid exposure to decaying vegetation. Avoid leaf raking. Avoid grain handling. Consider wearing a face mask if working in moldy areas.    Indoor (Perennial) Mold Control   Maintain humidity below 50%. Clean washable surfaces with 5% bleach solution. Remove sources e.g. contaminated carpets.   CHRONIC THROAT CLEARING-WHAT TO DO!! Causes of chronic throat clearing may include the following (among others):  Acid reflux (lanyngopharyngeal reflux) Allergies (pollens, pet dander, dust mites, etc) Non allergic rhinitis (runny nose, post nasal drainage or congestion NOT caused by allergies-can be secondary to pollutants, cold air, changes in blood vessels to the nose as we age,etc) Environmental irritants (tobacco, smoke, air pollution) Asthma If present for a long period of time, throat clearing can become a habit. We will work with you to treat any of the medical reasons for throat clearing.  Your job is to help prevent the habit, which can cause damage (redness and swelling) to your vocal cords.  It will require a conscious effort on your behalf.  Tips  for prevention of throat clearing:  Instead of clearing your throat, swallow instead.   Carrying around water (or something to drink) will help you move the mucus in the right direction.  IF you have the urge to clear your throat, drink your water. If you absolutely have to clear your throat, use a non-traumatic exercise to do so.   Pant with your mouth open saying "[place], [place], [place]" with a powerful, breathy voice. Increase water intake.  This thins secretions, making them easier to swallow. Chew baking soda gum ([employer]) which can help with swallowing, reflux, and throat clearing.  Try to chew up to three times daily.   If you experience jaw pain or headaches, decrease the amount of chewing. Suck on sugar free hard candy to help with swallowing. Have your friends and family remind you to swallow when they hear you throat clearing.  As this can be habit forming, sometimes you may not realize you are doing this.  Having someone point it out to you, will help you become more conscious of the behavior. BE PATIENT.  This will take time to resolve, and some do not see improvement until 8-12 weeks into therapy/behavior modifications

## 2022-05-31 NOTE — Progress Notes (Signed)
FOLLOW UP Date of Service/Encounter:  05/31/22   Subjective:  Joyce Welch (DOB: 1970/12/19) is a 51 y.o. female who returns to the [facility] [place] on 05/31/2022 in re-evaluation of the following:  asthma, allergic rhinitis and conjunctivitis, GERD History obtained from: chart review and patient.  For Review, LV was on 02/22/22  with Dr.Yandell Mcjunkins seen for routine follow-up.  At her last visit, her cough was improved and now is only symptomatic when eating foods that were too cold such as ice cream.  She is still having phlegm in her throat despite combination of nasal sprays antihistamines and Singulair.  She is planning for an upcoming colonoscopy due to history of gastritis. Her FEV1 was 69% predicted with a nonobstructive ratio, possible restrictive disease thought likely related to body habitus.  We reviewed use of nasal sprays, switched her to a more potent antihistamine and discussed LPR and its effects on upper airway inflammation. We will increase her protonix to BID dosing. Her asthma remains controlled.  ----------------------------------------------------------- Interim history: ED visit on 05/07/22: asthma exacerbation with steroids at [facility] on 8/4. No improvement. Getting worse. Treated with doxycylcline.   Today presents for follow-up. Cough/asthma: She did feel improvement since stopping Doxycycline.  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  Starting back, she has constant mucus production but she is not coughing up phlegm.  She did have scab in her nose this morning.  She is not having any trouble breathing.  She is only using her Dulera 100, 2 puffs daily.  Allergic rhinitis: She is using flonase and astelin on a regular basis.  She is taking montelukast at bedtime.  She was switched to loratadine by her primary care physician.  She is unclear why.  Thinks possibly related to insurance coverage.  Reflux: controlled on  pantoprazole 40 mg BID  Additionally she is seeking help from a nutritionist for weight loss.  Her recurrent use of corticosteroids has increased her weight significantly.  ------------------------------------ Pertinent History/Diagnostics:  - Cough:  starts following URI leading to PND then cough,                 - sinus CT: 07/19/18: Moderate right maxillary sinus mucosal thickening                - Chest CT: 07/29/18: 1. Unremarkable CT examination the chest. No pulmonary abnormalities and no mediastinal or hilar mass or adenopathy. 2. Normal appearance of the heart and great vessels                - hx of sinus surgery Jan 2020 (right maxillary antrostomy with removal of fungal ball), cough improved after surgery but returned within one month                - cardiology visit on 12/06/2021.  EKG was reported as normal sinus rhythm without ischemic changes.                - Echo I on 12/22/21: The LV outflow tract is narrow and there is mild systolic anterior motion of the mitral valve chordae. LVOT flow is turbulent, but without true obstruction at rest. Vallsalva maneuver was not performed. Left ventricular ejection fraction, by estimation, is 70 to 75%. The left ventricle has hyperdynamic function. The left ventricle has no regional wall motion abnormalities. There is moderate concentric left ventricular hypertrophy. Left ventricular diastolic parameters are consistent with Grade I diastolic dysfunction (  impaired relaxation). Otherwise normal impression. - 12/20/21: coronary CT: 1. Calcium score 0 2.  Normal ascending thoracic aorta 3.2 cm 3.  CAD RADS 0 no significant CAD Left dominant system - 11/26/21: CT angio: No PE, No acute abnormalities - Spiro from 2019: normal ratio (91%) and FEV1 (86%)                - spirometry (12/28/21): ratio 121%, 73% FEV1 (pre),  75% FEV1 (post)-showed restriction without bronchodilator response  -Chest x-ray 05/07/2022: Peribronchial thickening consistent  with asthma or bronchitis - Allergic Rhinitis: taking azelastine, loratadine, flonase every day.  She has taken montelukast in the past per review of records, but doesn't remember why or when this was stopped.                 - SPT environmental panel (12/28/21): + dreschlera, epicoccum, dust mites, IDs + cockroach  Allergies as of 05/31/2022   No Known Allergies      Medication List        Accurate as of May 31, 2022  4:42 PM. If you have any questions, ask your nurse or doctor.          STOP taking these medications    levocetirizine 5 MG tablet Commonly known as: XYZAL Stopped by: Clemon Chambers, MD   Olopatadine HCl 0.2 % Soln Stopped by: Clemon Chambers, MD       TAKE these medications    albuterol (2.5 MG/3ML) 0.083% nebulizer solution Commonly known as: PROVENTIL Take 3 mLs (2.5 mg total) by nebulization every 6 (six) hours as needed for wheezing or shortness of breath.   albuterol 108 (90 Base) MCG/ACT inhaler Commonly known as: VENTOLIN HFA Inhale 1 puff into the lungs every 6 (six) hours as needed for wheezing or shortness of breath.   albuterol (2.5 MG/3ML) 0.083% nebulizer solution Commonly known as: PROVENTIL Take 3 mLs (2.5 mg total) by nebulization every 6 (six) hours as needed for wheezing or shortness of breath.   amLODipine 5 MG tablet Commonly known as: NORVASC Take 5 mg by mouth daily.   azelastine 0.1 % nasal spray Commonly known as: ASTELIN Place 1 spray into both nostrils 2 (two) times daily. Use in each nostril as directed What changed:  when to take this additional instructions   cromolyn 4 % ophthalmic solution Commonly known as: OPTICROM Place 1 drop into both eyes 4 (four) times daily as needed. Started by: Clemon Chambers, MD   diclofenac Sodium 1 % Gel Commonly known as: VOLTAREN Apply topically 4 (four) times daily.   Dulera 100-5 MCG/ACT Aero Generic drug: mometasone-formoterol INHALE 2 PUFFS INTO THE LUNGS TWICE DAILY    ipratropium 0.03 % nasal spray Commonly known as: ATROVENT Place 2 sprays into both nostrils 3 (three) times daily.   montelukast 10 MG tablet Commonly known as: Singulair Take 1 tablet (10 mg total) by mouth at bedtime.   oxybutynin 5 MG tablet Commonly known as: DITROPAN Take 5 mg by mouth 2 (two) times daily.   pantoprazole 40 MG tablet Commonly known as: PROTONIX Take 1 tablet (40 mg total) by mouth 2 (two) times daily.   triamcinolone 55 MCG/ACT Aero nasal inhaler Commonly known as: NASACORT Place 2 sprays into the nose daily.   Vitamin D (Ergocalciferol) 1.25 MG (50000 UNIT) Caps capsule Commonly known as: DRISDOL Take 50,000 Units by mouth once a week.       Past Medical History:  Diagnosis Date   Asthma  Chronic cough    COVID-19 09/18/2021   Fibroids    GERD (gastroesophageal reflux disease)    Hypertension    OAB (overactive bladder)    UTI (urinary tract infection)    Past Surgical History:  Procedure Laterality Date   ABDOMINAL HYSTERECTOMY  02/05/2006   partial   CESAREAN SECTION  1994, 2002, 2005,    x 3   MAXILLARY ANTROSTOMY Right 10/07/2018   Procedure: RIGHT MAXILLARY ANTROSTOMY WITH TISSUE REMOVAL.;  Surgeon: Melida Quitter, MD;  Location: [place];  Service: ENT;  Laterality: Right;   Otherwise, there have been no changes to her past medical history, surgical history, family history, or social history.  ROS: All others negative except as noted per HPI.   Objective:  BP 128/78   Pulse 80   Temp 97.8 F (36.6 C)   Resp 18   Ht '5\' 4"'$  (1.626 m)   Wt 248 lb 12.8 oz (112.9 kg)   SpO2 99%   BMI 42.71 kg/m  Body mass index is 42.71 kg/m. Physical Exam: General Appearance:  Alert, cooperative, no distress, appears stated age, constant throat clearing throughout encounter  Head:  Normocephalic, without obvious abnormality, atraumatic  Eyes:  Conjunctiva clear, EOM's intact  Nose: Nares normal, hypertrophic turbinates,  normal mucosa, no visible anterior polyps, and septum midline  Throat: Lips, tongue normal; teeth and gums normal, normal posterior oropharynx and + cobblestoning  Neck: Supple, symmetrical  Lungs:   clear to auscultation bilaterally, Respirations unlabored, no coughing  Heart:  regular rate and rhythm and no murmur, Appears well perfused  Extremities: No edema  Skin: Skin color, texture, turgor normal, no rashes or lesions on visualized portions of skin  Neurologic: No gross deficits   Spirometry:  Tracings reviewed. Her effort: Good reproducible efforts. FVC: 1.66L FEV1: 1.61L, 70% predicted FEV1/FVC ratio: 120% Interpretation:  Nonobstructive ratio with mild restriction.  Similar to previous prior .  Please see scanned spirometry results for details.  Assessment/Plan   Perennial (year round) allergies: Partially controlled - allergen avoidance toward indoor/outdoor molds and dust mites, cockroach - consider allergy shots as long term control of your symptoms by teaching your immune system to be more tolerant of your allergy triggers - Continue flonase (fluticasone) 1-2 sprays in each nostril daily. - Continue Astelin (Azelastine) 1-2 sprays in each nostril twice a day as needed.   - Start Atrovent (Ipratropium Bromide) 1-2 sprays in each nostril up to 3 times a day as needed for runny nose/post nasal drip/drainage.  Use less frequently if airway gets too dry. - Continue Singulair (Montelukast) '10mg'$  nightly. - Continue Claritin (loratadine) 10 mg daily. -Discussed need to use chronic throat clearing exercises provided to avoid throat irritation  Allergic Conjunctivitis: controlled - Continue Cromolyn 1 drop each eye up to 4 times daily -Avoid eye drops that say red eye relief as they may contain medications that dry out your eyes. - Get your yearly eye exam.   REFLUX: controlled - lifestyle modifications as below - Continue pantoprazole 40 mg in the morning and at night -  follow up with GI as scheduled  Mild Persistent Asthma: Not well controlled, recent flare requiring OCS - your lung testing today looked similar to last visit - Controller Inhaler: Increase Dulera 100 2 puffs twice a day; This Should Be Used Everyday During Asthma Flare: Increase Dulera 100 to 3 puffs TWICE a day; continue for 2 weeks or until symptoms resolves - Rinse mouth out after use - Rescue  Inhaler: Albuterol (Proair/Ventolin) 2 puffs . Use  every 4-6 hours as needed for chest tightness, wheezing, or coughing.  Can also use 15 minutes prior to exercise if you have symptoms with activity. - Asthma is not controlled if:  - Symptoms are occurring >2 times a week OR  - >2 times a month nighttime awakenings  - You are requiring systemic steroids (prednisone/steroid injections) more than once per year  - Your require hospitalization for your asthma.  - Please call the clinic to schedule a follow up if these symptoms arise  Follow-up in 4 months, sooner if needed.   Sigurd Sos, MD  [facility]

## 2022-06-01 NOTE — Addendum Note (Signed)
Addended by: Chip Boer R on: 06/01/2022 04:17 PM   Modules accepted: Orders

## 2022-06-05 ENCOUNTER — Ambulatory Visit
Admission: RE | Admit: 2022-06-05 | Discharge: 2022-06-05 | Disposition: A | Discharge status: Home / Self Care | Payer: Medicaid Other | Source: Ambulatory Visit | Attending: Urgent Care | Admitting: Urgent Care

## 2022-06-05 VITALS — BP 136/83 | HR 80 | Temp 98.0°F | Resp 18

## 2022-06-05 DIAGNOSIS — J018 Other acute sinusitis: Secondary | ICD-10-CM | POA: Diagnosis not present

## 2022-06-05 DIAGNOSIS — R42 Dizziness and giddiness: Secondary | ICD-10-CM

## 2022-06-05 DIAGNOSIS — J3489 Other specified disorders of nose and nasal sinuses: Secondary | ICD-10-CM | POA: Diagnosis not present

## 2022-06-05 DIAGNOSIS — J4540 Moderate persistent asthma, uncomplicated: Secondary | ICD-10-CM

## 2022-06-05 DIAGNOSIS — R0982 Postnasal drip: Secondary | ICD-10-CM | POA: Diagnosis not present

## 2022-06-05 DIAGNOSIS — J309 Allergic rhinitis, unspecified: Secondary | ICD-10-CM

## 2022-06-05 MED ORDER — AMOXICILLIN-POT CLAVULANATE 875-125 MG PO TABS: 1.0000 | ORAL_TABLET | Freq: Two times a day (BID) | ORAL | 0 refills | Status: DC

## 2022-06-05 NOTE — ED Triage Notes (Signed)
Pt c/o cough, nasal drainage, dizziness, onset today. Was recently seen by asthma and allergy and told to increase inhaler. Denies pain.

## 2022-06-05 NOTE — ED Provider Notes (Signed)
[facility]  Note:  This document was prepared using Dragon voice recognition software and may include unintentional dictation errors.  MRN: [id number] DOB: [birth year]  Subjective:   Joyce Welch is a 51 y.o. female presenting for 1 week history of persistent sinus congestion, postnasal drainage, coughing, dizziness.  Patient has a history of persistent allergic rhinitis and was recently seen for recheck with her allergist and asthma specialist.  She was advised to use a nasal steroid, nasal antihistamine, oral antihistamine and Singulair.  They also advised that she use Dulera.  Of note, patient was diagnosed with bronchitis earlier.  She was treated with doxycycline earlier this month.  She also underwent a course of prednisone prior to that.  Patient is not a smoker.  Does not want a COVID test.  No current facility-administered medications for this encounter.  Current Outpatient Medications:    albuterol (PROVENTIL) (2.5 MG/3ML) 0.083% nebulizer solution, Take 3 mLs (2.5 mg total) by nebulization every 6 (six) hours as needed for wheezing or shortness of breath., Disp: 75 mL, Rfl: 0   albuterol (PROVENTIL) (2.5 MG/3ML) 0.083% nebulizer solution, Take 3 mLs (2.5 mg total) by nebulization every 6 (six) hours as needed for wheezing or shortness of breath., Disp: 75 mL, Rfl: 0   albuterol (VENTOLIN HFA) 108 (90 Base) MCG/ACT inhaler, Inhale 1 puff into the lungs every 6 (six) hours as needed for wheezing or shortness of breath., Disp: 18 g, Rfl: 0   amLODipine (NORVASC) 5 MG tablet, Take 5 mg by mouth daily., Disp: , Rfl:    azelastine (ASTELIN) 0.1 % nasal spray, Place 1 spray into both nostrils 2 (two) times daily. Use in each nostril as directed (Patient taking differently: Place 1 spray into both nostrils See admin instructions. Instill 1 spray into each nostril two times a day as directed), Disp: 30 mL, Rfl: 12   cromolyn (OPTICROM) 4 % ophthalmic solution, Place 1 drop  into both eyes 4 (four) times daily as needed., Disp: 10 mL, Rfl: 3   diclofenac Sodium (VOLTAREN) 1 % GEL, Apply topically 4 (four) times daily., Disp: , Rfl:    ipratropium (ATROVENT) 0.03 % nasal spray, Place 2 sprays into both nostrils 3 (three) times daily., Disp: 30 mL, Rfl: 12   mometasone-formoterol (DULERA) 100-5 MCG/ACT AERO, Inhale 2 puffs into the lungs 2 (two) times daily., Disp: 13 g, Rfl: 4   montelukast (SINGULAIR) 10 MG tablet, Take 1 tablet (10 mg total) by mouth at bedtime., Disp: 30 tablet, Rfl: 3   oxybutynin (DITROPAN) 5 MG tablet, Take 5 mg by mouth 2 (two) times daily., Disp: , Rfl:    pantoprazole (PROTONIX) 40 MG tablet, Take 1 tablet (40 mg total) by mouth 2 (two) times daily., Disp: 60 tablet, Rfl: 3   Vitamin D, Ergocalciferol, (DRISDOL) 1.25 MG (50000 UT) CAPS capsule, Take 50,000 Units by mouth once a week., Disp: , Rfl:    No Known Allergies  Past Medical History:  Diagnosis Date   Asthma    Chronic cough    COVID-19 09/18/2021   Fibroids    GERD (gastroesophageal reflux disease)    Hypertension    OAB (overactive bladder)    UTI (urinary tract infection)      Past Surgical History:  Procedure Laterality Date   ABDOMINAL HYSTERECTOMY  02/05/2006   partial   CESAREAN SECTION  1994, 2002, 2005,    x 3   MAXILLARY ANTROSTOMY Right 10/07/2018   Procedure: RIGHT MAXILLARY ANTROSTOMY WITH TISSUE  REMOVAL.;  Surgeon: Melida Quitter, MD;  Location: [place];  Service: ENT;  Laterality: Right;    Family History  Problem Relation Age of Onset   Hypertension Mother    Other Father        unsure of medical history   Breast cancer Maternal Aunt    Allergic rhinitis Neg Hx    Angioedema Neg Hx    Asthma Neg Hx    Atopy Neg Hx    Eczema Neg Hx    Immunodeficiency Neg Hx    Urticaria Neg Hx     Social History   Tobacco Use   Smoking status: Never    Passive exposure: Never   Smokeless tobacco: Never  Vaping Use   Vaping Use: Never used   Substance Use Topics   Alcohol use: Never    Comment: socially   Drug use: Never    ROS   Objective:   Vitals: BP 136/83 (BP Location: Left Arm)   Pulse 80   Temp 98 F (36.7 C) (Oral)   Resp 18   SpO2 97%   Physical Exam Constitutional:      General: She is not in acute distress.    Appearance: Normal appearance. She is well-developed and normal weight. She is not ill-appearing, toxic-appearing or diaphoretic.  HENT:     Head: Normocephalic and atraumatic.     Right Ear: Tympanic membrane, ear canal and external ear normal. No drainage or tenderness. No middle ear effusion. There is no impacted cerumen. Tympanic membrane is not erythematous.     Left Ear: Tympanic membrane, ear canal and external ear normal. No drainage or tenderness.  No middle ear effusion. There is no impacted cerumen. Tympanic membrane is not erythematous.     Nose: Congestion and rhinorrhea present.     Mouth/Throat:     Mouth: Mucous membranes are moist. No oral lesions.     Pharynx: Posterior oropharyngeal erythema (with post-nasal drainage) present. No pharyngeal swelling, oropharyngeal exudate or uvula swelling.     Tonsils: No tonsillar exudate or tonsillar abscesses.  Eyes:     General: No scleral icterus.       Right eye: No discharge.        Left eye: No discharge.     Extraocular Movements: Extraocular movements intact.     Right eye: Normal extraocular motion.     Left eye: Normal extraocular motion.     Conjunctiva/sclera: Conjunctivae normal.  Cardiovascular:     Rate and Rhythm: Normal rate and regular rhythm.     Heart sounds: Normal heart sounds. No murmur heard.    No friction rub. No gallop.  Pulmonary:     Effort: Pulmonary effort is normal. No respiratory distress.     Breath sounds: No stridor. No wheezing, rhonchi or rales.  Chest:     Chest wall: No tenderness.  Musculoskeletal:     Cervical back: Normal range of motion and neck supple.  Lymphadenopathy:     Cervical:  No cervical adenopathy.  Skin:    General: Skin is warm and dry.  Neurological:     General: No focal deficit present.     Mental Status: She is alert and oriented to person, place, and time.  Psychiatric:        Mood and Affect: Mood normal.        Behavior: Behavior normal.     Assessment and Plan :   PDMP not reviewed this encounter.  1. Other acute  sinusitis, recurrence not specified   2. Sinus pressure   3. Post-nasal drainage   4. Dizziness   5. Allergic rhinitis, unspecified seasonality, unspecified trigger   6. Moderate persistent asthma without complication     Will start empiric treatment for sinusitis with Augmentin.  Recommended supportive care otherwise including the continued use of oral antihistamine, decongestant.  Hold the nasal steroids.  Maintain asthma treatments.  Deferred imaging given clear cardiopulmonary exam, hemodynamically stable vital signs. Deferred imaging given clear cardiopulmonary exam, hemodynamically stable vital signs.  Recheck with the allergist and asthma specialist.  Counseled patient on potential for adverse effects with medications prescribed/recommended today, ER and return-to-clinic precautions discussed, patient verbalized understanding.    Jaynee Eagles, [place] 06/05/22 [id number]

## 2022-06-12 DIAGNOSIS — M545 Low back pain, unspecified: Secondary | ICD-10-CM | POA: Insufficient documentation

## 2022-06-26 NOTE — Progress Notes (Deleted)
FOLLOW UP Date of Service/Encounter:  06/26/22   Subjective:  Joyce Welch (DOB: 30-Mar-1971) is a 51 y.o. female who returns to the [facility] on 06/28/2022 in re-evaluation of the following: asthma, allergic rhinitis and conjunctivitis, GERD History obtained from: chart review and {Persons; PED relatives w/patient:19415::"patient"}.  For Review, LV was on 05/31/22  with Dr.Yanelle Sousa seen for routine follow-up. She had been seen in ED on 05/07/22 for asthma exacerbation and treated with doxycycline. Having constant mucu production.  Using Dulera 200, 2 puffs daily.  Her FEV1 was 70% predicted.  We started atrovent nasal spray, and increased her dulera to twice daily.   Interim hx: treated with Augmentin at ED visit on 06/05/22 for acute sinusitis.  Today presents for follow-up. ***  --------------------------------------------------------------- Pertinent History/Diagnostics:  - Cough:  starts following URI leading to PND then cough,                 - sinus CT: 07/19/18: Moderate right maxillary sinus mucosal thickening                - Chest CT: 07/29/18: 1. Unremarkable CT examination the chest. No pulmonary abnormalities and no mediastinal or hilar mass or adenopathy. 2. Normal appearance of the heart and great vessels                - hx of sinus surgery Jan 2020 (right maxillary antrostomy with removal of fungal ball), cough improved after surgery but returned within one month                - cardiology visit on 12/06/2021.  EKG was reported as normal sinus rhythm without ischemic changes.                - Echo I on 12/22/21: The LV outflow tract is narrow and there is mild systolic anterior motion of the mitral valve chordae. LVOT flow is turbulent, but without true obstruction at rest. Vallsalva maneuver was not performed. Left ventricular ejection fraction, by estimation, is 70 to 75%. The left ventricle has hyperdynamic function. The left ventricle has no  regional wall motion abnormalities. There is moderate concentric left ventricular hypertrophy. Left ventricular diastolic parameters are consistent with Grade I diastolic dysfunction (impaired relaxation). Otherwise normal impression. - 12/20/21: coronary CT: 1. Calcium score 0 2.  Normal ascending thoracic aorta 3.2 cm 3.  CAD RADS 0 no significant CAD Left dominant system - 11/26/21: CT angio: No PE, No acute abnormalities - Spiro from 2019: normal ratio (91%) and FEV1 (86%)                - spirometry (12/28/21): ratio 121%, 73% FEV1 (pre),  75% FEV1 (post)-showed restriction without bronchodilator response                -Chest x-ray 05/07/2022: Peribronchial thickening consistent with asthma or bronchitis - Allergic Rhinitis: taking azelastine, loratadine, flonase every day.  She has taken montelukast in the past per review of records, but doesn't remember why or when this was stopped.                 - SPT environmental panel (12/28/21): + dreschlera, epicoccum, dust mites, IDs + cockroach  Allergies as of 06/28/2022   No Known Allergies      Medication List        Accurate as of June 26, 2022  6:22 PM. If you have any questions, ask your nurse or doctor.  albuterol (2.5 MG/3ML) 0.083% nebulizer solution Commonly known as: PROVENTIL Take 3 mLs (2.5 mg total) by nebulization every 6 (six) hours as needed for wheezing or shortness of breath.   albuterol 108 (90 Base) MCG/ACT inhaler Commonly known as: VENTOLIN HFA Inhale 1 puff into the lungs every 6 (six) hours as needed for wheezing or shortness of breath.   albuterol (2.5 MG/3ML) 0.083% nebulizer solution Commonly known as: PROVENTIL Take 3 mLs (2.5 mg total) by nebulization every 6 (six) hours as needed for wheezing or shortness of breath.   amLODipine 5 MG tablet Commonly known as: NORVASC Take 5 mg by mouth daily.   amoxicillin-clavulanate 875-125 MG tablet Commonly known as: AUGMENTIN Take 1 tablet by  mouth 2 (two) times daily.   azelastine 0.1 % nasal spray Commonly known as: ASTELIN Place 1 spray into both nostrils 2 (two) times daily. Use in each nostril as directed What changed:  when to take this additional instructions   cromolyn 4 % ophthalmic solution Commonly known as: OPTICROM Place 1 drop into both eyes 4 (four) times daily as needed.   diclofenac Sodium 1 % Gel Commonly known as: VOLTAREN Apply topically 4 (four) times daily.   Dulera 100-5 MCG/ACT Aero Generic drug: mometasone-formoterol Inhale 2 puffs into the lungs 2 (two) times daily.   ipratropium 0.03 % nasal spray Commonly known as: ATROVENT Place 2 sprays into both nostrils 3 (three) times daily.   montelukast 10 MG tablet Commonly known as: Singulair Take 1 tablet (10 mg total) by mouth at bedtime.   oxybutynin 5 MG tablet Commonly known as: DITROPAN Take 5 mg by mouth 2 (two) times daily.   pantoprazole 40 MG tablet Commonly known as: PROTONIX Take 1 tablet (40 mg total) by mouth 2 (two) times daily.   Vitamin D (Ergocalciferol) 1.25 MG (50000 UNIT) Caps capsule Commonly known as: DRISDOL Take 50,000 Units by mouth once a week.       Past Medical History:  Diagnosis Date   Asthma    Chronic cough    COVID-19 09/18/2021   Fibroids    GERD (gastroesophageal reflux disease)    Hypertension    OAB (overactive bladder)    UTI (urinary tract infection)    Past Surgical History:  Procedure Laterality Date   ABDOMINAL HYSTERECTOMY  02/05/2006   partial   CESAREAN SECTION  1994, 2002, 2005,    x 3   MAXILLARY ANTROSTOMY Right 10/07/2018   Procedure: RIGHT MAXILLARY ANTROSTOMY WITH TISSUE REMOVAL.;  Surgeon: Melida Quitter, MD;  Location: [facility];  Service: ENT;  Laterality: Right;   Otherwise, there have been no changes to her past medical history, surgical history, family history, or social history.  ROS: All others negative except as noted per HPI.   Objective:  There  were no vitals taken for this visit. There is no height or weight on file to calculate BMI. Physical Exam: General Appearance:  Alert, cooperative, no distress, appears stated age  Head:  Normocephalic, without obvious abnormality, atraumatic  Eyes:  Conjunctiva clear, EOM's intact  Nose: Nares normal, {Blank multiple:19196:a:"***","hypertrophic turbinates","normal mucosa","no visible anterior polyps","septum midline"}  Throat: Lips, tongue normal; teeth and gums normal, {Blank multiple:19196:a:"***","normal posterior oropharynx","tonsils 2+","tonsils 3+","no tonsillar exudate","+ cobblestoning"}  Neck: Supple, symmetrical  Lungs:   {Blank multiple:19196:a:"***","clear to auscultation bilaterally","end-expiratory wheezing","wheezing throughout"}, Respirations unlabored, {Blank multiple:19196:a:"***","no coughing","intermittent dry coughing"}  Heart:  {Blank multiple:19196:a:"***","regular rate and rhythm","no murmur"}, Appears well perfused  Extremities: No edema  Skin: Skin color, texture, turgor  normal, no rashes or lesions on visualized portions of skin  Neurologic: No gross deficits   Reviewed: ***  Spirometry:  Tracings reviewed. Her effort: {Blank single:19197::"Good reproducible efforts.","It was hard to get consistent efforts and there is a question as to whether this reflects a maximal maneuver.","Poor effort, data can not be interpreted.","Variable effort-results affected.","decent for first attempt at spirometry."} FVC: ***L FEV1: ***L, ***% predicted FEV1/FVC ratio: ***% Interpretation: {Blank single:19197::"Spirometry consistent with mild obstructive disease","Spirometry consistent with moderate obstructive disease","Spirometry consistent with severe obstructive disease","Spirometry consistent with possible restrictive disease","Spirometry consistent with mixed obstructive and restrictive disease","Spirometry uninterpretable due to technique","Spirometry consistent with normal  pattern","No overt abnormalities noted given today's efforts"}.  Please see scanned spirometry results for details.  Skin Testing: {Blank single:19197::"Select foods","Environmental allergy panel","Environmental allergy panel and select foods","Food allergy panel","None","Deferred due to recent antihistamines use","deferred due to recent reaction"}. ***Adequate positive and negative controls Results discussed with patient/family.   {Blank single:19197::"Allergy testing results were read and interpreted by myself, documented by clinical staff."," "}  Assessment/Plan   ***  Sigurd Sos, MD  [facility]

## 2022-06-28 ENCOUNTER — Ambulatory Visit: Payer: Medicaid Other | Admitting: Internal Medicine

## 2022-07-11 DIAGNOSIS — M5416 Radiculopathy, lumbar region: Secondary | ICD-10-CM | POA: Insufficient documentation

## 2022-07-13 ENCOUNTER — Encounter: Payer: Self-pay | Admitting: Emergency Medicine

## 2022-07-13 ENCOUNTER — Ambulatory Visit
Admission: EM | Admit: 2022-07-13 | Discharge: 2022-07-13 | Disposition: A | Discharge status: Home / Self Care | Payer: Medicaid Other | Attending: Family Medicine | Admitting: Family Medicine

## 2022-07-13 DIAGNOSIS — N3090 Cystitis, unspecified without hematuria: Secondary | ICD-10-CM | POA: Diagnosis present

## 2022-07-13 DIAGNOSIS — R300 Dysuria: Secondary | ICD-10-CM | POA: Diagnosis present

## 2022-07-13 LAB — POCT URINALYSIS DIP (MANUAL ENTRY)
Bilirubin, UA: NEGATIVE
Glucose, UA: NEGATIVE mg/dL
Leukocytes, UA: NEGATIVE
Nitrite, UA: NEGATIVE
Spec Grav, UA: 1.025 (ref 1.010–1.025)
Urobilinogen, UA: 0.2 E.U./dL
pH, UA: 7 (ref 5.0–8.0)

## 2022-07-13 MED ORDER — CEPHALEXIN 500 MG PO CAPS: 500.0000 mg | ORAL_CAPSULE | Freq: Two times a day (BID) | ORAL | 0 refills | Status: DC

## 2022-07-13 NOTE — ED Provider Notes (Signed)
[facility]    ASSESSMENT & PLAN:  1. Dysuria   2. Cystitis    Begin: Meds ordered this encounter  Medications   cephALEXin (KEFLEX) 500 MG capsule    Sig: Take 1 capsule (500 mg total) by mouth 2 (two) times daily.    Dispense:  10 capsule    Refill:  0   No signs of pyelonephritis.  Urine culture sent. Will follow up with her PCP or here if not showing improvement over the next 48 hours, sooner if needed.  Outlined signs and symptoms indicating need for more acute intervention. Patient verbalized understanding. After Visit Summary given.  SUBJECTIVE:  Joyce Welch is a 51 y.o. female who complains of urinary frequency, urgency and dysuria for the past 1-2 weeks; off/on. Without associated fever, chills, vaginal discharge or bleeding. Mild LEFT flank discomfort. Gross hematuria: not present. No specific aggravating or alleviating factors reported. No LE edema. Normal PO intake without n/v/d. Without specific abdominal pain. Ambulatory without difficulty. No tx PTA. LMP: No LMP recorded. Patient has had a hysterectomy.   OBJECTIVE:  Vitals:   07/13/22 1618  BP: 119/80  Pulse: 97  Resp: 18  Temp: 98.3 F (36.8 C)  SpO2: 96%   General appearance: alert; no distress HENT: oropharynx: moist Lungs: unlabored respirations Abdomen: soft Extremities: no edema; symmetrical with no gross deformities Skin: warm and dry Neurologic: normal gait Psychological: alert and cooperative; normal mood and affect  Labs Reviewed  POCT URINALYSIS DIP (MANUAL ENTRY) - Abnormal; Notable for the following components:      Result Value   Ketones, POC UA trace (5) (*)    Blood, UA trace-intact (*)    Protein Ur, POC trace (*)    All other components within normal limits  URINE CULTURE    No Known Allergies  Past Medical History:  Diagnosis Date   Asthma    Chronic cough    COVID-19 09/18/2021   Fibroids    GERD (gastroesophageal reflux disease)     Hypertension    OAB (overactive bladder)    UTI (urinary tract infection)    Social History   Socioeconomic History   Marital status: Single    Spouse name: Not on file   Number of children: 3   Years of education: 12   Highest education level: High school graduate  Occupational History   Occupation: Mudlogger   Tobacco Use   Smoking status: Never    Passive exposure: Never   Smokeless tobacco: Never  Vaping Use   Vaping Use: Never used  Substance and Sexual Activity   Alcohol use: Never    Comment: socially   Drug use: Never   Sexual activity: Yes    Partners: Male    Birth control/protection: Surgical  Other Topics Concern   Not on file  Social History Narrative   Lives at home with her daughter.   Right-handed.   Caffeine use:  3 cups daily.   Social Determinants of Health   Financial Resource Strain: Not on file  Food Insecurity: Not on file  Transportation Needs: Not on file  Physical Activity: Not on file  Stress: Not on file  Social Connections: Not on file  Intimate Partner Violence: Not on file   Family History  Problem Relation Age of Onset   Hypertension Mother    Other Father        unsure of medical history   Breast cancer Maternal Aunt    Allergic rhinitis Neg  Hx    Angioedema Neg Hx    Asthma Neg Hx    Atopy Neg Hx    Eczema Neg Hx    Immunodeficiency Neg Hx    Urticaria Neg Hx         Vanessa Kick, MD 07/13/22 1816

## 2022-07-13 NOTE — Discharge Instructions (Signed)
You have had labs (urine culture) sent today. We will call you with any significant abnormalities or if there is need to begin or change treatment or pursue further follow up.  You may also review your test results online through MyChart. If you do not have a MyChart account, instructions to sign up should be on your discharge paperwork.  

## 2022-07-13 NOTE — ED Triage Notes (Signed)
Pt is present today with lower back and urinary frequency. Pt sx started 2 weeks ago.

## 2022-07-14 LAB — URINE CULTURE: Culture: <10000 — AB

## 2022-07-18 ENCOUNTER — Other Ambulatory Visit: Payer: Self-pay | Admitting: Internal Medicine

## 2022-07-20 LAB — URINE CULTURE
MICRO NUMBER:: 14061835
SPECIMEN QUALITY:: ADEQUATE

## 2022-08-11 ENCOUNTER — Ambulatory Visit: Payer: Medicaid Other

## 2022-08-11 ENCOUNTER — Ambulatory Visit
Admission: EM | Admit: 2022-08-11 | Discharge: 2022-08-11 | Disposition: A | Discharge status: Home / Self Care | Payer: Medicaid Other | Attending: Physician Assistant | Admitting: Physician Assistant

## 2022-08-11 DIAGNOSIS — J45901 Unspecified asthma with (acute) exacerbation: Secondary | ICD-10-CM

## 2022-08-11 DIAGNOSIS — J4530 Mild persistent asthma, uncomplicated: Secondary | ICD-10-CM

## 2022-08-11 MED ORDER — AZITHROMYCIN 250 MG PO TABS: 250.0000 mg | ORAL_TABLET | Freq: Every day | ORAL | 0 refills | Status: DC

## 2022-08-11 MED ORDER — PREDNISONE 20 MG PO TABS: 40.0000 mg | ORAL_TABLET | Freq: Every day | ORAL | 0 refills | Status: AC

## 2022-08-11 NOTE — ED Provider Notes (Signed)
[name]    CSN: [id number] Arrival date & time: 08/11/22  0803      History   Chief Complaint Chief Complaint  Patient presents with   Shortness of Breath    HPI Joyce Welch is a 51 y.o. female.   Patient here today for suspected asthma exacerbation.  She reports that since yesterday she has had increasing shortness of breath and wheezing.  She notes that she feels a crackly feeling in her left lung area.  She has tried using her inhaler nebulizer without significant improvement.  She denies any fever.  She has not had any body aches.  She denies sore throat.  The history is provided by the patient.    Past Medical History:  Diagnosis Date   Asthma    Chronic cough    COVID-19 09/18/2021   Fibroids    GERD (gastroesophageal reflux disease)    Hypertension    OAB (overactive bladder)    UTI (urinary tract infection)     Patient Active Problem List   Diagnosis Date Noted   Allergic conjunctivitis of both eyes 02/22/2022   Perennial allergic rhinitis 02/22/2022   Gastroesophageal reflux disease 02/22/2022   Mild persistent asthma without complication 83/38/2505   Chest pain of uncertain etiology 39/76/7341   Heart murmur 12/06/2021   Paresthesia 10/17/2018   Weakness 10/17/2018   Chronic throat clearing 06/20/2018   Post-nasal drip 06/20/2018   Globus sensation 06/20/2018   Asthma exacerbation 10/31/2017   Leukocytosis 10/31/2017   Strain of quadriceps tendon 02/14/2014   Symptomatic menopausal or female climacteric states 07/31/2013    Past Surgical History:  Procedure Laterality Date   ABDOMINAL HYSTERECTOMY  02/05/2006   partial   [place], 2002, 2005,    x 3   MAXILLARY ANTROSTOMY Right 10/07/2018   Procedure: RIGHT MAXILLARY ANTROSTOMY WITH TISSUE REMOVAL.;  Surgeon: Melida Quitter, MD;  Location: [facility];  Service: ENT;  Laterality: Right;    OB History     Gravida  3   Para  3   Term  1    Preterm      AB      Living  3      SAB      IAB      Ectopic      Multiple      Live Births  3            Home Medications    Prior to Admission medications   Medication Sig Start Date End Date Taking? Authorizing Provider  azithromycin (ZITHROMAX) 250 MG tablet Take 1 tablet (250 mg total) by mouth daily. Take first 2 tablets together, then 1 every day until finished. 08/11/22  Yes Francene Finders, PA-C  predniSONE (DELTASONE) 20 MG tablet Take 2 tablets (40 mg total) by mouth daily with breakfast for 5 days. 08/11/22 08/16/22 Yes Francene Finders, PA-C  albuterol (PROVENTIL) (2.5 MG/3ML) 0.083% nebulizer solution Take 3 mLs (2.5 mg total) by nebulization every 6 (six) hours as needed for wheezing or shortness of breath. 10/28/20   Jaynee Eagles, PA-C  albuterol (PROVENTIL) (2.5 MG/3ML) 0.083% nebulizer solution Take 3 mLs (2.5 mg total) by nebulization every 6 (six) hours as needed for wheezing or shortness of breath. 03/31/22   Teodora Medici, FNP  albuterol (VENTOLIN HFA) 108 (90 Base) MCG/ACT inhaler Inhale 1 puff into the lungs every 6 (six) hours as needed for wheezing or shortness of breath. 10/28/20  Jaynee Eagles, PA-C  amLODipine (NORVASC) 5 MG tablet Take 5 mg by mouth daily.    [provider]  azelastine (ASTELIN) 0.1 % nasal spray Place 1 spray into both nostrils 2 (two) times daily. Use in each nostril as directed Patient taking differently: Place 1 spray into both nostrils See admin instructions. Instill 1 spray into each nostril two times a day as directed 06/04/18   [name], MD  cephALEXin (KEFLEX) 500 MG capsule Take 1 capsule (500 mg total) by mouth 2 (two) times daily. 07/13/22   Vanessa Kick, MD  cromolyn (OPTICROM) 4 % ophthalmic solution Place 1 drop into both eyes 4 (four) times daily as needed. 05/31/22   Clemon Chambers, MD  diclofenac Sodium (VOLTAREN) 1 % GEL Apply topically 4 (four) times daily.    [provider]  ipratropium  (ATROVENT) 0.03 % nasal spray Place 2 sprays into both nostrils 3 (three) times daily. 12/28/21   Clemon Chambers, MD  mometasone-formoterol [facility]) 100-5 MCG/ACT AERO Inhale 2 puffs into the lungs 2 (two) times daily. 05/31/22   Clemon Chambers, MD  montelukast (SINGULAIR) 10 MG tablet Take 1 tablet (10 mg total) by mouth at bedtime. 02/22/22   Clemon Chambers, MD  oxybutynin (DITROPAN) 5 MG tablet Take 5 mg by mouth 2 (two) times daily. 11/07/21   [provider]  pantoprazole (PROTONIX) 40 MG tablet Take 1 tablet (40 mg total) by mouth 2 (two) times daily. 02/22/22   Clemon Chambers, MD  Vitamin D, Ergocalciferol, (DRISDOL) 1.25 MG (50000 UT) CAPS capsule Take 50,000 Units by mouth once a week. 05/16/19   [provider]    Family History Family History  Problem Relation Age of Onset   Hypertension Mother    Other Father        unsure of medical history   Breast cancer Maternal Aunt    Allergic rhinitis Neg Hx    Angioedema Neg Hx    Asthma Neg Hx    Atopy Neg Hx    Eczema Neg Hx    Immunodeficiency Neg Hx    Urticaria Neg Hx     Social History Social History   Tobacco Use   Smoking status: Never    Passive exposure: Never   Smokeless tobacco: Never  Vaping Use   Vaping Use: Never used  Substance Use Topics   Alcohol use: Never    Comment: socially   Drug use: Never     Allergies   Patient has no known allergies.   Review of Systems Review of Systems  Constitutional:  Negative for chills and fever.  HENT:  Negative for congestion and sore throat.   Eyes:  Negative for discharge and redness.  Respiratory:  Positive for cough and shortness of breath.   Gastrointestinal:  Negative for nausea and vomiting.     Physical Exam Triage Vital Signs ED Triage Vitals  Enc Vitals Group     BP      Pulse      Resp      Temp      Temp src      SpO2      Weight      Height      Head Circumference      Peak Flow      Pain Score      Pain Loc      Pain  Edu?      Excl. in [place]?    No  data found.  Updated Vital Signs BP 104/65   Pulse 96   Temp 98.9 F (37.2 C)   Resp 18   SpO2 98%     Physical Exam Vitals and nursing note reviewed.  Constitutional:      General: She is not in acute distress.    Appearance: Normal appearance. She is not ill-appearing.  HENT:     Head: Normocephalic and atraumatic.     Nose: No congestion or rhinorrhea.     Mouth/Throat:     Mouth: Mucous membranes are moist.     Pharynx: Oropharynx is clear. No oropharyngeal exudate or posterior oropharyngeal erythema.     Comments: PND noted Eyes:     Conjunctiva/sclera: Conjunctivae normal.  Cardiovascular:     Rate and Rhythm: Normal rate and regular rhythm.  Pulmonary:     Effort: Pulmonary effort is normal. No respiratory distress.     Breath sounds: Wheezing (rare scattered wheeze- more notable to left) present. No rhonchi or rales.  Neurological:     Mental Status: She is alert.  Psychiatric:        Mood and Affect: Mood normal.        Behavior: Behavior normal.        Thought Content: Thought content normal.      UC Treatments / Results  Labs (all labs ordered are listed, but only abnormal results are displayed) Labs Reviewed - No data to display  EKG   Radiology No results found.  Procedures Procedures (including critical care time)  Medications Ordered in UC Medications - No data to display  Initial Impression / Assessment and Plan / UC Course  I have reviewed the triage vital signs and the nursing notes.  Pertinent labs & imaging results that were available during my care of the patient were reviewed by me and considered in my medical decision making (see chart for details).    Steroid burst and Z-Pak prescribed for suspected asthma exacerbation. Recommended albuterol as needed. Offered covid screening- patient declines. Low suspicion for flu given lack of fever.  Encouraged follow-up if no gradual improvement or with any  further concerns. Patient expresses understanding.   Final Clinical Impressions(s) / UC Diagnoses   Final diagnoses:  Mild asthma with acute exacerbation, unspecified whether persistent  Mild persistent asthma without complication   Discharge Instructions   None    ED Prescriptions     Medication Sig Dispense Auth. Provider   predniSONE (DELTASONE) 20 MG tablet Take 2 tablets (40 mg total) by mouth daily with breakfast for 5 days. 10 tablet Ewell Poe F, PA-C   azithromycin (ZITHROMAX) 250 MG tablet Take 1 tablet (250 mg total) by mouth daily. Take first 2 tablets together, then 1 every day until finished. 6 tablet Francene Finders, PA-C      PDMP not reviewed this encounter.   Francene Finders, PA-C 08/11/22 [phone]

## 2022-08-11 NOTE — ED Triage Notes (Signed)
Pt presents to uc with co of asthma flare up. Pt reports she has been taking her inhaler with minimal improvement

## 2022-08-13 ENCOUNTER — Emergency Department (HOSPITAL_BASED_OUTPATIENT_CLINIC_OR_DEPARTMENT_OTHER)
Admission: EM | Admit: 2022-08-13 | Discharge: 2022-08-13 | Disposition: A | Discharge status: Home / Self Care | Payer: Medicaid Other | Attending: Emergency Medicine | Admitting: Emergency Medicine

## 2022-08-13 ENCOUNTER — Emergency Department (HOSPITAL_BASED_OUTPATIENT_CLINIC_OR_DEPARTMENT_OTHER): Payer: Medicaid Other

## 2022-08-13 ENCOUNTER — Ambulatory Visit: Admission: EM | Admit: 2022-08-13 | Discharge: 2022-08-13 | Payer: Medicaid Other

## 2022-08-13 ENCOUNTER — Other Ambulatory Visit: Payer: Self-pay

## 2022-08-13 DIAGNOSIS — R000 Tachycardia, unspecified: Secondary | ICD-10-CM | POA: Insufficient documentation

## 2022-08-13 DIAGNOSIS — J45901 Unspecified asthma with (acute) exacerbation: Secondary | ICD-10-CM | POA: Diagnosis not present

## 2022-08-13 DIAGNOSIS — Z1152 Encounter for screening for COVID-19: Secondary | ICD-10-CM | POA: Diagnosis not present

## 2022-08-13 DIAGNOSIS — R051 Acute cough: Secondary | ICD-10-CM | POA: Diagnosis present

## 2022-08-13 DIAGNOSIS — Z79899 Other long term (current) drug therapy: Secondary | ICD-10-CM | POA: Diagnosis not present

## 2022-08-13 DIAGNOSIS — Z7951 Long term (current) use of inhaled steroids: Secondary | ICD-10-CM | POA: Diagnosis not present

## 2022-08-13 DIAGNOSIS — R062 Wheezing: Secondary | ICD-10-CM | POA: Insufficient documentation

## 2022-08-13 LAB — CBC
HCT: 39 % (ref 36.0–46.0)
Hemoglobin: 12.2 g/dL (ref 12.0–15.0)
MCH: 25.1 pg — ABNORMAL LOW (ref 26.0–34.0)
MCHC: 31.3 g/dL (ref 30.0–36.0)
MCV: 80.2 fL (ref 80.0–100.0)
Platelets: 259 10*3/uL (ref 150–400)
RBC: 4.86 MIL/uL (ref 3.87–5.11)
RDW: 13.1 % (ref 11.5–15.5)
WBC: 8.8 10*3/uL (ref 4.0–10.5)
nRBC: 0 % (ref 0.0–0.2)

## 2022-08-13 LAB — BASIC METABOLIC PANEL
Anion gap: 5 (ref 5–15)
BUN: 11 mg/dL (ref 6–20)
CO2: 28 mmol/L (ref 22–32)
Calcium: 8.5 mg/dL — ABNORMAL LOW (ref 8.9–10.3)
Chloride: 107 mmol/L (ref 98–111)
Creatinine, Ser: 0.85 mg/dL (ref 0.44–1.00)
GFR, Estimated: >60 mL/min (ref >60)
Glucose, Bld: 87 mg/dL (ref 70–99)
Potassium: 3.7 mmol/L (ref 3.5–5.1)
Sodium: 140 mmol/L (ref 135–145)

## 2022-08-13 LAB — RESP PANEL BY RT-PCR (FLU A&B, COVID) ARPGX2
Influenza A by PCR: NEGATIVE
Influenza B by PCR: NEGATIVE
SARS Coronavirus 2 by RT PCR: NEGATIVE

## 2022-08-13 MED ORDER — HYDROCOD POLI-CHLORPHE POLI ER 10-8 MG/5ML PO SUER: 5.0000 mL | Freq: Two times a day (BID) | ORAL | 0 refills | Status: DC

## 2022-08-13 NOTE — ED Provider Notes (Signed)
[name]    CSN: [id number] Arrival date & time: 08/13/22  1508      History   Chief Complaint Chief Complaint  Patient presents with   Follow-up    HPI Joyce Welch is a 51 y.o. female.   Patient here today with worsening shortness of breath, chest congestion after being diagnosed with asthma exacerbation a few days ago. She reports she has been taking medications as prescribed but is not improving.   The history is provided by the patient.    Past Medical History:  Diagnosis Date   Asthma    Chronic cough    COVID-19 09/18/2021   Fibroids    GERD (gastroesophageal reflux disease)    Hypertension    OAB (overactive bladder)    UTI (urinary tract infection)     Patient Active Problem List   Diagnosis Date Noted   Allergic conjunctivitis of both eyes 02/22/2022   Perennial allergic rhinitis 02/22/2022   Gastroesophageal reflux disease 02/22/2022   Mild persistent asthma without complication 78/29/5621   Chest pain of uncertain etiology 30/86/5784   Heart murmur 12/06/2021   Paresthesia 10/17/2018   Weakness 10/17/2018   Chronic throat clearing 06/20/2018   Post-nasal drip 06/20/2018   Globus sensation 06/20/2018   Asthma exacerbation 10/31/2017   Leukocytosis 10/31/2017   Strain of quadriceps tendon 02/14/2014   Symptomatic menopausal or female climacteric states 07/31/2013    Past Surgical History:  Procedure Laterality Date   ABDOMINAL HYSTERECTOMY  02/05/2006   partial   [place], 2002, 2005,    x 3   MAXILLARY ANTROSTOMY Right 10/07/2018   Procedure: RIGHT MAXILLARY ANTROSTOMY WITH TISSUE REMOVAL.;  Surgeon: Melida Quitter, MD;  Location: [facility];  Service: ENT;  Laterality: Right;    OB History     Gravida  3   Para  3   Term  1   Preterm      AB      Living  3      SAB      IAB      Ectopic      Multiple      Live Births  3            Home Medications    Prior to  Admission medications   Medication Sig Start Date End Date Taking? Authorizing Provider  albuterol (PROVENTIL) (2.5 MG/3ML) 0.083% nebulizer solution Take 3 mLs (2.5 mg total) by nebulization every 6 (six) hours as needed for wheezing or shortness of breath. 10/28/20   Jaynee Eagles, PA-C  albuterol (PROVENTIL) (2.5 MG/3ML) 0.083% nebulizer solution Take 3 mLs (2.5 mg total) by nebulization every 6 (six) hours as needed for wheezing or shortness of breath. 03/31/22   Teodora Medici, FNP  albuterol (VENTOLIN HFA) 108 (90 Base) MCG/ACT inhaler Inhale 1 puff into the lungs every 6 (six) hours as needed for wheezing or shortness of breath. 10/28/20   Jaynee Eagles, PA-C  amLODipine (NORVASC) 5 MG tablet Take 5 mg by mouth daily.    [provider]  azelastine (ASTELIN) 0.1 % nasal spray Place 1 spray into both nostrils 2 (two) times daily. Use in each nostril as directed Patient taking differently: Place 1 spray into both nostrils See admin instructions. Instill 1 spray into each nostril two times a day as directed 06/04/18   [name], MD  azithromycin (ZITHROMAX) 250 MG tablet Take 1 tablet (250 mg total) by mouth daily. Take first  2 tablets together, then 1 every day until finished. 08/11/22   Francene Finders, PA-C  cephALEXin (KEFLEX) 500 MG capsule Take 1 capsule (500 mg total) by mouth 2 (two) times daily. 07/13/22   Vanessa Kick, MD  cromolyn (OPTICROM) 4 % ophthalmic solution Place 1 drop into both eyes 4 (four) times daily as needed. 05/31/22   Clemon Chambers, MD  diclofenac Sodium (VOLTAREN) 1 % GEL Apply topically 4 (four) times daily.    [provider]  ipratropium (ATROVENT) 0.03 % nasal spray Place 2 sprays into both nostrils 3 (three) times daily. 12/28/21   Clemon Chambers, MD  mometasone-formoterol [facility]) 100-5 MCG/ACT AERO Inhale 2 puffs into the lungs 2 (two) times daily. 05/31/22   Clemon Chambers, MD  montelukast (SINGULAIR) 10 MG tablet Take 1 tablet (10 mg total) by mouth  at bedtime. 02/22/22   Clemon Chambers, MD  oxybutynin (DITROPAN) 5 MG tablet Take 5 mg by mouth 2 (two) times daily. 11/07/21   [provider]  pantoprazole (PROTONIX) 40 MG tablet Take 1 tablet (40 mg total) by mouth 2 (two) times daily. 02/22/22   Clemon Chambers, MD  predniSONE (DELTASONE) 20 MG tablet Take 2 tablets (40 mg total) by mouth daily with breakfast for 5 days. 08/11/22 08/16/22  Francene Finders, PA-C  Vitamin D, Ergocalciferol, (DRISDOL) 1.25 MG (50000 UT) CAPS capsule Take 50,000 Units by mouth once a week. 05/16/19   [provider]    Family History Family History  Problem Relation Age of Onset   Hypertension Mother    Other Father        unsure of medical history   Breast cancer Maternal Aunt    Allergic rhinitis Neg Hx    Angioedema Neg Hx    Asthma Neg Hx    Atopy Neg Hx    Eczema Neg Hx    Immunodeficiency Neg Hx    Urticaria Neg Hx     Social History Social History   Tobacco Use   Smoking status: Never    Passive exposure: Never   Smokeless tobacco: Never  Vaping Use   Vaping Use: Never used  Substance Use Topics   Alcohol use: Never    Comment: socially   Drug use: Never     Allergies   Patient has no known allergies.   Review of Systems Review of Systems  Constitutional:  Negative for chills and fever.  HENT:  Positive for congestion.   Eyes:  Negative for discharge and redness.  Respiratory:  Positive for cough, shortness of breath and wheezing.   Gastrointestinal:  Negative for vomiting.     Physical Exam Triage Vital Signs ED Triage Vitals  Enc Vitals Group     BP 08/13/22 1521 135/83     Pulse Rate 08/13/22 1521 (!) 103     Resp 08/13/22 1521 (!) 22     Temp 08/13/22 1521 98.3 F (36.8 C)     Temp Source 08/13/22 1521 Oral     SpO2 08/13/22 1521 98 %     Weight --      Height --      Head Circumference --      Peak Flow --      Pain Score 08/13/22 1524 6     Pain Loc --      Pain Edu? --      Excl. in  [place]? --    No data found.  Updated Vital Signs BP  135/83 (BP Location: Right Arm)   Pulse (!) 103   Temp 98.3 F (36.8 C) (Oral)   Resp (!) 22   SpO2 98%   Visual Acuity Right Eye Distance:   Left Eye Distance:   Bilateral Distance:    Right Eye Near:   Left Eye Near:    Bilateral Near:     Physical Exam Vitals and nursing note reviewed.  Constitutional:      Appearance: Normal appearance. She is not ill-appearing.     Comments: Increased respiratory rate noted  HENT:     Head: Normocephalic and atraumatic.     Nose: Congestion present.  Eyes:     Conjunctiva/sclera: Conjunctivae normal.  Cardiovascular:     Rate and Rhythm: Regular rhythm. Tachycardia present.  Pulmonary:     Breath sounds: Normal breath sounds. No wheezing, rhonchi or rales.  Neurological:     Mental Status: She is alert.      UC Treatments / Results  Labs (all labs ordered are listed, but only abnormal results are displayed) Labs Reviewed - No data to display  EKG   Radiology No results found.  Procedures Procedures (including critical care time)  Medications Ordered in UC Medications - No data to display  Initial Impression / Assessment and Plan / UC Course  I have reviewed the triage vital signs and the nursing notes.  Pertinent labs & imaging results that were available during my care of the patient were reviewed by me and considered in my medical decision making (see chart for details).    Given reported worsening symptoms with increased HR, RR despite outpatient treatment, recommended further evaluation in the ED for further evaluation, imaging, etc.  Patient is agreeable. Husband will drive her via POV.   Final Clinical Impressions(s) / UC Diagnoses   Final diagnoses:  Asthma with acute exacerbation, unspecified asthma severity, unspecified whether persistent     Discharge Instructions       Please report to [facility]  [place],  [place]    ED Prescriptions   None    PDMP not reviewed this encounter.   Francene Finders, PA-C 08/13/22 1534

## 2022-08-13 NOTE — ED Triage Notes (Signed)
Pt presents for follow up for ongoing non productive cough that causes chest discomfort, shortness of breath, and congestion that has been unrelieved with nebulizer & prescribed medication.

## 2022-08-13 NOTE — ED Provider Notes (Signed)
[facility] Provider Note   CSN: [id number] Arrival date & time: 08/13/22  1607     History  Chief Complaint  Patient presents with   Asthma    Joyce Welch is a 51 y.o. female 51 year old female with past medical history significant for asthma who presents with concern for ongoing wheezing, cough, upper respiratory symptoms after being diagnosed with an asthma exacerbation, placed on Z-Pak, steroids, and using home inhalers.  Symptoms started around 3 days ago.  She arrives tachycardic but had been using albuterol just prior to arrival.  She has stable oxygenation on room air and with ambulation.   Asthma Associated symptoms include shortness of breath.       Home Medications Prior to Admission medications   Medication Sig Start Date End Date Taking? Authorizing Provider  albuterol (PROVENTIL) (2.5 MG/3ML) 0.083% nebulizer solution Take 3 mLs (2.5 mg total) by nebulization every 6 (six) hours as needed for wheezing or shortness of breath. 10/28/20  Yes Jaynee Eagles, PA-C  albuterol (VENTOLIN HFA) 108 (90 Base) MCG/ACT inhaler Inhale 1 puff into the lungs every 6 (six) hours as needed for wheezing or shortness of breath. 10/28/20  Yes Jaynee Eagles, PA-C  amLODipine (NORVASC) 5 MG tablet Take 5 mg by mouth daily.   Yes [provider]  azelastine (ASTELIN) 0.1 % nasal spray Place 1 spray into both nostrils 2 (two) times daily. Use in each nostril as directed Patient taking differently: Place 1 spray into both nostrils See admin instructions. Instill 1 spray into each nostril two times a day as directed 06/04/18  Yes Sood, Elisabeth Cara, MD  azithromycin (ZITHROMAX) 250 MG tablet Take 1 tablet (250 mg total) by mouth daily. Take first 2 tablets together, then 1 every day until finished. 08/11/22  Yes Francene Finders, PA-C  chlorpheniramine-HYDROcodone (TUSSIONEX) 10-8 MG/5ML Take 5 mLs by mouth 2 (two) times daily. 08/13/22  Yes Ivyonna Hoelzel  H, PA-C  ipratropium (ATROVENT) 0.03 % nasal spray Place 2 sprays into both nostrils 3 (three) times daily. 12/28/21  Yes Clemon Chambers, MD  loratadine (CLARITIN) 10 MG tablet Take 10 mg by mouth daily.   Yes [provider]  mometasone-formoterol (DULERA) 100-5 MCG/ACT AERO Inhale 2 puffs into the lungs 2 (two) times daily. 05/31/22  Yes Clemon Chambers, MD  oxybutynin (DITROPAN) 5 MG tablet Take 5 mg by mouth 2 (two) times daily. 11/07/21  Yes [provider]  pantoprazole (PROTONIX) 40 MG tablet Take 1 tablet (40 mg total) by mouth 2 (two) times daily. 02/22/22  Yes Clemon Chambers, MD  predniSONE (DELTASONE) 20 MG tablet Take 2 tablets (40 mg total) by mouth daily with breakfast for 5 days. 08/11/22 08/16/22 Yes Francene Finders, PA-C  Vitamin D, Ergocalciferol, (DRISDOL) 1.25 MG (50000 UT) CAPS capsule Take 50,000 Units by mouth once a week. 05/16/19  Yes [provider]  albuterol (PROVENTIL) (2.5 MG/3ML) 0.083% nebulizer solution Take 3 mLs (2.5 mg total) by nebulization every 6 (six) hours as needed for wheezing or shortness of breath. 03/31/22   Teodora Medici, FNP  cephALEXin (KEFLEX) 500 MG capsule Take 1 capsule (500 mg total) by mouth 2 (two) times daily. 07/13/22   Vanessa Kick, MD  cromolyn (OPTICROM) 4 % ophthalmic solution Place 1 drop into both eyes 4 (four) times daily as needed. 05/31/22   Clemon Chambers, MD  diclofenac Sodium (VOLTAREN) 1 % GEL Apply topically 4 (four) times daily.    [provider]  montelukast (SINGULAIR) 10 MG tablet Take 1 tablet (10 mg total) by mouth at bedtime. 02/22/22   Clemon Chambers, MD      Allergies    Patient has no known allergies.    Review of Systems   Review of Systems  Respiratory:  Positive for shortness of breath.   All other systems reviewed and are negative.   Physical Exam Updated Vital Signs BP 136/83   Pulse 95   Temp 98.5 F (36.9 C) (Oral)   Resp 17   Ht '5\' 6"'$  (1.676 m)   Wt 117.9 kg   SpO2  98%   BMI 41.97 kg/m  Physical Exam Vitals and nursing note reviewed.  Constitutional:      General: She is not in acute distress.    Appearance: Normal appearance.  HENT:     Head: Normocephalic and atraumatic.  Eyes:     General:        Right eye: No discharge.        Left eye: No discharge.  Cardiovascular:     Rate and Rhythm: Regular rhythm. Tachycardia present.     Heart sounds: No murmur heard.    No friction rub. No gallop.     Comments: Tachycardia on arrival likely secondary to patient having ambulated from waiting room, as well as recent frequent albuterol use, normal rhythm, improved on reassessment Pulmonary:     Effort: Pulmonary effort is normal.     Breath sounds: Normal breath sounds.     Comments: Some upper respiratory congestion, and wheezing noted with no lower respiratory wheezing on my exam, no rhonchi, focal consolidation noted.  Patient in no respiratory distress, stable oxygen saturation on room air. Abdominal:     General: Bowel sounds are normal.     Palpations: Abdomen is soft.  Skin:    General: Skin is warm and dry.     Capillary Refill: Capillary refill takes less than 2 seconds.  Neurological:     Mental Status: She is alert and oriented to person, place, and time.  Psychiatric:        Mood and Affect: Mood normal.        Behavior: Behavior normal.     ED Results / Procedures / Treatments   Labs (all labs ordered are listed, but only abnormal results are displayed) Labs Reviewed  CBC - Abnormal; Notable for the following components:      Result Value   MCH 25.1 (*)    All other components within normal limits  BASIC METABOLIC PANEL - Abnormal; Notable for the following components:   Calcium 8.5 (*)    All other components within normal limits  RESP PANEL BY RT-PCR (FLU A&B, COVID) ARPGX2  PREGNANCY, URINE    EKG None  Radiology DG Chest 2 View  Result Date: 08/13/2022 CLINICAL DATA:  shob EXAM: CHEST - 2 VIEW COMPARISON:   Chest x-ray 05/07/2022 FINDINGS: The heart and mediastinal contours are within normal limits. No focal consolidation. Increased interstitial markings. No pleural effusion. No pneumothorax. No acute osseous abnormality. IMPRESSION: Increased interstitial markings with findings suggestive of viral bronchiolitis versus reactive airway disease. No definite findings of pulmonary edema. Electronically Signed   By: Iven Finn M.D.   On: 08/13/2022 17:42    Procedures Procedures    Medications Ordered in ED Medications - No data to display  ED Course/ Medical Decision Making/ A&P Clinical Course as of 08/13/22 1843  Sun Aug 13, 2022  1712  51 year old female with past medical history significant for asthma who presents with concern for ongoing wheezing, cough, upper respiratory symptoms after being diagnosed with an asthma exacerbation, placed on Z-Pak, steroids, and using home inhalers.  Symptoms started around 3 days ago.  She arrives tachycardic but had been using albuterol just prior to arrival.  She has stable oxygenation on room air and with ambulation. [CP]    Clinical Course User Index [CP] Anselmo Pickler, PA-C                           Medical Decision Making Amount and/or Complexity of Data Reviewed Labs: ordered. Radiology: ordered.   This patient is a 51 y.o. female who presents to the ED for concern of shob, ongoing cough in context of recent treatment for asthma exacerbation with possible atypical bacterial infection, this involves an extensive number of treatment options, and is a complaint that carries with it a high risk of complications and morbidity. The emergent differential diagnosis prior to evaluation includes, but is not limited to,  asthma exacerbation, COPD exacerbation, acute upper respiratory infection, acute bronchitis, chronic bronchitis, interstitial lung disease, ARDS, PE, pneumonia, atypical ACS, carbon monoxide poisoning, spontaneous pneumothorax versus  other .   This is not an exhaustive differential.   Past Medical History / Co-morbidities / Social History: Asthma, previous COVID, hypertension  Additional history: Chart reviewed. Pertinent results include: Reviewed lab work, imaging from previous emergency department visits as well as recent previous urgent care visits with urging of patient to come to the emergency department for further evaluation given her ongoing symptoms despite significant treatment with steroid, Z-Pak, at home albuterol  Physical Exam: Physical exam performed. The pertinent findings include: Patient with no lower respiratory wheezing, no focal consolidation, or accessory breath sounds, she does have a significant dry cough, and some upper respiratory wheezing, she is not in any respiratory distress, stable oxygen saturation on room air, she had some tachycardia on arrival which is resolved on repeat evaluation  Lab Tests: I ordered, and personally interpreted labs.  The pertinent results include: CBC unremarkable, notably no leukocytosis even despite recent steroid use, her BMP is unremarkable, RVP negative for COVID, flu.   Imaging Studies: I ordered imaging studies including plain film chest x-ray. I independently visualized and interpreted imaging which showed some inflammatory changes suggestive of bronchiolitis without any evidence of acute pneumonia, focal consolidation, or other abnormality. I agree with the radiologist interpretation.   Medications: I have reviewed the patients home medicines and have made adjustments as needed.  I would not add any additional treatment to her current steroids, Z-Pak, at home albuterol, however given the severity of her cough, and chest discomfort secondary to cough I think would be reasonable to give her some Tussionex to help with the symptoms as she continues to overcome asthma exacerbation of likely viral origin   Disposition: After consideration of the diagnostic results  and the patients response to treatment, I feel that patient stable for discharge with ongoing asthma exacerbation but without any wheezing, respiratory distress on my exam, encouraged her to continue her at home medications and will discharge with Tussionex.   emergency department workup does not suggest an emergent condition requiring admission or immediate intervention beyond what has been performed at this time. The plan is: as above. The patient is safe for discharge and has been instructed to return immediately for worsening symptoms, change in symptoms or any other concerns.  I discussed this case with my attending physician Dr. Billy Fischer who cosigned this note including patient's presenting symptoms, physical exam, and planned diagnostics and interventions. Attending physician stated agreement with plan or made changes to plan which were implemented.    Final Clinical Impression(s) / ED Diagnoses Final diagnoses:  Wheezing  Acute cough    Rx / DC Orders ED Discharge Orders          Ordered    chlorpheniramine-HYDROcodone (TUSSIONEX) 10-8 MG/5ML  2 times daily        08/13/22 1822              Anselmo Pickler, [place] 08/13/22 1843    Gareth Morgan, MD 08/14/22 1321

## 2022-08-13 NOTE — Discharge Instructions (Signed)
  Please report to [facility]  [place], [place]

## 2022-08-13 NOTE — ED Triage Notes (Signed)
Reported was advised by UC to come to ED for further eval; stated on ABX, steroids and inhaler and nebs since Thursday and feels no improvement;

## 2022-08-13 NOTE — Discharge Instructions (Signed)
Continue taking the medications that you are already prescribed for your asthma exacerbation, at this time I am not seeing any significant wheezing or worsening of your symptoms, your lab work, imaging does not suggest any developing pneumonia.  I prescribed some medicated cough medicine to help with your coughing and chest discomfort related to coughing.

## 2022-08-13 NOTE — ED Notes (Signed)
Patient is being discharged from the Urgent Care and sent to the Emergency Department via personal vehicle . Per Provider Ewell Poe, patient is in need of higher level of care due to asthma severity. Patient is aware and verbalizes understanding of plan of care.   Vitals:   08/13/22 1521  BP: 135/83  Pulse: (!) 103  Resp: (!) 22  Temp: 98.3 F (36.8 C)  SpO2: 98%

## 2022-09-22 DIAGNOSIS — M47816 Spondylosis without myelopathy or radiculopathy, lumbar region: Secondary | ICD-10-CM | POA: Insufficient documentation

## 2022-09-22 DIAGNOSIS — M792 Neuralgia and neuritis, unspecified: Secondary | ICD-10-CM | POA: Insufficient documentation

## 2022-10-04 ENCOUNTER — Ambulatory Visit (INDEPENDENT_AMBULATORY_CARE_PROVIDER_SITE_OTHER): Payer: Medicaid Other | Admitting: Emergency Medicine

## 2022-10-04 ENCOUNTER — Encounter: Payer: Self-pay | Admitting: Emergency Medicine

## 2022-10-04 VITALS — BP 126/72 | HR 86 | Temp 98.2°F | Ht 66.0 in | Wt 245.0 lb

## 2022-10-04 DIAGNOSIS — R911 Solitary pulmonary nodule: Secondary | ICD-10-CM

## 2022-10-04 DIAGNOSIS — J4530 Mild persistent asthma, uncomplicated: Secondary | ICD-10-CM | POA: Diagnosis not present

## 2022-10-04 NOTE — Assessment & Plan Note (Signed)
Overall stable although she did have an exacerbation characterized mainly by cough in late 2023.  Continue her current inhaled regimen.

## 2022-10-04 NOTE — Assessment & Plan Note (Signed)
7 mm left lower lobe pulmonary nodule associated with vasculature.  In retrospect I can see the nodule going back to 11/26/2021.  I do not see it on a more remote scan from 07/2018.  No interval change over 10 months is reassuring.  She needs 2 years of total stability.  We will plan to repeat the CT chest in February 2025.  We will put reminders in the system so we are sure to get the scan at that time, arrange for appropriate follow-up.

## 2022-10-04 NOTE — Patient Instructions (Addendum)
We reviewed your CT scans of the chest today.  There is a small pulmonary nodule in the left lower lobe that we will need to be followed.  It has been stable for 10 months.  You need a repeat CT scan of your chest in February 2025 to confirm 2 years of total stability. Continue your inhaler medications as you have been taking them Follow with Dr. Lamonte Sakai in February 2025 after your CT scan so we can review the results together.

## 2022-10-04 NOTE — Progress Notes (Signed)
Subjective:    Patient ID: Joyce Welch, female    DOB: Apr 16, 1971, 52 y.o.   MRN: [id number]  HPI 52 year old never smoker who has been followed in our office for chronic cough and probable asthma.  She has rhinitis and laryngopharyngeal reflux.  She had COVID-19 in 2020.  She is referred today for an abnormal CT scan of the chest.  She reports that a pulmonary nodule was seen on an MRI of the spine that prompted a dedicated CT scan of the chest at [facility] 09/08/2022 Today she reports that she is doing well. She is on [place]. Her last flare was 11/23, required abx and pred. She uses her albuterol about few times a week. She has exertional SOB. Can also get dyspnea when she is supine. Minimal cough at this time.   CT scan of the chest with contrast 09/08/2022 shows an isolated solid 7 mm left lower lobe pulmonary nodule associated with pulmonary vasculature.  No other nodule seen.  The nodule is stable in appearance and size compared with coronary CT from March/2023 and a CT-PA done 11/26/2021.  Nodules not seen on a CT chest done 07/29/2018   Review of Systems As per HPI  Past Medical History:  Diagnosis Date   Asthma    Chronic cough    COVID-19 09/18/2021   Fibroids    GERD (gastroesophageal reflux disease)    Hypertension    OAB (overactive bladder)    UTI (urinary tract infection)      Family History  Problem Relation Age of Onset   Hypertension Mother    Other Father        unsure of medical history   Breast cancer Maternal Aunt    Allergic rhinitis Neg Hx    Angioedema Neg Hx    Asthma Neg Hx    Atopy Neg Hx    Eczema Neg Hx    Immunodeficiency Neg Hx    Urticaria Neg Hx     No family hx lung CA  Social History   Socioeconomic History   Marital status: Single    Spouse name: Not on file   Number of children: 3   Years of education: 12   Highest education level: High school graduate  Occupational History   Occupation: Mudlogger   Tobacco Use    Smoking status: Never    Passive exposure: Never   Smokeless tobacco: Never  Vaping Use   Vaping Use: Never used  Substance and Sexual Activity   Alcohol use: Never    Comment: socially   Drug use: Never   Sexual activity: Yes    Partners: Male    Birth control/protection: Surgical  Other Topics Concern   Not on file  Social History Narrative   Lives at home with her daughter.   Right-handed.   Caffeine use:  3 cups daily.   Social Determinants of Health   Financial Resource Strain: Not on file  Food Insecurity: Not on file  Transportation Needs: Not on file  Physical Activity: Not on file  Stress: Not on file  Social Connections: Not on file  Intimate Partner Violence: Not on file     No Known Allergies   Outpatient Medications Prior to Visit  Medication Sig Dispense Refill   albuterol (PROVENTIL) (2.5 MG/3ML) 0.083% nebulizer solution Take 3 mLs (2.5 mg total) by nebulization every 6 (six) hours as needed for wheezing or shortness of breath. 75 mL 0   albuterol (VENTOLIN HFA) 108 (90 Base)  MCG/ACT inhaler Inhale 1 puff into the lungs every 6 (six) hours as needed for wheezing or shortness of breath. 18 g 0   amLODipine (NORVASC) 5 MG tablet Take 5 mg by mouth daily.     azelastine (ASTELIN) 0.1 % nasal spray Place 1 spray into both nostrils 2 (two) times daily. Use in each nostril as directed (Patient taking differently: Place 1 spray into both nostrils See admin instructions. Instill 1 spray into each nostril two times a day as directed) 30 mL 12   diclofenac Sodium (VOLTAREN) 1 % GEL Apply topically 4 (four) times daily.     gabapentin (NEURONTIN) 300 MG capsule 1 PO HS x 3 days then increase to BID x 3 days, then increase to TID     ipratropium (ATROVENT) 0.03 % nasal spray Place 2 sprays into both nostrils 3 (three) times daily. 30 mL 12   loratadine (CLARITIN) 10 MG tablet Take 10 mg by mouth daily.     mometasone-formoterol (DULERA) 100-5 MCG/ACT AERO Inhale 2  puffs into the lungs 2 (two) times daily. 13 g 4   oxybutynin (DITROPAN) 5 MG tablet Take 5 mg by mouth 2 (two) times daily.     pantoprazole (PROTONIX) 40 MG tablet Take 1 tablet (40 mg total) by mouth 2 (two) times daily. 60 tablet 3   Vitamin D, Ergocalciferol, (DRISDOL) 1.25 MG (50000 UT) CAPS capsule Take 50,000 Units by mouth once a week.     albuterol (PROVENTIL) (2.5 MG/3ML) 0.083% nebulizer solution Take 3 mLs (2.5 mg total) by nebulization every 6 (six) hours as needed for wheezing or shortness of breath. (Patient not taking: Reported on 10/04/2022) 75 mL 0   azithromycin (ZITHROMAX) 250 MG tablet Take 1 tablet (250 mg total) by mouth daily. Take first 2 tablets together, then 1 every day until finished. (Patient not taking: Reported on 10/04/2022) 6 tablet 0   cephALEXin (KEFLEX) 500 MG capsule Take 1 capsule (500 mg total) by mouth 2 (two) times daily. (Patient not taking: Reported on 10/04/2022) 10 capsule 0   chlorpheniramine-HYDROcodone (TUSSIONEX) 10-8 MG/5ML Take 5 mLs by mouth 2 (two) times daily. (Patient not taking: Reported on 10/04/2022) 70 mL 0   cromolyn (OPTICROM) 4 % ophthalmic solution Place 1 drop into both eyes 4 (four) times daily as needed. (Patient not taking: Reported on 10/04/2022) 10 mL 3   montelukast (SINGULAIR) 10 MG tablet Take 1 tablet (10 mg total) by mouth at bedtime. (Patient not taking: Reported on 10/04/2022) 30 tablet 3   No facility-administered medications prior to visit.        Objective:   Physical Exam Vitals:   10/04/22 1457  BP: 126/72  Pulse: 86  Temp: 98.2 F (36.8 C)  TempSrc: Oral  SpO2: 99%  Weight: 245 lb (111.1 kg)  Height: '5\' 6"'$  (1.676 m)    Gen: Pleasant, overwt, in no distress,  normal affect  ENT: No lesions,  mouth clear,  oropharynx clear, no postnasal drip  Neck: No JVD, no stridor  Lungs: No use of accessory muscles, somewhat small breaths, no crackles or wheezing on normal respiration, no wheeze on forced  expiration  Cardiovascular: RRR, heart sounds normal, no murmur or gallops, no peripheral edema  Musculoskeletal: No deformities, no cyanosis or clubbing  Neuro: alert, awake, non focal  Skin: Warm, no lesions or rash      Assessment & Plan:  Pulmonary nodule 7 mm left lower lobe pulmonary nodule associated with vasculature.  In retrospect I can  see the nodule going back to 11/26/2021.  I do not see it on a more remote scan from 07/2018.  No interval change over 10 months is reassuring.  She needs 2 years of total stability.  We will plan to repeat the CT chest in February 2025.  We will put reminders in the system so we are sure to get the scan at that time, arrange for appropriate follow-up.  Mild persistent asthma without complication Overall stable although she did have an exacerbation characterized mainly by cough in late 2023.  Continue her current inhaled regimen.  Baltazar Apo, MD, PhD 10/04/2022, 3:26 PM  Pulmonary and Critical Care [phone] or if no answer before 7:00PM call [phone] For any issues after 7:00PM please call eLink [phone]

## 2022-10-10 ENCOUNTER — Ambulatory Visit
Admission: EM | Admit: 2022-10-10 | Discharge: 2022-10-10 | Disposition: A | Discharge status: Home / Self Care | Payer: Medicaid Other | Attending: Internal Medicine | Admitting: Internal Medicine

## 2022-10-10 ENCOUNTER — Ambulatory Visit (INDEPENDENT_AMBULATORY_CARE_PROVIDER_SITE_OTHER): Payer: Medicaid Other

## 2022-10-10 ENCOUNTER — Encounter: Payer: Self-pay | Admitting: Emergency Medicine

## 2022-10-10 ENCOUNTER — Other Ambulatory Visit: Payer: Self-pay

## 2022-10-10 DIAGNOSIS — M545 Low back pain, unspecified: Secondary | ICD-10-CM

## 2022-10-10 DIAGNOSIS — W19XXXA Unspecified fall, initial encounter: Secondary | ICD-10-CM | POA: Diagnosis not present

## 2022-10-10 DIAGNOSIS — M25552 Pain in left hip: Secondary | ICD-10-CM

## 2022-10-10 NOTE — Discharge Instructions (Signed)
Your x-rays did not show any acute abnormalities.  I recommend that you follow-up with your established orthopedist for further evaluation and management.  I also recommend that you take the muscle relaxer that you have at home as I do think it will be beneficial.

## 2022-10-10 NOTE — ED Triage Notes (Signed)
Pt here for fall 3 days ago with pain in left groin and side since fall

## 2022-10-10 NOTE — ED Provider Notes (Signed)
[name]    CSN: [id number] Arrival date & time: 10/10/22  3329      History   Chief Complaint Chief Complaint  Patient presents with   Fall    HPI Joyce Welch is a 52 y.o. female.   Patient presents for left hip and lower back pain that started after a fall that occurred 3 days ago.  Patient reports that she tripped over a bag and fell forward.  She denies hitting head or losing consciousness.  She is not exactly sure how she fell but reports that she started having some left hip pain that radiates into the groin and some left lower back pain.  She has not taken any medication for pain.  Denies numbness or tingling.  Reports pain mainly occurs when she stands up.   Fall    Past Medical History:  Diagnosis Date   Asthma    Chronic cough    COVID-19 09/18/2021   Fibroids    GERD (gastroesophageal reflux disease)    Hypertension    OAB (overactive bladder)    UTI (urinary tract infection)     Patient Active Problem List   Diagnosis Date Noted   Pulmonary nodule 10/04/2022   Allergic conjunctivitis of both eyes 02/22/2022   Perennial allergic rhinitis 02/22/2022   Gastroesophageal reflux disease 02/22/2022   Mild persistent asthma without complication 51/88/4166   Chest pain of uncertain etiology 03/31/1600   Heart murmur 12/06/2021   Paresthesia 10/17/2018   Weakness 10/17/2018   Chronic throat clearing 06/20/2018   Post-nasal drip 06/20/2018   Globus sensation 06/20/2018   Asthma exacerbation 10/31/2017   Leukocytosis 10/31/2017   Strain of quadriceps tendon 02/14/2014   Symptomatic menopausal or female climacteric states 07/31/2013    Past Surgical History:  Procedure Laterality Date   ABDOMINAL HYSTERECTOMY  02/05/2006   partial   [place], 2002, 2005,    x 3   MAXILLARY ANTROSTOMY Right 10/07/2018   Procedure: RIGHT MAXILLARY ANTROSTOMY WITH TISSUE REMOVAL.;  Surgeon: Melida Quitter, MD;  Location: [facility];  Service: ENT;  Laterality: Right;    OB History     Gravida  3   Para  3   Term  1   Preterm      AB      Living  3      SAB      IAB      Ectopic      Multiple      Live Births  3            Home Medications    Prior to Admission medications   Medication Sig Start Date End Date Taking? Authorizing Provider  albuterol (PROVENTIL) (2.5 MG/3ML) 0.083% nebulizer solution Take 3 mLs (2.5 mg total) by nebulization every 6 (six) hours as needed for wheezing or shortness of breath. Patient not taking: Reported on 10/04/2022 10/28/20   Jaynee Eagles, PA-C  albuterol (PROVENTIL) (2.5 MG/3ML) 0.083% nebulizer solution Take 3 mLs (2.5 mg total) by nebulization every 6 (six) hours as needed for wheezing or shortness of breath. 03/31/22   Teodora Medici, FNP  albuterol (VENTOLIN HFA) 108 (90 Base) MCG/ACT inhaler Inhale 1 puff into the lungs every 6 (six) hours as needed for wheezing or shortness of breath. 10/28/20   Jaynee Eagles, PA-C  amLODipine (NORVASC) 5 MG tablet Take 5 mg by mouth daily.    [provider]  azelastine (ASTELIN) 0.1 % nasal  spray Place 1 spray into both nostrils 2 (two) times daily. Use in each nostril as directed Patient taking differently: Place 1 spray into both nostrils See admin instructions. Instill 1 spray into each nostril two times a day as directed 06/04/18   [name], MD  azithromycin (ZITHROMAX) 250 MG tablet Take 1 tablet (250 mg total) by mouth daily. Take first 2 tablets together, then 1 every day until finished. Patient not taking: Reported on 10/04/2022 08/11/22   Francene Finders, PA-C  cephALEXin (KEFLEX) 500 MG capsule Take 1 capsule (500 mg total) by mouth 2 (two) times daily. Patient not taking: Reported on 10/04/2022 07/13/22   Vanessa Kick, MD  chlorpheniramine-HYDROcodone (TUSSIONEX) 10-8 MG/5ML Take 5 mLs by mouth 2 (two) times daily. Patient not taking: Reported on 10/04/2022 08/13/22   Prosperi, Christian H, PA-C   cromolyn (OPTICROM) 4 % ophthalmic solution Place 1 drop into both eyes 4 (four) times daily as needed. Patient not taking: Reported on 10/04/2022 05/31/22   Clemon Chambers, MD  diclofenac Sodium (VOLTAREN) 1 % GEL Apply topically 4 (four) times daily.    [provider]  gabapentin (NEURONTIN) 300 MG capsule 1 PO HS x 3 days then increase to BID x 3 days, then increase to TID 09/22/22   [provider]  ipratropium (ATROVENT) 0.03 % nasal spray Place 2 sprays into both nostrils 3 (three) times daily. 12/28/21   Clemon Chambers, MD  loratadine (CLARITIN) 10 MG tablet Take 10 mg by mouth daily.    [provider]  mometasone-formoterol (DULERA) 100-5 MCG/ACT AERO Inhale 2 puffs into the lungs 2 (two) times daily. 05/31/22   Clemon Chambers, MD  montelukast (SINGULAIR) 10 MG tablet Take 1 tablet (10 mg total) by mouth at bedtime. Patient not taking: Reported on 10/04/2022 02/22/22   Clemon Chambers, MD  oxybutynin (DITROPAN) 5 MG tablet Take 5 mg by mouth 2 (two) times daily. 11/07/21   [provider]  pantoprazole (PROTONIX) 40 MG tablet Take 1 tablet (40 mg total) by mouth 2 (two) times daily. 02/22/22   Clemon Chambers, MD  Vitamin D, Ergocalciferol, (DRISDOL) 1.25 MG (50000 UT) CAPS capsule Take 50,000 Units by mouth once a week. 05/16/19   [provider]    Family History Family History  Problem Relation Age of Onset   Hypertension Mother    Other Father        unsure of medical history   Breast cancer Maternal Aunt    Allergic rhinitis Neg Hx    Angioedema Neg Hx    Asthma Neg Hx    Atopy Neg Hx    Eczema Neg Hx    Immunodeficiency Neg Hx    Urticaria Neg Hx     Social History Social History   Tobacco Use   Smoking status: Never    Passive exposure: Never   Smokeless tobacco: Never  Vaping Use   Vaping Use: Never used  Substance Use Topics   Alcohol use: Never    Comment: socially   Drug use: Never     Allergies   Patient has no  known allergies.   Review of Systems Review of Systems Per HPI  Physical Exam Triage Vital Signs ED Triage Vitals [10/10/22 0832]  Enc Vitals Group     BP (!) 149/93     Pulse Rate 90     Resp 18     Temp 98.1 F (36.7 C)     Temp Source  Oral     SpO2 97 %     Weight      Height      Head Circumference      Peak Flow      Pain Score 6     Pain Loc      Pain Edu?      Excl. in [place]?    No data found.  Updated Vital Signs BP (!) 149/93 (BP Location: Left Arm)   Pulse 90   Temp 98.1 F (36.7 C) (Oral)   Resp 18   SpO2 97%   Visual Acuity Right Eye Distance:   Left Eye Distance:   Bilateral Distance:    Right Eye Near:   Left Eye Near:    Bilateral Near:     Physical Exam Constitutional:      General: She is not in acute distress.    Appearance: Normal appearance. She is not toxic-appearing or diaphoretic.  HENT:     Head: Normocephalic and atraumatic.  Eyes:     Extraocular Movements: Extraocular movements intact.     Conjunctiva/sclera: Conjunctivae normal.  Pulmonary:     Effort: Pulmonary effort is normal.  Musculoskeletal:     Comments: Tenderness to palpation to lateral left hip and slightly into groin area.  Patient also has tenderness to palpation to left lower back.  No obvious direct spinal tenderness, crepitus, step-off noted.  No obvious swelling, discoloration, lacerations, abrasions noted.  Patient can stand up and bear weight.  Has full range of motion of hip joint.  Neurological:     General: No focal deficit present.     Mental Status: She is alert and oriented to person, place, and time. Mental status is at baseline.     Deep Tendon Reflexes: Reflexes are normal and symmetric.  Psychiatric:        Mood and Affect: Mood normal.        Behavior: Behavior normal.        Thought Content: Thought content normal.        Judgment: Judgment normal.      UC Treatments / Results  Labs (all labs ordered are listed, but only abnormal results  are displayed) Labs Reviewed - No data to display  EKG   Radiology DG Lumbar Spine Complete  Result Date: 10/10/2022 CLINICAL DATA:  Provided history: Fall. Additional history provided: Fall 3 days ago, pain in left groin and side. EXAM: LUMBAR SPINE - COMPLETE 4+ VIEW COMPARISON:  Lumbar spine MRI 04/23/2021. FINDINGS: Five lumbar vertebrae. The caudal most well-formed intervertebral disc space is designated L5-S1. Levocurvature of the lumbar spine. Slight L3-L4 grade 1 retrolisthesis. No lumbar vertebral compression fracture. Mild multilevel disc space narrowing. Multilevel ventrolateral osteophytes. Facet arthrosis, greatest at L4-L5 and L5-S1. IMPRESSION: 1. No lumbar vertebral compression fracture. 2. Lumbar spondylosis, as described. 3. Levocurvature of the lumbar spine. 4. Slight grade 1 retrolisthesis at L3-L4. Electronically Signed   By: Kellie Simmering D.O.   On: 10/10/2022 09:17   DG Hip Unilat With Pelvis 2-3 Views Left  Result Date: 10/10/2022 CLINICAL DATA:  Left groin pain since falling 3 days ago. EXAM: DG HIP (WITH OR WITHOUT PELVIS) 2-3V LEFT COMPARISON:  Radiographs 05/21/2018. FINDINGS: The mineralization and alignment are normal. There is no evidence of acute fracture or dislocation. No evidence of femoral head osteonecrosis. There are degenerative changes at both hips with femoral head acetabular overcoverage which may predispose to pincer type femoroacetabular impingement. Mild lower lumbar spondylosis. The soft tissues  appear unremarkable. IMPRESSION: No evidence of acute fracture or dislocation. 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 Electronically Signed   By: Richardean Sale M.D.   On: 10/10/2022 09:15    Procedures Procedures (including critical care time)  Medications Ordered in UC Medications - No data to display  Initial Impression / Assessment and Plan / UC Course  I have reviewed the triage  vital signs and the nursing notes.  Pertinent labs & imaging results that were available during my care of the patient were reviewed by me and considered in my medical decision making (see chart for details).     X-rays were negative for any acute bony abnormality.  They did show some degenerative changes which were discussed with patient.  Encouraged patient to follow-up with her already established orthopedist for further evaluation and management given persistent pain.  Suggested muscle relaxer but patient reports that she has this at home so she was encouraged to take this.  Discussed alternating ice and heat and supportive care as well.  Discussed return precautions.  Patient verbalized understanding and was agreeable with plan. Final Clinical Impressions(s) / UC Diagnoses   Final diagnoses:  Left hip pain  Acute left-sided low back pain without sciatica  Fall, initial encounter     Discharge Instructions      Your x-rays did not show any acute abnormalities.  I recommend that you follow-up with your established orthopedist for further evaluation and management.  I also recommend that you take the muscle relaxer that you have at home as I do think it will be beneficial.    ED Prescriptions   None    PDMP not reviewed this encounter.   Teodora Medici, [place] 10/10/22 [phone]

## 2022-11-23 ENCOUNTER — Ambulatory Visit: Payer: Medicaid Other

## 2022-12-03 ENCOUNTER — Other Ambulatory Visit: Payer: Self-pay

## 2022-12-03 ENCOUNTER — Encounter: Payer: Self-pay | Admitting: Emergency Medicine

## 2022-12-03 ENCOUNTER — Ambulatory Visit
Admission: EM | Admit: 2022-12-03 | Discharge: 2022-12-03 | Disposition: A | Discharge status: Home / Self Care | Payer: Medicaid Other | Attending: Physician Assistant | Admitting: Physician Assistant

## 2022-12-03 ENCOUNTER — Ambulatory Visit (INDEPENDENT_AMBULATORY_CARE_PROVIDER_SITE_OTHER): Payer: Medicaid Other

## 2022-12-03 DIAGNOSIS — R091 Pleurisy: Secondary | ICD-10-CM | POA: Diagnosis not present

## 2022-12-03 DIAGNOSIS — J069 Acute upper respiratory infection, unspecified: Secondary | ICD-10-CM | POA: Diagnosis not present

## 2022-12-03 DIAGNOSIS — R071 Chest pain on breathing: Secondary | ICD-10-CM | POA: Diagnosis not present

## 2022-12-03 DIAGNOSIS — R0989 Other specified symptoms and signs involving the circulatory and respiratory systems: Secondary | ICD-10-CM | POA: Diagnosis not present

## 2022-12-03 MED ORDER — IBUPROFEN 600 MG PO TABS: 600.0000 mg | ORAL_TABLET | Freq: Four times a day (QID) | ORAL | 0 refills | Status: DC | PRN

## 2022-12-03 MED ORDER — PREDNISONE 10 MG PO TABS: 10.0000 mg | ORAL_TABLET | Freq: Three times a day (TID) | ORAL | 0 refills | Status: DC

## 2022-12-03 NOTE — ED Provider Notes (Signed)
[name]    CSN: [id number] Arrival date & time: 12/03/22  [id number]      History   Chief Complaint Chief Complaint  Patient presents with   Asthma    HPI Joyce Welch is a 52 y.o. female.   52 year old female presents with congestion and pain when she breathes.  Patient indicates for the past week she has been having upper respiratory congestion with intermittent cough.  She indicates she has been having a pain which is in the left upper back and along the lower scapular border.  She indicates she has pain when she breathes, coughs, or leaning back against the area.  Patient indicates she did see her PCP and she did have a EKG performed several days ago which was normal.  Patient indicates she has not had fever, chills, or shortness of breath that she indicates she is using her albuterol inhaler on a regular basis and has not had recent wheezing.  She indicates she is concerned because she continues to have pain with breathing deep and with motion.  She indicates production has been clear.  She is not taking any OTC medicines to control her discomfort.  No history of trauma to the area.  She indicates that she did just recently finished an antibiotic for sinus infection 2 to 3 days ago.   Asthma    Past Medical History:  Diagnosis Date   Asthma    Chronic cough    COVID-19 09/18/2021   Fibroids    GERD (gastroesophageal reflux disease)    Hypertension    OAB (overactive bladder)    UTI (urinary tract infection)     Patient Active Problem List   Diagnosis Date Noted   Pulmonary nodule 10/04/2022   Allergic conjunctivitis of both eyes 02/22/2022   Perennial allergic rhinitis 02/22/2022   Gastroesophageal reflux disease 02/22/2022   Mild persistent asthma without complication [id number]   Chest pain of uncertain etiology [id number]   Heart murmur 12/06/2021   Paresthesia 10/17/2018   Weakness 10/17/2018   Chronic throat clearing 06/20/2018   Post-nasal  drip 06/20/2018   Globus sensation 06/20/2018   Asthma exacerbation 10/31/2017   Leukocytosis 10/31/2017   Strain of quadriceps tendon 02/14/2014   Symptomatic menopausal or female climacteric states 07/31/2013    Past Surgical History:  Procedure Laterality Date   ABDOMINAL HYSTERECTOMY  02/05/2006   partial   [place], 2002, 2005,    x 3   MAXILLARY ANTROSTOMY Right 10/07/2018   Procedure: RIGHT MAXILLARY ANTROSTOMY WITH TISSUE REMOVAL.;  Surgeon: Melida Quitter, MD;  Location: [facility];  Service: ENT;  Laterality: Right;    OB History     Gravida  3   Para  3   Term  1   Preterm      AB      Living  3      SAB      IAB      Ectopic      Multiple      Live Births  3            Home Medications    Prior to Admission medications   Medication Sig Start Date End Date Taking? Authorizing Provider  ibuprofen (ADVIL) 600 MG tablet Take 1 tablet (600 mg total) by mouth every 6 (six) hours as needed. 12/03/22  Yes Nyoka Lint, PA-C  predniSONE (DELTASONE) 10 MG tablet Take 1 tablet (10 mg total) by mouth in the  morning, at noon, and at bedtime. 12/03/22  Yes Nyoka Lint, PA-C  albuterol (PROVENTIL) (2.5 MG/3ML) 0.083% nebulizer solution Take 3 mLs (2.5 mg total) by nebulization every 6 (six) hours as needed for wheezing or shortness of breath. Patient not taking: Reported on 10/04/2022 10/28/20   Jaynee Eagles, PA-C  albuterol (PROVENTIL) (2.5 MG/3ML) 0.083% nebulizer solution Take 3 mLs (2.5 mg total) by nebulization every 6 (six) hours as needed for wheezing or shortness of breath. 03/31/22   Teodora Medici, FNP  albuterol (VENTOLIN HFA) 108 (90 Base) MCG/ACT inhaler Inhale 1 puff into the lungs every 6 (six) hours as needed for wheezing or shortness of breath. 10/28/20   Jaynee Eagles, PA-C  amLODipine (NORVASC) 5 MG tablet Take 5 mg by mouth daily.    [provider]  azelastine (ASTELIN) 0.1 % nasal spray Place 1 spray into both  nostrils 2 (two) times daily. Use in each nostril as directed Patient taking differently: Place 1 spray into both nostrils See admin instructions. Instill 1 spray into each nostril two times a day as directed 06/04/18   [name], MD  azithromycin (ZITHROMAX) 250 MG tablet Take 1 tablet (250 mg total) by mouth daily. Take first 2 tablets together, then 1 every day until finished. Patient not taking: Reported on 10/04/2022 08/11/22   Francene Finders, PA-C  cephALEXin (KEFLEX) 500 MG capsule Take 1 capsule (500 mg total) by mouth 2 (two) times daily. Patient not taking: Reported on 10/04/2022 07/13/22   Vanessa Kick, MD  chlorpheniramine-HYDROcodone (TUSSIONEX) 10-8 MG/5ML Take 5 mLs by mouth 2 (two) times daily. Patient not taking: Reported on 10/04/2022 08/13/22   Prosperi, Christian H, PA-C  cromolyn (OPTICROM) 4 % ophthalmic solution Place 1 drop into both eyes 4 (four) times daily as needed. Patient not taking: Reported on 10/04/2022 05/31/22   Clemon Chambers, MD  diclofenac Sodium (VOLTAREN) 1 % GEL Apply topically 4 (four) times daily.    [provider]  gabapentin (NEURONTIN) 300 MG capsule 1 PO HS x 3 days then increase to BID x 3 days, then increase to TID 09/22/22   [provider]  ipratropium (ATROVENT) 0.03 % nasal spray Place 2 sprays into both nostrils 3 (three) times daily. 12/28/21   Clemon Chambers, MD  loratadine (CLARITIN) 10 MG tablet Take 10 mg by mouth daily.    [provider]  mometasone-formoterol (DULERA) 100-5 MCG/ACT AERO Inhale 2 puffs into the lungs 2 (two) times daily. 05/31/22   Clemon Chambers, MD  montelukast (SINGULAIR) 10 MG tablet Take 1 tablet (10 mg total) by mouth at bedtime. Patient not taking: Reported on 10/04/2022 02/22/22   Clemon Chambers, MD  oxybutynin (DITROPAN) 5 MG tablet Take 5 mg by mouth 2 (two) times daily. 11/07/21   [provider]  pantoprazole (PROTONIX) 40 MG tablet Take 1 tablet (40 mg total) by mouth 2 (two) times  daily. 02/22/22   Clemon Chambers, MD  Vitamin D, Ergocalciferol, (DRISDOL) 1.25 MG (50000 UT) CAPS capsule Take 50,000 Units by mouth once a week. 05/16/19   [provider]    Family History Family History  Problem Relation Age of Onset   Hypertension Mother    Other Father        unsure of medical history   Breast cancer Maternal Aunt    Allergic rhinitis Neg Hx    Angioedema Neg Hx    Asthma Neg Hx    Atopy Neg Hx  Eczema Neg Hx    Immunodeficiency Neg Hx    Urticaria Neg Hx     Social History Social History   Tobacco Use   Smoking status: Never    Passive exposure: Never   Smokeless tobacco: Never  Vaping Use   Vaping Use: Never used  Substance Use Topics   Alcohol use: Never    Comment: socially   Drug use: Never     Allergies   Patient has no known allergies.   Review of Systems Review of Systems  Respiratory:  Positive for cough.   Musculoskeletal:  Positive for back pain (left upper back at mid lower scapula area).     Physical Exam Triage Vital Signs ED Triage Vitals  Enc Vitals Group     BP 12/03/22 1028 (!) 138/90     Pulse Rate 12/03/22 1028 70     Resp 12/03/22 1028 18     Temp 12/03/22 1028 98 F (36.7 C)     Temp Source 12/03/22 1028 Oral     SpO2 12/03/22 1028 97 %     Weight --      Height --      Head Circumference --      Peak Flow --      Pain Score 12/03/22 1029 5     Pain Loc --      Pain Edu? --      Excl. in [place]? --    No data found.  Updated Vital Signs BP (!) 138/90 (BP Location: Left Arm)   Pulse 70   Temp 98 F (36.7 C) (Oral)   Resp 18   SpO2 97%   Visual Acuity Right Eye Distance:   Left Eye Distance:   Bilateral Distance:    Right Eye Near:   Left Eye Near:    Bilateral Near:     Physical Exam Constitutional:      Appearance: Normal appearance.  HENT:     Right Ear: Tympanic membrane and ear canal normal.     Left Ear: Tympanic membrane and ear canal normal.     Mouth/Throat:     Mouth:  Mucous membranes are moist.     Pharynx: Oropharynx is clear.  Cardiovascular:     Rate and Rhythm: Normal rate and regular rhythm.     Heart sounds: Normal heart sounds.  Pulmonary:     Effort: Pulmonary effort is normal.     Breath sounds: Normal breath sounds and air entry. No wheezing, rhonchi or rales.  Lymphadenopathy:     Cervical: No cervical adenopathy.  Skin:         Comments: Back: Left mid back just below the lower scapular border with mild pain on palpation.  There is no unusual swelling, there are no rashes present.  Neurological:     Mental Status: She is alert.      UC Treatments / Results  Labs (all labs ordered are listed, but only abnormal results are displayed) Labs Reviewed - No data to display  EKG   Radiology DG Chest 2 View  Result Date: 12/03/2022 CLINICAL DATA:  Congestion for 7 days. History of asthma. Pain with breathing. EXAM: CHEST - 2 VIEW COMPARISON:  Chest radiograph 08/13/2022 FINDINGS: The cardiomediastinal silhouette is normal There is no focal consolidation or pulmonary edema. There is no pleural effusion or pneumothorax There is no acute osseous abnormality. IMPRESSION: No radiographic evidence of acute cardiopulmonary process. Electronically Signed   By: Court Joy.D.  On: 12/03/2022 11:18    Procedures Procedures (including critical care time)  Medications Ordered in UC Medications - No data to display  Initial Impression / Assessment and Plan / UC Course  I have reviewed the triage vital signs and the nursing notes.  Pertinent labs & imaging results that were available during my care of the patient were reviewed by me and considered in my medical decision making (see chart for details).    Plan: The diagnosis be treated with the following: 1.  Pleuritis: A.  Ibuprofen 600 mg every 6 hours with food on a regular basis to help decrease pain and discomfort. B.  Prednisone 10 mg 3 times a day for 5 days only to help reduce  inflammatory component. 2.  Upper respiratory tract infection: A.  Advised to continue to use albuterol inhaler, 2 puffs every 6 hours as needed for congestion, cough, wheezing. 3.  Advised follow-up PCP or return to urgent care if symptoms fail to improve. Final Clinical Impressions(s) / UC Diagnoses   Final diagnoses:  Acute upper respiratory infection  Pleuritis     Discharge Instructions      Advised to continue to use the albuterol inhaler on a regular basis to help control wheezing and shortness of breath.  Advised that the pain on the left upper posterior chest may be due to pleural inflammation.  Advised to take ibuprofen 600 mg every 6 hours with food on a regular basis for the next several days to help decrease pain. Advised take prednisone 10 mg 3 times a day for 5 days only to help reduce the inflammatory component.  Advised to follow-up PCP or return to urgent care if symptoms fail to improve.    ED Prescriptions     Medication Sig Dispense Auth. Provider   ibuprofen (ADVIL) 600 MG tablet Take 1 tablet (600 mg total) by mouth every 6 (six) hours as needed. 30 tablet Nyoka Lint, PA-C   predniSONE (DELTASONE) 10 MG tablet Take 1 tablet (10 mg total) by mouth in the morning, at noon, and at bedtime. 15 tablet Nyoka Lint, PA-C      PDMP not reviewed this encounter.   Nyoka Lint, PA-C 12/03/22 1125

## 2022-12-03 NOTE — ED Triage Notes (Signed)
Pt here for increased asthma type sx with pain in back with inspiration and nasal congestion; pt denies cough

## 2022-12-03 NOTE — Discharge Instructions (Addendum)
Advised to continue to use the albuterol inhaler on a regular basis to help control wheezing and shortness of breath.  Advised that the pain on the left upper posterior chest may be due to pleural inflammation.  Advised to take ibuprofen 600 mg every 6 hours with food on a regular basis for the next several days to help decrease pain. Advised take prednisone 10 mg 3 times a day for 5 days only to help reduce the inflammatory component.  Advised to follow-up PCP or return to urgent care if symptoms fail to improve.

## 2022-12-04 ENCOUNTER — Other Ambulatory Visit: Payer: Self-pay | Admitting: Internal Medicine

## 2022-12-04 DIAGNOSIS — Z1231 Encounter for screening mammogram for malignant neoplasm of breast: Secondary | ICD-10-CM

## 2023-01-15 ENCOUNTER — Ambulatory Visit
Admission: RE | Admit: 2023-01-15 | Discharge: 2023-01-15 | Disposition: A | Discharge status: Home / Self Care | Payer: Medicaid Other | Source: Ambulatory Visit | Attending: Internal Medicine | Admitting: Internal Medicine

## 2023-01-15 DIAGNOSIS — Z1231 Encounter for screening mammogram for malignant neoplasm of breast: Secondary | ICD-10-CM

## 2023-06-14 ENCOUNTER — Emergency Department (HOSPITAL_COMMUNITY)
Admission: EM | Admit: 2023-06-14 | Discharge: 2023-06-15 | Disposition: A | Discharge status: Home / Self Care | Payer: Medicaid Other | Attending: Emergency Medicine | Admitting: Emergency Medicine

## 2023-06-14 ENCOUNTER — Emergency Department (HOSPITAL_COMMUNITY): Payer: Medicaid Other

## 2023-06-14 ENCOUNTER — Other Ambulatory Visit: Payer: Self-pay

## 2023-06-14 DIAGNOSIS — R0600 Dyspnea, unspecified: Secondary | ICD-10-CM | POA: Diagnosis not present

## 2023-06-14 DIAGNOSIS — R0602 Shortness of breath: Secondary | ICD-10-CM | POA: Diagnosis not present

## 2023-06-14 DIAGNOSIS — I10 Essential (primary) hypertension: Secondary | ICD-10-CM | POA: Insufficient documentation

## 2023-06-14 DIAGNOSIS — R079 Chest pain, unspecified: Secondary | ICD-10-CM | POA: Diagnosis present

## 2023-06-14 DIAGNOSIS — Z7951 Long term (current) use of inhaled steroids: Secondary | ICD-10-CM | POA: Insufficient documentation

## 2023-06-14 DIAGNOSIS — Z8616 Personal history of COVID-19: Secondary | ICD-10-CM | POA: Insufficient documentation

## 2023-06-14 DIAGNOSIS — Z79899 Other long term (current) drug therapy: Secondary | ICD-10-CM | POA: Diagnosis not present

## 2023-06-14 DIAGNOSIS — J45909 Unspecified asthma, uncomplicated: Secondary | ICD-10-CM | POA: Diagnosis not present

## 2023-06-14 LAB — CBC
HCT: 38.4 % (ref 36.0–46.0)
Hemoglobin: 11.6 g/dL — ABNORMAL LOW (ref 12.0–15.0)
MCH: 24.3 pg — ABNORMAL LOW (ref 26.0–34.0)
MCHC: 30.2 g/dL (ref 30.0–36.0)
MCV: 80.3 fL (ref 80.0–100.0)
Platelets: 248 10*3/uL (ref 150–400)
RBC: 4.78 MIL/uL (ref 3.87–5.11)
RDW: 13.2 % (ref 11.5–15.5)
WBC: 9.4 10*3/uL (ref 4.0–10.5)
nRBC: 0 % (ref 0.0–0.2)

## 2023-06-14 LAB — BASIC METABOLIC PANEL
Anion gap: 8 (ref 5–15)
BUN: 10 mg/dL (ref 6–20)
CO2: 25 mmol/L (ref 22–32)
Calcium: 8.8 mg/dL — ABNORMAL LOW (ref 8.9–10.3)
Chloride: 107 mmol/L (ref 98–111)
Creatinine, Ser: 0.97 mg/dL (ref 0.44–1.00)
GFR, Estimated: >60 mL/min (ref >60)
Glucose, Bld: 96 mg/dL (ref 70–99)
Potassium: 3.8 mmol/L (ref 3.5–5.1)
Sodium: 140 mmol/L (ref 135–145)

## 2023-06-14 LAB — TROPONIN I (HIGH SENSITIVITY)
Troponin I (High Sensitivity): 5 ng/L (ref <18)
Troponin I (High Sensitivity): 5 ng/L (ref <18)

## 2023-06-14 LAB — PROTIME-INR
INR: 1.1 (ref 0.8–1.2)
Prothrombin Time: 13.9 s (ref 11.4–15.2)

## 2023-06-14 NOTE — ED Triage Notes (Signed)
Patient reports intermittent central chest pain with SOB for 1 week , denies emesis or diaphoresis . No cough or fever .

## 2023-06-15 ENCOUNTER — Emergency Department (HOSPITAL_COMMUNITY): Payer: Medicaid Other

## 2023-06-15 MED ORDER — IPRATROPIUM-ALBUTEROL 0.5-2.5 (3) MG/3ML IN SOLN
3.0000 mL | Freq: Once | RESPIRATORY_TRACT | Status: AC
  Administered 2023-06-15: 3 mL via RESPIRATORY_TRACT
  Filled 2023-06-15: qty 3

## 2023-06-15 MED ORDER — PREDNISONE 20 MG PO TABS: ORAL_TABLET | ORAL | 0 refills | Status: DC

## 2023-06-15 MED ORDER — IOHEXOL 350 MG/ML SOLN
150.0000 mL | Freq: Once | INTRAVENOUS | Status: AC | PRN
  Administered 2023-06-15: 150 mL via INTRAVENOUS

## 2023-06-15 NOTE — ED Provider Notes (Signed)
Emergency Department Provider Note  I have reviewed the triage vital signs and the nursing notes.  HISTORY  Chief Complaint Chest Pain   HPI Joyce Welch is a 52 y.o. female with presents to the ER today with chest pain dyspnea.  Patient states that the dyspnea seems to be when she goes up the steps she is quite winded.  She has a history of being winded going up the steps was gotten a lot worse recently.  She also states that she will have 5 or 10 minutes of an abnormal feeling in her anterior chest that radiates towards her back.  She states that this is not related to exertion but is more related to position.  States when she lies down it seems to be worse.  No dyspnea when lying down.  She has swollen lower extremities but she states this because she needs new knees and does not necessarily new for her.  No recent fever or cough.  No nausea, vomiting, diaphoresis, lightheadedness associated with her symptoms.  PMH Past Medical History:  Diagnosis Date   Asthma    Chronic cough    COVID-19 09/18/2021   Fibroids    GERD (gastroesophageal reflux disease)    Hypertension    OAB (overactive bladder)    UTI (urinary tract infection)     Home Medications Prior to Admission medications   Medication Sig Start Date End Date Taking? Authorizing Provider  albuterol (PROVENTIL) (2.5 MG/3ML) 0.083% nebulizer solution Take 3 mLs (2.5 mg total) by nebulization every 6 (six) hours as needed for wheezing or shortness of breath. Patient not taking: Reported on 10/04/2022 10/28/20   Wallis Bamberg, PA-C  albuterol (PROVENTIL) (2.5 MG/3ML) 0.083% nebulizer solution Take 3 mLs (2.5 mg total) by nebulization every 6 (six) hours as needed for wheezing or shortness of breath. 03/31/22   Gustavus Bryant, FNP  albuterol (VENTOLIN HFA) 108 (90 Base) MCG/ACT inhaler Inhale 1 puff into the lungs every 6 (six) hours as needed for wheezing or shortness of breath. 10/28/20   Wallis Bamberg, PA-C  amLODipine  (NORVASC) 5 MG tablet Take 5 mg by mouth daily.    [provider]  azelastine (ASTELIN) 0.1 % nasal spray Place 1 spray into both nostrils 2 (two) times daily. Use in each nostril as directed Patient taking differently: Place 1 spray into both nostrils See admin instructions. Instill 1 spray into each nostril two times a day as directed 06/04/18   Coralyn Helling, MD  azithromycin (ZITHROMAX) 250 MG tablet Take 1 tablet (250 mg total) by mouth daily. Take first 2 tablets together, then 1 every day until finished. Patient not taking: Reported on 10/04/2022 08/11/22   Tomi Bamberger, PA-C  cephALEXin (KEFLEX) 500 MG capsule Take 1 capsule (500 mg total) by mouth 2 (two) times daily. Patient not taking: Reported on 10/04/2022 07/13/22   Mardella Layman, MD  chlorpheniramine-HYDROcodone (TUSSIONEX) 10-8 MG/5ML Take 5 mLs by mouth 2 (two) times daily. Patient not taking: Reported on 10/04/2022 08/13/22   Prosperi, Christian H, PA-C  cromolyn (OPTICROM) 4 % ophthalmic solution Place 1 drop into both eyes 4 (four) times daily as needed. Patient not taking: Reported on 10/04/2022 05/31/22   Verlee Monte, MD  diclofenac Sodium (VOLTAREN) 1 % GEL Apply topically 4 (four) times daily.    [provider]  gabapentin (NEURONTIN) 300 MG capsule 1 PO HS x 3 days then increase to BID x 3 days, then increase to TID 09/22/22  [provider]  ibuprofen (ADVIL) 600 MG tablet Take 1 tablet (600 mg total) by mouth every 6 (six) hours as needed. 12/03/22   Ellsworth Lennox, PA-C  ipratropium (ATROVENT) 0.03 % nasal spray Place 2 sprays into both nostrils 3 (three) times daily. 12/28/21   Verlee Monte, MD  loratadine (CLARITIN) 10 MG tablet Take 10 mg by mouth daily.    [provider]  mometasone-formoterol (DULERA) 100-5 MCG/ACT AERO Inhale 2 puffs into the lungs 2 (two) times daily. 05/31/22   Verlee Monte, MD  montelukast (SINGULAIR) 10 MG tablet Take 1 tablet (10 mg total) by mouth at  bedtime. Patient not taking: Reported on 10/04/2022 02/22/22   Verlee Monte, MD  oxybutynin (DITROPAN) 5 MG tablet Take 5 mg by mouth 2 (two) times daily. 11/07/21   [provider]  pantoprazole (PROTONIX) 40 MG tablet Take 1 tablet (40 mg total) by mouth 2 (two) times daily. 02/22/22   Verlee Monte, MD  predniSONE (DELTASONE) 10 MG tablet Take 1 tablet (10 mg total) by mouth in the morning, at noon, and at bedtime. 12/03/22   Ellsworth Lennox, PA-C  Vitamin D, Ergocalciferol, (DRISDOL) 1.25 MG (50000 UT) CAPS capsule Take 50,000 Units by mouth once a week. 05/16/19   [provider]    Social History Social History   Tobacco Use   Smoking status: Never    Passive exposure: Never   Smokeless tobacco: Never  Vaping Use   Vaping status: Never Used  Substance Use Topics   Alcohol use: Never    Comment: socially   Drug use: Never    Review of Systems: Documented in HPI ____________________________________________  PHYSICAL EXAM: VITAL SIGNS: ED Triage Vitals  Encounter Vitals Group     BP 06/14/23 2006 (!) 145/83     Systolic BP Percentile --      Diastolic BP Percentile --      Pulse Rate 06/14/23 2006 94     Resp 06/14/23 2006 18     Temp 06/14/23 2006 98.8 F (37.1 C)     Temp Source 06/14/23 2006 Oral     SpO2 06/14/23 2006 100 %   Physical Exam Vitals and nursing note reviewed.  Constitutional:      Appearance: She is well-developed.  HENT:     Head: Normocephalic and atraumatic.  Cardiovascular:     Rate and Rhythm: Normal rate and regular rhythm.  Pulmonary:     Effort: No respiratory distress.     Breath sounds: No stridor.  Abdominal:     General: There is no distension.  Musculoskeletal:     Cervical back: Normal range of motion.  Neurological:     Mental Status: She is alert.       ____________________________________________   LABS (all labs ordered are listed, but only abnormal results are displayed)  Labs Reviewed  BASIC METABOLIC  PANEL - Abnormal; Notable for the following components:      Result Value   Calcium 8.8 (*)    All other components within normal limits  CBC - Abnormal; Notable for the following components:   Hemoglobin 11.6 (*)    MCH 24.3 (*)    All other components within normal limits  PROTIME-INR  TROPONIN I (HIGH SENSITIVITY)  TROPONIN I (HIGH SENSITIVITY)   ____________________________________________  EKG   EKG Interpretation Date/Time:  Thursday June 14 2023 20:08:03 EDT Ventricular Rate:  93 PR Interval:  162 QRS Duration:  74 QT Interval:  356  QTC Calculation: 442 R Axis:   -1  Text Interpretation: Normal sinus rhythm Normal ECG When compared with ECG of 07-May-2022 06:53, PREVIOUS ECG IS PRESENT Confirmed by Marily Memos [id number]) on 06/15/2023 12:32:58 AM        ____________________________________________  RADIOLOGY  DG Chest 2 View  Result Date: 06/14/2023 CLINICAL DATA:  Chest pain. EXAM: CHEST - 2 VIEW COMPARISON:  December 03, 2022 FINDINGS: The heart size and mediastinal contours are within normal limits. Both lungs are clear. The visualized skeletal structures are unremarkable. IMPRESSION: No active cardiopulmonary disease. Electronically Signed   By: Aram Candela M.D.   On: 06/14/2023 21:45   ____________________________________________  PROCEDURES  Procedure(s) performed:   Procedures ____________________________________________  INITIAL IMPRESSION / ASSESSMENT AND PLAN   This patient presents to the ED for concern of chest pain and shortness of breath, this involves an extensive number of treatment options, and is a complaint that carries with it a high risk of complications and morbidity.  The differential diagnosis includes ACS, PE, pneumonia, pneumothorax, pericardial effusion.  Unlikely be ACS with a normal EKG and negative troponins.  However it is exertional so she will need cardiology follow-up for that.  No symptoms or findings of pneumonia on  x-ray.  No risk factors or evidence of pneumothorax.  Will obtain PET scan to evaluate lungs further and rule out PE.  If these are okay can likely be discharged.  Additional history obtained:  Additional history obtained from N/A Previous records obtained and reviewed in epic  Co morbidities that complicate the patient evaluation  Obesity, hypertension  Social Determinants of Health:  N/A  ED Course  Images ordered viewed and obtained by myself. Agree with Radiology interpretation. Details in ED course.  Labs ordered reviewed by myself as detailed in ED course.  Consultations obtained/considered detailed in ED course.        Cardiac Monitoring:  The patient was maintained on a cardiac monitor.  I personally viewed and interpreted the cardiac monitored which showed an underlying rhythm of: sinus  CRITICAL INTERVENTIONS:  N/a  Reevaluation:  After the interventions noted above, I reevaluated the patient and found that they have :resolved. CT scan reviewed by myself without obvious PE/PNA/PTX. Pain free. Normal VS. Low suspciion for emergent etiology for symptoms, stable for d/c with pcp follow up.  FINAL IMPRESSION AND PLAN Final diagnoses:  None    A medical screening exam was performed and I feel the patient has had an appropriate workup for their chief complaint at this time and likelihood of emergent condition existing is low. They have been counseled on decision, DISCHARGE, follow up and which symptoms necessitate immediate return to the emergency department. They or their family verbally stated understanding and agreement with plan and discharged in stable condition.   ____________________________________________   NEW OUTPATIENT MEDICATIONS STARTED DURING THIS VISIT:  New Prescriptions   No medications on file    Note:  This note was prepared with assistance of Dragon voice recognition software. Occasional wrong-word or sound-a-like substitutions may have  occurred due to the inherent limitations of voice recognition software.    Elea Holtzclaw, Barbara Cower, MD 06/15/23 [phone]

## 2023-06-15 NOTE — ED Notes (Signed)
Pt in NAD at d/c from ED. A&O. Ambulatory. Respiration even & unlabored. Skin warm & dry. Pt verbalized understanding of d/c teaching including follow up care, medications and reasons to return to the ED. No needs or questions expressed.

## 2023-06-22 ENCOUNTER — Ambulatory Visit
Admission: RE | Admit: 2023-06-22 | Discharge: 2023-06-22 | Disposition: A | Discharge status: Home / Self Care | Payer: Medicaid Other | Source: Ambulatory Visit | Attending: Internal Medicine | Admitting: Internal Medicine

## 2023-06-22 VITALS — BP 121/80 | HR 92 | Temp 97.9°F | Resp 16 | Ht 66.0 in | Wt 240.0 lb

## 2023-06-22 DIAGNOSIS — M5442 Lumbago with sciatica, left side: Secondary | ICD-10-CM

## 2023-06-22 LAB — POCT URINALYSIS DIP (MANUAL ENTRY)
Bilirubin, UA: NEGATIVE
Glucose, UA: NEGATIVE mg/dL
Ketones, POC UA: NEGATIVE mg/dL
Leukocytes, UA: NEGATIVE
Nitrite, UA: NEGATIVE
Protein Ur, POC: NEGATIVE mg/dL
Spec Grav, UA: >=1.03 — AB (ref 1.010–1.025)
Urobilinogen, UA: 0.2 E.U./dL
pH, UA: 5.5 (ref 5.0–8.0)

## 2023-06-22 MED ORDER — METHOCARBAMOL 500 MG PO TABS: 500.0000 mg | ORAL_TABLET | Freq: Two times a day (BID) | ORAL | 0 refills | Status: AC | PRN

## 2023-06-22 NOTE — ED Triage Notes (Signed)
Patient here today with c/o LB pain and fatigue since going to the ED on 06/14/2023. Patient is concerned with her urine. She has noticed that her urine has foam that she noticed today.

## 2023-06-22 NOTE — Discharge Instructions (Signed)
I have prescribed you a muscle relaxer to take as needed for suspicion of sciatica causing your back pain.  Please keep in mind that muscle relaxer can make you drowsy so no driving or drinking alcohol with it.  If symptoms persist or worsen, please follow-up at the emergency department.

## 2023-06-22 NOTE — ED Provider Notes (Addendum)
[name]    CSN: [id number] Arrival date & time: 06/22/23  1452      History   Chief Complaint Chief Complaint  Patient presents with   Back Pain    Lower - Entered by patient    HPI Joyce Welch is a 52 y.o. female.   Patient presents today with left lower back pain that radiates down the left leg that started about a week ago.  Denies any injury to the area or any history of chronic back pain.  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  She took ibuprofen this morning with temporary improvement in pain.  She wants her urine checked to ensure that is not related to back pain.  Denies dysuria, urinary frequency, hematuria, vaginal discharge.  Denies exposure to STD.  Denies urinary or bowel incontinence, or saddle anesthesia.   Back Pain   Past Medical History:  Diagnosis Date   Asthma    Chronic cough    COVID-19 09/18/2021   Fibroids    GERD (gastroesophageal reflux disease)    Hypertension    OAB (overactive bladder)    UTI (urinary tract infection)     Patient Active Problem List   Diagnosis Date Noted   Pulmonary nodule 10/04/2022   Allergic conjunctivitis of both eyes 02/22/2022   Perennial allergic rhinitis 02/22/2022   Gastroesophageal reflux disease 02/22/2022   Mild persistent asthma without complication 02/22/2022   Chest pain of uncertain etiology 12/06/2021   Heart murmur 12/06/2021   Paresthesia 10/17/2018   Weakness 10/17/2018   Chronic throat clearing 06/20/2018   Post-nasal drip 06/20/2018   Globus sensation 06/20/2018   Asthma exacerbation 10/31/2017   Leukocytosis 10/31/2017   Strain of quadriceps tendon 02/14/2014   Symptomatic menopausal or female climacteric states 07/31/2013    Past Surgical History:  Procedure Laterality Date   ABDOMINAL HYSTERECTOMY  02/05/2006   partial   CESAREAN SECTION  1994, 2002, 2005,    x 3   MAXILLARY ANTROSTOMY Right 10/07/2018   Procedure:  RIGHT MAXILLARY ANTROSTOMY WITH TISSUE REMOVAL.;  Surgeon: Christia Reading, MD;  Location: [facility];  Service: ENT;  Laterality: Right;    OB History     Gravida  3   Para  3   Term  1   Preterm      AB      Living  3      SAB      IAB      Ectopic      Multiple      Live Births  3            Home Medications    Prior to Admission medications   Medication Sig Start Date End Date Taking? Authorizing Provider  methocarbamol (ROBAXIN) 500 MG tablet Take 1 tablet (500 mg total) by mouth 2 (two) times daily as needed for muscle spasms. 06/22/23  Yes , Rolly Salter E, FNP  albuterol (PROVENTIL) (2.5 MG/3ML) 0.083% nebulizer solution Take 3 mLs (2.5 mg total) by nebulization every 6 (six) hours as needed for wheezing or shortness of breath. 03/31/22   Gustavus Bryant, FNP  albuterol (VENTOLIN HFA) 108 (90 Base) MCG/ACT inhaler Inhale 1 puff into the lungs every 6 (six) hours as needed for wheezing or shortness of breath. 10/28/20   Wallis Bamberg, PA-C  amLODipine (NORVASC) 5 MG tablet Take 5 mg by mouth daily.    [provider]  azelastine (ASTELIN) 0.1 % nasal spray Place 1 spray into both nostrils 2 (two) times daily. Use in each nostril as directed Patient taking differently: Place 1 spray into both nostrils See admin instructions. Instill 1 spray into each nostril two times a day as directed 06/04/18   Coralyn Helling, MD  diclofenac Sodium (VOLTAREN) 1 % GEL Apply topically 4 (four) times daily.    [provider]  ibuprofen (ADVIL) 600 MG tablet Take 1 tablet (600 mg total) by mouth every 6 (six) hours as needed. 12/03/22   Ellsworth Lennox, PA-C  ipratropium (ATROVENT) 0.03 % nasal spray Place 2 sprays into both nostrils 3 (three) times daily. 12/28/21   Verlee Monte, MD  loratadine (CLARITIN) 10 MG tablet Take 10 mg by mouth daily.    [provider]  mometasone-formoterol (DULERA) 100-5 MCG/ACT AERO Inhale 2 puffs into the lungs 2 (two)  times daily. 05/31/22   Verlee Monte, MD  oxybutynin (DITROPAN) 5 MG tablet Take 5 mg by mouth 2 (two) times daily. Patient not taking: Reported on 06/22/2023 11/07/21   [provider]  pantoprazole (PROTONIX) 40 MG tablet Take 1 tablet (40 mg total) by mouth 2 (two) times daily. 02/22/22   Verlee Monte, MD  Vitamin D, Ergocalciferol, (DRISDOL) 1.25 MG (50000 UT) CAPS capsule Take 50,000 Units by mouth once a week. 05/16/19   [provider]    Family History Family History  Problem Relation Age of Onset   Hypertension Mother    Other Father        unsure of medical history   Breast cancer Maternal Aunt    Allergic rhinitis Neg Hx    Angioedema Neg Hx    Asthma Neg Hx    Atopy Neg Hx    Eczema Neg Hx    Immunodeficiency Neg Hx    Urticaria Neg Hx     Social History Social History   Tobacco Use   Smoking status: Never    Passive exposure: Never   Smokeless tobacco: Never  Vaping Use   Vaping status: Never Used  Substance Use Topics   Alcohol use: Never    Comment: socially   Drug use: Never     Allergies   Patient has no known allergies.   Review of Systems Review of Systems Per HPI  Physical Exam Triage Vital Signs ED Triage Vitals [06/22/23 1530]  Encounter Vitals Group     BP 121/80     Systolic BP Percentile      Diastolic BP Percentile      Pulse Rate 92     Resp 16     Temp 97.9 F (36.6 C)     Temp Source Oral     SpO2 96 %     Weight 240 lb (108.9 kg)     Height 5\' 6"  (1.676 m)     Head Circumference      Peak Flow      Pain Score 7     Pain Loc      Pain Education      Exclude from Growth Chart    No data found.  Updated Vital Signs BP 121/80 (BP Location: Left Arm)   Pulse 92   Temp 97.9 F (36.6 C) (Oral)   Resp 16   Ht 5\' 6"  (1.676 m)   Wt 240 lb (108.9 kg)   SpO2 96%   BMI 38.74 kg/m   Visual Acuity Right Eye Distance:   Left Eye  Distance:   Bilateral Distance:    Right Eye Near:   Left Eye Near:     Bilateral Near:     Physical Exam Constitutional:      General: She is not in acute distress.    Appearance: Normal appearance. She is not toxic-appearing or diaphoretic.  HENT:     Head: Normocephalic and atraumatic.  Eyes:     Extraocular Movements: Extraocular movements intact.     Conjunctiva/sclera: Conjunctivae normal.  Pulmonary:     Effort: Pulmonary effort is normal.  Musculoskeletal:       Back:     Comments: Patient has tenderness to palpation to left lower lumbar region.  There is no direct spinal tenderness, crepitus, step-off noted.  Neurological:     General: No focal deficit present.     Mental Status: She is alert and oriented to person, place, and time. Mental status is at baseline.     Deep Tendon Reflexes: Reflexes are normal and symmetric.  Psychiatric:        Mood and Affect: Mood normal.        Behavior: Behavior normal.        Thought Content: Thought content normal.        Judgment: Judgment normal.      UC Treatments / Results  Labs (all labs ordered are listed, but only abnormal results are displayed) Labs Reviewed  POCT URINALYSIS DIP (MANUAL ENTRY) - Abnormal; Notable for the following components:      Result Value   Clarity, UA hazy (*)    Spec Grav, UA >=1.030 (*)    Blood, UA trace-lysed (*)    All other components within normal limits    EKG   Radiology No results found.  Procedures Procedures (including critical care time)  Medications Ordered in UC Medications - No data to display  Initial Impression / Assessment and Plan / UC Course  I have reviewed the triage vital signs and the nursing notes.  Pertinent labs & imaging results that were available during my care of the patient were reviewed by me and considered in my medical decision making (see chart for details).     UA unremarkable.  Low suspicion for kidney stone given patient's description of pain and physical exam.  I am most suspicious of sciatica.  Patient is  concerned that symptoms are related to chest pain and shortness of breath from recent ED visit but I do think these are 2 separate etiologies.  Will treat with muscle relaxer as patient previously took prednisone at ED discharge.  Advised patient muscle relaxer can make her drowsy and do not drive or drink alcohol with taking it.  She denies that she takes any other sedating medications so that should be safe.  Advised strict ER precautions if symptoms persist or worsen.  Patient verbalized understanding and was agreeable with plan. Final Clinical Impressions(s) / UC Diagnoses   Final diagnoses:  Acute left-sided low back pain with left-sided sciatica     Discharge Instructions      I have prescribed you a muscle relaxer to take as needed for suspicion of sciatica causing your back pain.  Please keep in mind that muscle relaxer can make you drowsy so no driving or drinking alcohol with it.  If symptoms persist or worsen, please follow-up at the emergency department.      ED Prescriptions     Medication Sig Dispense Auth. Provider   methocarbamol (ROBAXIN) 500 MG tablet Take 1 tablet (  500 mg total) by mouth 2 (two) times daily as needed for muscle spasms. 20 tablet [place], Acie Fredrickson, [place]      PDMP not reviewed this encounter.   Gustavus Bryant, [place] 06/22/23 1616    Gustavus Bryant, [place] 06/22/23 1616

## 2023-08-11 ENCOUNTER — Ambulatory Visit
Admission: EM | Admit: 2023-08-11 | Discharge: 2023-08-11 | Disposition: A | Discharge status: Home / Self Care | Payer: Medicaid Other | Attending: Internal Medicine | Admitting: Internal Medicine

## 2023-08-11 DIAGNOSIS — H8111 Benign paroxysmal vertigo, right ear: Secondary | ICD-10-CM

## 2023-08-11 DIAGNOSIS — J309 Allergic rhinitis, unspecified: Secondary | ICD-10-CM | POA: Diagnosis not present

## 2023-08-11 DIAGNOSIS — H6993 Unspecified Eustachian tube disorder, bilateral: Secondary | ICD-10-CM | POA: Diagnosis not present

## 2023-08-11 MED ORDER — MECLIZINE HCL 12.5 MG PO TABS: 12.5000 mg | ORAL_TABLET | Freq: Three times a day (TID) | ORAL | 0 refills | Status: AC | PRN

## 2023-08-11 MED ORDER — PREDNISONE 10 MG PO TABS: 30.0000 mg | ORAL_TABLET | Freq: Every day | ORAL | 0 refills | Status: DC

## 2023-08-11 MED ORDER — CETIRIZINE HCL 10 MG PO TABS: 10.0000 mg | ORAL_TABLET | Freq: Every day | ORAL | 0 refills | Status: AC

## 2023-08-11 NOTE — Discharge Instructions (Signed)
We have discussed the possibility of eustachian tube dysfunction, BPPV and allergies as the primary source of your symptoms. I prescribed prednisone and Zyrtec for the suspected primary problem. Use meclizine for the vertigo itself.

## 2023-08-11 NOTE — ED Triage Notes (Addendum)
Pt c/o intermittent dizziness started yesterday after bending down-denies pain-states she had vertigo in the past-NAD-slow gait

## 2023-08-11 NOTE — ED Provider Notes (Signed)
[facility]  Note:  This document was prepared using Conservation officer, historic buildings and may include unintentional dictation errors.  MRN: [id number] DOB: 1970/12/31  Subjective:   Joyce Welch is a 52 y.o. female presenting for 1 day history of acute onset persistent intermittent dizziness, vertigo worse when she lays down or bends down.  Has also had sinus fullness, pressure behind eyes.  Denies severe or overt headache, confusion, vision changes, weakness or numbness or tingling, chest pain, heart racing, palpitations, diaphoresis, nausea, vomiting.  Patient did have slight hand swelling yesterday but is now resolved.  Has a history of perennial allergic rhinitis, asthma.  No current facility-administered medications for this encounter.  Current Outpatient Medications:    albuterol (PROVENTIL) (2.5 MG/3ML) 0.083% nebulizer solution, Take 3 mLs (2.5 mg total) by nebulization every 6 (six) hours as needed for wheezing or shortness of breath., Disp: 75 mL, Rfl: 0   albuterol (VENTOLIN HFA) 108 (90 Base) MCG/ACT inhaler, Inhale 1 puff into the lungs every 6 (six) hours as needed for wheezing or shortness of breath., Disp: 18 g, Rfl: 0   amLODipine (NORVASC) 5 MG tablet, Take 5 mg by mouth daily., Disp: , Rfl:    azelastine (ASTELIN) 0.1 % nasal spray, Place 1 spray into both nostrils 2 (two) times daily. Use in each nostril as directed (Patient taking differently: Place 1 spray into both nostrils See admin instructions. Instill 1 spray into each nostril two times a day as directed), Disp: 30 mL, Rfl: 12   diclofenac Sodium (VOLTAREN) 1 % GEL, Apply topically 4 (four) times daily., Disp: , Rfl:    ibuprofen (ADVIL) 600 MG tablet, Take 1 tablet (600 mg total) by mouth every 6 (six) hours as needed., Disp: 30 tablet, Rfl: 0   ipratropium (ATROVENT) 0.03 % nasal spray, Place 2 sprays into both nostrils 3 (three) times daily., Disp: 30 mL, Rfl: 12   loratadine  (CLARITIN) 10 MG tablet, Take 10 mg by mouth daily., Disp: , Rfl:    methocarbamol (ROBAXIN) 500 MG tablet, Take 1 tablet (500 mg total) by mouth 2 (two) times daily as needed for muscle spasms., Disp: 20 tablet, Rfl: 0   mometasone-formoterol (DULERA) 100-5 MCG/ACT AERO, Inhale 2 puffs into the lungs 2 (two) times daily., Disp: 13 g, Rfl: 4   oxybutynin (DITROPAN) 5 MG tablet, Take 5 mg by mouth 2 (two) times daily. (Patient not taking: Reported on 06/22/2023), Disp: , Rfl:    pantoprazole (PROTONIX) 40 MG tablet, Take 1 tablet (40 mg total) by mouth 2 (two) times daily., Disp: 60 tablet, Rfl: 3   Vitamin D, Ergocalciferol, (DRISDOL) 1.25 MG (50000 UT) CAPS capsule, Take 50,000 Units by mouth once a week., Disp: , Rfl:    No Known Allergies  Past Medical History:  Diagnosis Date   Asthma    Chronic cough    COVID-19 09/18/2021   Fibroids    GERD (gastroesophageal reflux disease)    Hypertension    OAB (overactive bladder)    UTI (urinary tract infection)      Past Surgical History:  Procedure Laterality Date   ABDOMINAL HYSTERECTOMY  02/05/2006   partial   CESAREAN SECTION  1994, 2002, 2005,    x 3   MAXILLARY ANTROSTOMY Right 10/07/2018   Procedure: RIGHT MAXILLARY ANTROSTOMY WITH TISSUE REMOVAL.;  Surgeon: Christia Reading, MD;  Location: [facility];  Service: ENT;  Laterality: Right;    Family History  Problem Relation Age  of Onset   Hypertension Mother    Other Father        unsure of medical history   Breast cancer Maternal Aunt    Allergic rhinitis Neg Hx    Angioedema Neg Hx    Asthma Neg Hx    Atopy Neg Hx    Eczema Neg Hx    Immunodeficiency Neg Hx    Urticaria Neg Hx     Social History   Tobacco Use   Smoking status: Never    Passive exposure: Never   Smokeless tobacco: Never  Vaping Use   Vaping status: Never Used  Substance Use Topics   Alcohol use: Yes    Comment: occ   Drug use: Never    ROS   Objective:   Vitals: BP 127/79 (BP  Location: Right Arm)   Pulse 74   Temp 97.6 F (36.4 C) (Oral)   Resp 20   SpO2 98%   Physical Exam Constitutional:      General: She is not in acute distress.    Appearance: Normal appearance. She is well-developed and normal weight. She is not ill-appearing, toxic-appearing or diaphoretic.  HENT:     Head: Normocephalic and atraumatic.     Right Ear: Ear canal and external ear normal. No drainage or tenderness. No middle ear effusion. There is no impacted cerumen. Tympanic membrane is not erythematous or bulging.     Left Ear: Ear canal and external ear normal. No drainage or tenderness.  No middle ear effusion. There is no impacted cerumen. Tympanic membrane is not erythematous or bulging.     Ears:     Comments: Bilateral middle ear effusions.    Nose: Nose normal. No congestion or rhinorrhea.     Mouth/Throat:     Mouth: Mucous membranes are moist. No oral lesions.     Pharynx: No pharyngeal swelling, oropharyngeal exudate, posterior oropharyngeal erythema or uvula swelling.     Tonsils: No tonsillar exudate or tonsillar abscesses.  Eyes:     General: No scleral icterus.       Right eye: No discharge.        Left eye: No discharge.     Extraocular Movements: Extraocular movements intact.     Right eye: Normal extraocular motion.     Left eye: Normal extraocular motion.     Conjunctiva/sclera: Conjunctivae normal.  Neck:     Meningeal: Brudzinski's sign and Kernig's sign absent.  Cardiovascular:     Rate and Rhythm: Normal rate.  Pulmonary:     Effort: Pulmonary effort is normal.  Musculoskeletal:     Cervical back: Normal range of motion and neck supple.  Lymphadenopathy:     Cervical: No cervical adenopathy.  Skin:    General: Skin is warm and dry.  Neurological:     General: No focal deficit present.     Mental Status: She is alert and oriented to person, place, and time.     Cranial Nerves: No cranial nerve deficit, dysarthria or facial asymmetry.     Motor: No  weakness or pronator drift.     Coordination: Romberg sign negative. Coordination normal. Finger-Nose-Finger Test and Heel to [facility] Test normal. Rapid alternating movements normal.     Gait: Gait and tandem walk normal.     Deep Tendon Reflexes: Reflexes normal.  Psychiatric:        Mood and Affect: Mood normal.        Behavior: Behavior normal.  Thought Content: Thought content normal.        Judgment: Judgment normal.     Assessment and Plan :   PDMP not reviewed this encounter.  1. BPPV (benign paroxysmal positional vertigo), right   2. Allergic rhinitis, unspecified seasonality, unspecified trigger   3. Eustachian tube dysfunction, bilateral     I do not suspect an acute encephalopathy.  Recommended an oral prednisone course to address eustachian tube dysfunction versus allergic rhinitis.  Recommended meclizine for the secondary BPPV.  Maintain strict ER precautions.  Counseled patient on potential for adverse effects with medications prescribed today, patient verbalized understanding.    Wallis Bamberg, PA-C 08/11/23 1108

## 2023-09-18 ENCOUNTER — Ambulatory Visit: Payer: Medicaid Other

## 2023-09-23 ENCOUNTER — Other Ambulatory Visit: Payer: Self-pay

## 2023-09-23 ENCOUNTER — Emergency Department (HOSPITAL_COMMUNITY): Payer: Medicaid Other

## 2023-09-23 ENCOUNTER — Encounter (HOSPITAL_COMMUNITY): Payer: Self-pay

## 2023-09-23 ENCOUNTER — Emergency Department (HOSPITAL_COMMUNITY)
Admission: EM | Admit: 2023-09-23 | Discharge: 2023-09-23 | Disposition: A | Discharge status: Home / Self Care | Payer: Medicaid Other | Attending: Emergency Medicine | Admitting: Emergency Medicine

## 2023-09-23 ENCOUNTER — Ambulatory Visit
Admission: EM | Admit: 2023-09-23 | Discharge: 2023-09-23 | Disposition: A | Discharge status: Other Acute Inpatient Hospital | Payer: Medicaid Other

## 2023-09-23 DIAGNOSIS — J45901 Unspecified asthma with (acute) exacerbation: Secondary | ICD-10-CM

## 2023-09-23 DIAGNOSIS — J069 Acute upper respiratory infection, unspecified: Secondary | ICD-10-CM | POA: Diagnosis not present

## 2023-09-23 DIAGNOSIS — I10 Essential (primary) hypertension: Secondary | ICD-10-CM | POA: Insufficient documentation

## 2023-09-23 DIAGNOSIS — Z79899 Other long term (current) drug therapy: Secondary | ICD-10-CM | POA: Insufficient documentation

## 2023-09-23 DIAGNOSIS — J4541 Moderate persistent asthma with (acute) exacerbation: Secondary | ICD-10-CM | POA: Diagnosis not present

## 2023-09-23 DIAGNOSIS — Z8616 Personal history of COVID-19: Secondary | ICD-10-CM | POA: Insufficient documentation

## 2023-09-23 DIAGNOSIS — Z1152 Encounter for screening for COVID-19: Secondary | ICD-10-CM | POA: Insufficient documentation

## 2023-09-23 DIAGNOSIS — R0602 Shortness of breath: Secondary | ICD-10-CM | POA: Diagnosis present

## 2023-09-23 DIAGNOSIS — Z7951 Long term (current) use of inhaled steroids: Secondary | ICD-10-CM | POA: Insufficient documentation

## 2023-09-23 LAB — BASIC METABOLIC PANEL
Anion gap: 8 (ref 5–15)
BUN: 10 mg/dL (ref 6–20)
CO2: 26 mmol/L (ref 22–32)
Calcium: 8.9 mg/dL (ref 8.9–10.3)
Chloride: 103 mmol/L (ref 98–111)
Creatinine, Ser: 0.94 mg/dL (ref 0.44–1.00)
GFR, Estimated: >60 mL/min (ref >60)
Glucose, Bld: 114 mg/dL — ABNORMAL HIGH (ref 70–99)
Potassium: 4.6 mmol/L (ref 3.5–5.1)
Sodium: 137 mmol/L (ref 135–145)

## 2023-09-23 LAB — RESP PANEL BY RT-PCR (RSV, FLU A&B, COVID)  RVPGX2
Influenza A by PCR: NEGATIVE
Influenza B by PCR: NEGATIVE
Resp Syncytial Virus by PCR: NEGATIVE
SARS Coronavirus 2 by RT PCR: NEGATIVE

## 2023-09-23 LAB — CBC
HCT: 40.3 % (ref 36.0–46.0)
Hemoglobin: 12.4 g/dL (ref 12.0–15.0)
MCH: 25.1 pg — ABNORMAL LOW (ref 26.0–34.0)
MCHC: 30.8 g/dL (ref 30.0–36.0)
MCV: 81.6 fL (ref 80.0–100.0)
Platelets: 262 10*3/uL (ref 150–400)
RBC: 4.94 MIL/uL (ref 3.87–5.11)
RDW: 13.2 % (ref 11.5–15.5)
WBC: 10.3 10*3/uL (ref 4.0–10.5)
nRBC: 0 % (ref 0.0–0.2)

## 2023-09-23 LAB — TROPONIN I (HIGH SENSITIVITY): Troponin I (High Sensitivity): 3 ng/L (ref <18)

## 2023-09-23 MED ORDER — METHYLPREDNISOLONE 4 MG PO TBPK: ORAL_TABLET | ORAL | 0 refills | Status: DC

## 2023-09-23 MED ORDER — IPRATROPIUM-ALBUTEROL 0.5-2.5 (3) MG/3ML IN SOLN
3.0000 mL | RESPIRATORY_TRACT | Status: AC
  Administered 2023-09-23 (×2): 3 mL via RESPIRATORY_TRACT
  Filled 2023-09-23: qty 6

## 2023-09-23 MED ORDER — BENZONATATE 100 MG PO CAPS: 100.0000 mg | ORAL_CAPSULE | Freq: Three times a day (TID) | ORAL | 0 refills | Status: DC

## 2023-09-23 MED ORDER — METHYLPREDNISOLONE SODIUM SUCC 125 MG IJ SOLR
125.0000 mg | Freq: Once | INTRAMUSCULAR | Status: AC
  Administered 2023-09-23: 125 mg via INTRAVENOUS
  Filled 2023-09-23: qty 2

## 2023-09-23 MED ORDER — MAGNESIUM SULFATE 2 GM/50ML IV SOLN
2.0000 g | Freq: Once | INTRAVENOUS | Status: AC
  Administered 2023-09-23: 2 g via INTRAVENOUS
  Filled 2023-09-23: qty 50

## 2023-09-23 MED ORDER — IPRATROPIUM-ALBUTEROL 0.5-2.5 (3) MG/3ML IN SOLN
3.0000 mL | Freq: Once | RESPIRATORY_TRACT | Status: AC
  Administered 2023-09-23: 3 mL via RESPIRATORY_TRACT
  Filled 2023-09-23: qty 3

## 2023-09-23 MED ORDER — IPRATROPIUM-ALBUTEROL 0.5-2.5 (3) MG/3ML IN SOLN
3.0000 mL | Freq: Once | RESPIRATORY_TRACT | Status: AC
  Administered 2023-09-23: 3 mL via RESPIRATORY_TRACT

## 2023-09-23 MED ORDER — AZITHROMYCIN 250 MG PO TABS: ORAL_TABLET | ORAL | 0 refills | Status: DC

## 2023-09-23 NOTE — Discharge Instructions (Signed)
As we discussed, your workup in the ER today was reassuring for acute findings.  Laboratory evaluation, x-ray imaging and EKG did not reveal any emergent cause of your symptoms.  I did offer further evaluation with CT imaging of your chest as well as admission for an asthma exacerbation, however given that you are feeling better after the medications we gave you, you would prefer to ensure symptoms at home which is reasonable.  I have given you a prescription for a Medrol Dosepak for you to take as prescribed in its entirety for management of your symptoms.  I have also given you a prescription for a Z-Pak which you can take as prescribed in its entirety to cover for any bacterial infection causing your symptoms given the duration of your symptoms.  I have also given you Tessalon which she can take as prescribed as needed for your cough.   I recommend that you get plenty of rest and focus on symptomatic relief which includes Cepacol throat lozenges for sore throat, Mucinex DM  for congestion, and tylenol/ibuprofen as needed for fevers and bodyaches. I also recommend:  Increased fluid intake. Sports drinks offer valuable electrolytes, sugars, and fluids.  Breathing heated mist or steam (vaporizer or shower).  Eating chicken soup or other clear broths, and maintaining good nutrition.   Increasing usage of your inhaler if you have asthma.  Return to work when your temperature has returned to normal.  Gargle warm salt water and spit it out for sore throat. Take benadryl or Zyrtec to decrease sinus secretions.  Follow Up: Follow up with your primary care doctor in 5-7 days for recheck of ongoing symptoms.  Return to emergency department for emergent changing or worsening of symptoms.

## 2023-09-23 NOTE — ED Notes (Signed)
Patient is being discharged from the Urgent Care and sent to the Emergency Department via POV . Per RM, patient is in need of higher level of care due to asthma. Patient is aware and verbalizes understanding of plan of care.  Vitals:   09/23/23 1122 09/23/23 1126  BP: 122/71   Pulse: 87   Resp: (!) 34 (!) 30  Temp: 98.1 F (36.7 C)   SpO2: 98% 97%

## 2023-09-23 NOTE — ED Provider Notes (Signed)
[place] [facility]ENC[facility]PARTMENT AT [facility] Provider Note   CSN: [id number] Arrival date & time: 09/23/23  1239     History  Chief Complaint  Patient presents with   Asthma   Chest Pain    Joyce Welch is a 52 y.o. female.  Patient with history of asthma presents today with complaints of cough, congestion, and shortness of breath. States that her symptoms began initially 2 weeks ago. She was able to manage her symptoms with her home inhaler and nebulizer initially, called her pcp 1 week later and was prescribed prednisone which she has 1 more day left in the taper. States that initially after starting the steroids she felt better, however 2 days ago she began to have a more productive cough. States that she is coughing up green mucus. States she feels like she is having difficulty coughing up the mucus and feels like it is stuck in her chest. She states that this results in coughing fits which seem to exacerbate her wheezing/asthma symptoms. Denies fevers or chills. Has been using her nebulizer treatment 3 times a day which is baseline for her with minimal improvement. Denies recent travel or surgeries, leg pain or leg swelling. Endorses tightness in her chest consistent with her previous asthma exacerbations, no pain. States this is how she normally feels when she has a URI with her asthma. Does note that she has been admitted for asthma exacerbation previously and does not feel as bad today as she did then. Denies ever requiring intubation for her asthma previously. Went to urgent care initially, was given 1 neb treatment and then recommended to come here for evaluation with concern for asthma exacerbation.  The history is provided by the patient. No language interpreter was used.  Asthma Associated symptoms include chest pain and shortness of breath.  Chest Pain Associated symptoms: cough and shortness of breath        Home Medications Prior to Admission  medications   Medication Sig Start Date End Date Taking? Authorizing Provider  albuterol (PROVENTIL) (2.5 MG/3ML) 0.083% nebulizer solution Take 3 mLs (2.5 mg total) by nebulization every 6 (six) hours as needed for wheezing or shortness of breath. 03/31/22   Gustavus Bryant, FNP  albuterol (VENTOLIN HFA) 108 (90 Base) MCG/ACT inhaler Inhale 1 puff into the lungs every 6 (six) hours as needed for wheezing or shortness of breath. 10/28/20   Wallis Bamberg, PA-C  ALPRAZolam Prudy Feeler) 0.25 MG tablet Take 0.25 mg by mouth as directed. 10/29/17   [provider]  amLODipine (NORVASC) 5 MG tablet Take 5 mg by mouth daily.    [provider]  azelastine (ASTELIN) 0.1 % nasal spray Place 1 spray into both nostrils 2 (two) times daily. Use in each nostril as directed Patient taking differently: Place 1 spray into both nostrils See admin instructions. Instill 1 spray into each nostril two times a day as directed 06/04/18   Coralyn Helling, MD  cetirizine (ZYRTEC ALLERGY) 10 MG tablet Take 1 tablet (10 mg total) by mouth daily. 08/11/23   Wallis Bamberg, PA-C  diclofenac Sodium (VOLTAREN) 1 % GEL Apply topically 4 (four) times daily.    [provider]  DULoxetine (CYMBALTA) 30 MG capsule Take 1 tablet by mouth daily.    [provider]  fluticasone (FLONASE) 50 MCG/ACT nasal spray Place 2 sprays into both nostrils daily. 06/09/23   [provider]  gabapentin (NEURONTIN) 100 MG capsule Take 100 mg by mouth daily at 6 (  six) AM.    [provider]  ibuprofen (ADVIL) 600 MG tablet Take 1 tablet (600 mg total) by mouth every 6 (six) hours as needed. 12/03/22   Ellsworth Lennox, PA-C  ipratropium (ATROVENT) 0.03 % nasal spray Place 2 sprays into both nostrils 3 (three) times daily. 12/28/21   Verlee Monte, MD  loratadine (CLARITIN) 10 MG tablet Take 10 mg by mouth daily.    [provider]  meclizine (ANTIVERT) 12.5 MG tablet Take 1 tablet (12.5 mg total) by mouth 3 (three)  times daily as needed for dizziness. 08/11/23   Wallis Bamberg, PA-C  methocarbamol (ROBAXIN) 500 MG tablet Take 1 tablet (500 mg total) by mouth 2 (two) times daily as needed for muscle spasms. 06/22/23   [place], Acie Fredrickson, FNP  mometasone-formoterol (DULERA) 100-5 MCG/ACT AERO Inhale 2 puffs into the lungs 2 (two) times daily. 05/31/22   Verlee Monte, MD  nitrofurantoin, macrocrystal-monohydrate, (MACROBID) 100 MG capsule Take 100 mg by mouth 2 (two) times daily. 06/26/23   [provider]  oxybutynin (DITROPAN) 5 MG tablet Take 5 mg by mouth 2 (two) times daily. 11/07/21   [provider]  pantoprazole (PROTONIX) 40 MG tablet Take 1 tablet (40 mg total) by mouth 2 (two) times daily. 02/22/22   Verlee Monte, MD  predniSONE (DELTASONE) 10 MG tablet Take 3 tablets (30 mg total) by mouth daily with breakfast. 08/11/23   Wallis Bamberg, PA-C  sucralfate (CARAFATE) 1 GM/10ML suspension Take 1 g by mouth 3 (three) times daily. 08/23/23   [provider]  tiZANidine (ZANAFLEX) 4 MG tablet Take 8 mg by mouth at bedtime as needed. 08/13/23   [provider]  Vitamin D, Ergocalciferol, (DRISDOL) 1.25 MG (50000 UT) CAPS capsule Take 50,000 Units by mouth once a week. 05/16/19   [provider]      Allergies    Patient has no known allergies.    Review of Systems   Review of Systems  HENT:  Positive for congestion.   Respiratory:  Positive for cough and shortness of breath.   Cardiovascular:  Positive for chest pain.  All other systems reviewed and are negative.   Physical Exam Updated Vital Signs BP 130/72   Pulse 68   Temp 98.2 F (36.8 C) (Oral)   Resp (!) 22   Ht 5\' 6"  (1.676 m)   Wt 108.8 kg   SpO2 98%   BMI 38.71 kg/m  Physical Exam Vitals and nursing note reviewed.  Constitutional:      General: She is not in acute distress.    Appearance: Normal appearance. She is normal weight. She is not ill-appearing, toxic-appearing or diaphoretic.  HENT:      Head: Normocephalic and atraumatic.  Cardiovascular:     Rate and Rhythm: Normal rate and regular rhythm.     Heart sounds: Normal heart sounds.  Pulmonary:     Effort: Pulmonary effort is normal. No respiratory distress.     Breath sounds: Examination of the right-lower field reveals wheezing. Examination of the left-lower field reveals wheezing. Wheezing present.     Comments: Trace expiratory wheezes at the bilateral lung bases. Patient speaking in complete sentences.  Abdominal:     Palpations: Abdomen is soft.     Tenderness: There is no abdominal tenderness.  Musculoskeletal:        General: Normal range of motion.     Cervical back: Normal range of motion.     Right lower leg: No  tenderness. No edema.     Left lower leg: No tenderness. No edema.  Skin:    General: Skin is warm and dry.  Neurological:     General: No focal deficit present.     Mental Status: She is alert.  Psychiatric:        Mood and Affect: Mood normal.        Behavior: Behavior normal.     ED Results / Procedures / Treatments   Labs (all labs ordered are listed, but only abnormal results are displayed) Labs Reviewed  BASIC METABOLIC PANEL - Abnormal; Notable for the following components:      Result Value   Glucose, Bld 114 (*)    All other components within normal limits  CBC - Abnormal; Notable for the following components:   MCH 25.1 (*)    All other components within normal limits  RESP PANEL BY RT-PCR (RSV, FLU A&B, COVID)  RVPGX2  TROPONIN I (HIGH SENSITIVITY)    EKG EKG Interpretation Date/Time:  Sunday September 23 2023 13:01:55 EST Ventricular Rate:  68 PR Interval:  160 QRS Duration:  74 QT Interval:  381 QTC Calculation: 406 R Axis:   2  Text Interpretation: Sinus rhythm Probable left atrial enlargement No acute changes Nonspecific ST and T wave abnormality Confirmed by Derwood Kaplan ([id number]) on 09/23/2023 3:29:00 PM  Radiology DG Chest 2 View Result Date:  09/23/2023 CLINICAL DATA:  chest tightness EXAM: CHEST - 2 VIEW COMPARISON:  06/14/2023 chest radiograph. FINDINGS: Stable cardiomediastinal silhouette with normal heart size. No pneumothorax. No pleural effusion. Lungs appear clear, with no acute consolidative airspace disease and no pulmonary edema. IMPRESSION: No active cardiopulmonary disease. Electronically Signed   By: Delbert Phenix M.D.   On: 09/23/2023 13:31    Procedures Procedures    Medications Ordered in ED Medications  ipratropium-albuterol (DUONEB) 0.5-2.5 (3) MG/3ML nebulizer solution 3 mL (3 mLs Nebulization Given 09/23/23 1636)  methylPREDNISolone sodium succinate (SOLU-MEDROL) 125 mg/2 mL injection 125 mg (125 mg Intravenous Given 09/23/23 1636)  magnesium sulfate IVPB 2 g 50 mL (0 g Intravenous Stopped 09/23/23 1733)  ipratropium-albuterol (DUONEB) 0.5-2.5 (3) MG/3ML nebulizer solution 3 mL (3 mLs Nebulization Given 09/23/23 1805)    ED Course/ Medical Decision Making/ A&P                                 Medical Decision Making Amount and/or Complexity of Data Reviewed Labs: ordered. Radiology: ordered.  Risk Prescription drug management.   This patient is a 52 y.o. female who presents to the ED for concern of shortness of breath, cough, congestion, this involves an extensive number of treatment options, and is a complaint that carries with it a high risk of complications and morbidity. The emergent differential diagnosis prior to evaluation includes, but is not limited to,  URI, CHF, pericardial effusion/tamponade, arrhythmias, ACS, COPD, asthma, bronchitis, pneumonia, pneumothorax, PE, anemia    This is not an exhaustive differential.   Past Medical History / Co-morbidities / Social History:  has a past medical history of Asthma, Chronic cough, COVID-19 (09/18/2021), Fibroids, GERD (gastroesophageal reflux disease), Hypertension, OAB (overactive bladder), and UTI (urinary tract infection).  Additional  history: Chart reviewed. Pertinent results include: seen previously for asthma exacerbation. Had negative CTA PE in September 2024  Physical Exam: Physical exam performed. The pertinent findings include: mild expiratory wheezing present in the bilateral lung bases  Lab Tests: I ordered, and  personally interpreted labs.  The pertinent results include:  no acute laboratory abnormalities   Imaging Studies: I ordered imaging studies including CXR. I independently visualized and interpreted imaging which showed NAD. I agree with the radiologist interpretation.   Cardiac Monitoring:  The patient was maintained on a cardiac monitor.  My attending physician Dr. Rhunette Croft viewed and interpreted the cardiac monitored which showed an underlying rhythm of: no STEMI, sinus rhythm. I agree with this interpretation.   Medications: I ordered medication including duonebs, solu-medrol, magnesium  for asthma exacerbation. Reevaluation of the patient after these medicines showed that the patient improved. I have reviewed the patients home medicines and have made adjustments as needed.  Disposition: After consideration of the diagnostic results and the patients response to treatment, I feel that emergency department workup does not suggest an emergent condition requiring admission or immediate intervention beyond what has been performed at this time. The plan is: discharge with medrol dosepak for asthma exacerbation and Z pack given infectious symptoms with duration of symptoms and risk factors. Did consider further evaluation with CTA for PE/pneumonia rule out, however she has had multiple of this study in the last year which were negative and she is feeling better after medications. Shared decision making with the patient, will defer this imaging at this time. Also offered admission for asthma exacerbation, however patient declined admission and would prefer to manage symptoms at home. Evaluation and diagnostic  testing in the emergency department does not suggest an emergent condition requiring admission or immediate intervention beyond what has been performed at this time.  Plan for discharge with close PCP follow-up.  Patient is understanding and amenable with plan, educated on red flag symptoms that would prompt immediate return.  Patient discharged in stable condition.  Final Clinical Impression(s) / ED Diagnoses Final diagnoses:  Moderate persistent asthma with exacerbation  Viral URI with cough    Rx / DC Orders ED Discharge Orders          Ordered    methylPREDNISolone (MEDROL DOSEPAK) 4 MG TBPK tablet        09/23/23 1940    azithromycin (ZITHROMAX Z-PAK) 250 MG tablet        09/23/23 1940    benzonatate (TESSALON) 100 MG capsule  Every 8 hours        09/23/23 1940          An After Visit Summary was printed and given to the patient.     Vear Clock 09/23/23 Leilani Able, MD 09/23/23 [phone]

## 2023-09-23 NOTE — ED Triage Notes (Signed)
Asthma flare up for the last 2 weeks. Pt is currently on steroids for this and has been taking breathing tx at home with no relief. Exertional dyspnea. Productive cough with green mucus. C/o chest heaviness.

## 2023-09-23 NOTE — ED Provider Notes (Signed)
[name]    CSN: [id number] Arrival date & time: 09/23/23  1113      History   Chief Complaint Chief Complaint  Patient presents with   Shortness of Breath   Wheezing   Asthma Exacerbation    HPI Joyce Welch is a 52 y.o. female.   Patient presents to urgent care today for evaluation of cough and continued asthma exacerbation despite being on the fifth day of steroids from primary care provider.  She reports she does not seem to have any improvement in symptoms seem to be worsening.  She has had more shortness of breath and wheezing.  She did take a nebulizer treatment this morning about 3 hours before coming in office and states this has not been helpful.  She has not had any fever.  The history is provided by the patient.  Shortness of Breath Associated symptoms: cough and wheezing   Associated symptoms: no abdominal pain, no ear pain, no fever, no sore throat and no vomiting   Wheezing Associated symptoms: cough and shortness of breath   Associated symptoms: no ear pain, no fever and no sore throat     Past Medical History:  Diagnosis Date   Asthma    Chronic cough    COVID-19 09/18/2021   Fibroids    GERD (gastroesophageal reflux disease)    Hypertension    OAB (overactive bladder)    UTI (urinary tract infection)     Patient Active Problem List   Diagnosis Date Noted   Pulmonary nodule 10/04/2022   Lumbar spondylosis 09/22/2022   Neuropathic pain 09/22/2022   Lumbar radiculopathy 07/11/2022   Lumbar pain 06/12/2022   Allergic conjunctivitis of both eyes 02/22/2022   Perennial allergic rhinitis 02/22/2022   Gastroesophageal reflux disease 02/22/2022   Mild persistent asthma without complication 02/22/2022   Pain in joint of right hip 02/06/2022   Chest pain of uncertain etiology 12/06/2021   Heart murmur 12/06/2021   Effusion of right knee joint 10/24/2021   Pain in joint of right knee 10/24/2021   Pain in right knee 09/20/2021    Post-nasal drainage 08/30/2021   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   Carpal tunnel syndrome of right wrist 07/19/2021   Paresthesia 10/17/2018   Weakness 10/17/2018   Acute sinusitis 09/24/2018   Chronic maxillary sinusitis 08/08/2018   Chronic cough 08/08/2018   Chronic throat clearing 06/20/2018   Post-nasal drip 06/20/2018   Globus sensation 06/20/2018   Asthma exacerbation 10/31/2017   Leukocytosis 10/31/2017   Strain of quadriceps tendon 02/14/2014   Symptomatic menopausal or female climacteric states 07/31/2013    Past Surgical History:  Procedure Laterality Date   ABDOMINAL HYSTERECTOMY  02/05/2006   partial   CESAREAN SECTION  1994, 2002, 2005,    x 3   MAXILLARY ANTROSTOMY Right 10/07/2018   Procedure: RIGHT MAXILLARY ANTROSTOMY WITH TISSUE REMOVAL.;  Surgeon: Christia Reading, MD;  Location: [facility];  Service: ENT;  Laterality: Right;    OB History     Gravida  3   Para  3   Term  1   Preterm      AB      Living  3      SAB      IAB      Ectopic      Multiple      Live Births  3            Home Medications  Prior to Admission medications   Medication Sig Start Date End Date Taking? Authorizing Provider  albuterol (PROVENTIL) (2.5 MG/3ML) 0.083% nebulizer solution Take 3 mLs (2.5 mg total) by nebulization every 6 (six) hours as needed for wheezing or shortness of breath. 03/31/22  Yes [place], Acie Fredrickson, FNP  albuterol (VENTOLIN HFA) 108 (90 Base) MCG/ACT inhaler Inhale 1 puff into the lungs every 6 (six) hours as needed for wheezing or shortness of breath. 10/28/20  Yes Wallis Bamberg, PA-C  oxybutynin (DITROPAN) 5 MG tablet Take 5 mg by mouth 2 (two) times daily. 11/07/21  Yes [provider]  predniSONE (DELTASONE) 10 MG tablet Take 3 tablets (30 mg total) by mouth daily with breakfast. 08/11/23  Yes Wallis Bamberg, PA-C  ALPRAZolam Prudy Feeler) 0.25 MG tablet Take 0.25 mg by mouth as directed. 10/29/17   [provider]  amLODipine (NORVASC) 5 MG tablet Take 5 mg by mouth daily.    [provider]  azelastine (ASTELIN) 0.1 % nasal spray Place 1 spray into both nostrils 2 (two) times daily. Use in each nostril as directed Patient taking differently: Place 1 spray into both nostrils See admin instructions. Instill 1 spray into each nostril two times a day as directed 06/04/18   Coralyn Helling, MD  cetirizine (ZYRTEC ALLERGY) 10 MG tablet Take 1 tablet (10 mg total) by mouth daily. 08/11/23   Wallis Bamberg, PA-C  diclofenac Sodium (VOLTAREN) 1 % GEL Apply topically 4 (four) times daily.    [provider]  DULoxetine (CYMBALTA) 30 MG capsule Take 1 tablet by mouth daily.    [provider]  fluticasone (FLONASE) 50 MCG/ACT nasal spray Place 2 sprays into both nostrils daily. 06/09/23   [provider]  gabapentin (NEURONTIN) 100 MG capsule Take 100 mg by mouth daily at 6 (six) AM.    [provider]  ibuprofen (ADVIL) 600 MG tablet Take 1 tablet (600 mg total) by mouth every 6 (six) hours as needed. 12/03/22   Ellsworth Lennox, PA-C  ipratropium (ATROVENT) 0.03 % nasal spray Place 2 sprays into both nostrils 3 (three) times daily. 12/28/21   Verlee Monte, MD  loratadine (CLARITIN) 10 MG tablet Take 10 mg by mouth daily.    [provider]  meclizine (ANTIVERT) 12.5 MG tablet Take 1 tablet (12.5 mg total) by mouth 3 (three) times daily as needed for dizziness. 08/11/23   Wallis Bamberg, PA-C  methocarbamol (ROBAXIN) 500 MG tablet Take 1 tablet (500 mg total) by mouth 2 (two) times daily as needed for muscle spasms. 06/22/23   [place], Acie Fredrickson, FNP  mometasone-formoterol (DULERA) 100-5 MCG/ACT AERO Inhale 2 puffs into the lungs 2 (two) times daily. 05/31/22   Verlee Monte, MD  nitrofurantoin, macrocrystal-monohydrate, (MACROBID) 100 MG capsule Take 100 mg by mouth 2 (two) times daily. 06/26/23   [provider]  pantoprazole (PROTONIX) 40 MG tablet Take 1 tablet  (40 mg total) by mouth 2 (two) times daily. 02/22/22   Verlee Monte, MD  sucralfate (CARAFATE) 1 GM/10ML suspension Take 1 g by mouth 3 (three) times daily. 08/23/23   [provider]  tiZANidine (ZANAFLEX) 4 MG tablet Take 8 mg by mouth at bedtime as needed. 08/13/23   [provider]  Vitamin D, Ergocalciferol, (DRISDOL) 1.25 MG (50000 UT) CAPS capsule Take 50,000 Units by mouth once a week. 05/16/19   [provider]    Family History Family History  Problem Relation Age of Onset   Hypertension Mother  Other Father        unsure of medical history   Breast cancer Maternal Aunt    Allergic rhinitis Neg Hx    Angioedema Neg Hx    Asthma Neg Hx    Atopy Neg Hx    Eczema Neg Hx    Immunodeficiency Neg Hx    Urticaria Neg Hx     Social History Social History   Tobacco Use   Smoking status: Never    Passive exposure: Never   Smokeless tobacco: Never  Vaping Use   Vaping status: Never Used  Substance Use Topics   Alcohol use: Yes    Comment: occ   Drug use: Never     Allergies   Patient has no known allergies.   Review of Systems Review of Systems  Constitutional:  Negative for chills and fever.  HENT:  Positive for congestion. Negative for ear pain and sore throat.   Eyes:  Negative for discharge and redness.  Respiratory:  Positive for cough, shortness of breath and wheezing.   Gastrointestinal:  Negative for abdominal pain, diarrhea, nausea and vomiting.     Physical Exam Triage Vital Signs ED Triage Vitals  Encounter Vitals Group     BP 09/23/23 1122 122/71     Systolic BP Percentile --      Diastolic BP Percentile --      Pulse Rate 09/23/23 1122 87     Resp 09/23/23 1122 (!) 34     Temp 09/23/23 1122 98.1 F (36.7 C)     Temp Source 09/23/23 1122 Oral     SpO2 09/23/23 1122 98 %     Weight 09/23/23 1122 240 lb 1.3 oz (108.9 kg)     Height 09/23/23 1122 5\' 6"  (1.676 m)     Head Circumference --      Peak Flow --       Pain Score 09/23/23 1120 0     Pain Loc --      Pain Education --      Exclude from Growth Chart --    No data found.  Updated Vital Signs BP 122/71 (BP Location: Left Arm)   Pulse 87   Temp 98.1 F (36.7 C) (Oral)   Resp (!) 30   Ht 5\' 6"  (1.676 m)   Wt 240 lb 1.3 oz (108.9 kg)   SpO2 97%   BMI 38.75 kg/m   Visual Acuity Right Eye Distance:   Left Eye Distance:   Bilateral Distance:    Right Eye Near:   Left Eye Near:    Bilateral Near:     Physical Exam Vitals and nursing note reviewed.  Constitutional:      General: She is not in acute distress.    Appearance: Normal appearance. She is not ill-appearing.  HENT:     Head: Normocephalic and atraumatic.     Right Ear: Tympanic membrane normal.     Left Ear: Tympanic membrane normal.     Nose: Congestion present.     Mouth/Throat:     Mouth: Mucous membranes are moist.     Pharynx: No oropharyngeal exudate or posterior oropharyngeal erythema.  Eyes:     Conjunctiva/sclera: Conjunctivae normal.  Cardiovascular:     Rate and Rhythm: Normal rate and regular rhythm.     Heart sounds: Normal heart sounds. No murmur heard. Pulmonary:     Effort: Pulmonary effort is normal. No respiratory distress.     Breath sounds: Wheezing present. No rhonchi or  rales.     Comments: Mild tachypnea Skin:    General: Skin is warm and dry.  Neurological:     Mental Status: She is alert.  Psychiatric:        Mood and Affect: Mood normal.        Thought Content: Thought content normal.      UC Treatments / Results  Labs (all labs ordered are listed, but only abnormal results are displayed) Labs Reviewed - No data to display  EKG   Radiology No results found.  Procedures Procedures (including critical care time)  Medications Ordered in UC Medications  ipratropium-albuterol (DUONEB) 0.5-2.5 (3) MG/3ML nebulizer solution 3 mL (3 mLs Nebulization Given 09/23/23 1156)    Initial Impression / Assessment and Plan / UC  Course  I have reviewed the triage vital signs and the nursing notes.  Pertinent labs & imaging results that were available during my care of the patient were reviewed by me and considered in my medical decision making (see chart for details).    Given worsening symptoms rather than improvement with outpatient management recommended further evaluation in the emergency room.  Patient is agreeable to same and will have family member transport her via POV from our office.  Neb treatment administered with mild improvement before discharge.  Final Clinical Impressions(s) / UC Diagnoses   Final diagnoses:  Asthma with acute exacerbation, unspecified asthma severity, unspecified whether persistent   Discharge Instructions   None    ED Prescriptions   None    PDMP not reviewed this encounter.   Tomi Bamberger, PA-C 09/23/23 1304

## 2023-09-23 NOTE — ED Notes (Signed)
Ambulated pt, pt felt fine during the walk and slight SHOB towards the end of the walk. Pts SpO2 stay between 98%-100%. And HR stay between 88-98 until the end of the walk where the pt felt slightly short of breathe 98-102 HR.

## 2023-09-23 NOTE — ED Triage Notes (Signed)
"  Started last with Cough and Asthma flareup". "When it started my primary care provider gave me prednisone and cough medication but not helping". "The cough is now worse with sob/wheezing". Treatments "not helping". No fever.

## 2023-12-06 ENCOUNTER — Other Ambulatory Visit: Payer: Self-pay | Admitting: Internal Medicine

## 2023-12-06 DIAGNOSIS — Z1231 Encounter for screening mammogram for malignant neoplasm of breast: Secondary | ICD-10-CM

## 2024-01-07 ENCOUNTER — Ambulatory Visit
Admission: RE | Admit: 2024-01-07 | Discharge: 2024-01-07 | Disposition: A | Discharge status: Home / Self Care | Source: Ambulatory Visit

## 2024-01-07 VITALS — BP 136/85 | HR 101 | Temp 98.3°F | Resp 17

## 2024-01-07 DIAGNOSIS — M25532 Pain in left wrist: Secondary | ICD-10-CM | POA: Diagnosis not present

## 2024-01-07 MED ORDER — NAPROXEN 500 MG PO TABS: 500.0000 mg | ORAL_TABLET | Freq: Two times a day (BID) | ORAL | 0 refills | Status: AC

## 2024-01-07 MED ORDER — PREDNISONE 20 MG PO TABS
40.0000 mg | ORAL_TABLET | Freq: Every day | ORAL | 0 refills | Status: AC
Start: 2024-01-07 — End: 2024-01-12

## 2024-01-07 NOTE — Discharge Instructions (Addendum)
 The pain you're feeling in your wrist is most likely from a repetitive strain injury. This type of injury happens when the same muscles, tendons, or nerves are used too much or in the wrong way.   You've been given a better splint to help support your wrist. Please wear it all the time for the next week or so. You can take it off when you shower or wash your hands. It's also okay to take it off for a few minutes during the day to gently move your wrist and help prevent stiffness.  You have been prescribed medications to help with pain and inflammation. Make sure to take the medicine exactly as prescribed. Do not take over-the-counter aspirin, motrin, ibuprofen or aleve with these medications.   To reduce swelling, apply ice to your wrist at least three times a day for 20 minutes each time. Always place a towel or cloth between the ice and your skin. If your skin turns bright red or if you stop feeling the cold, remove the ice right away. This is very important to prevent injury to your skin.  Try to rest your wrist as much as possible for the next few days. Once you return to your usual activities or work, take frequent breaks from anything repetitive. If possible, use your other hand more often to avoid putting too much stress on your injured wrist.  If your symptoms don't improve after following these steps, or if the pain comes back later, you should follow up with an orthopedic specialist.

## 2024-01-07 NOTE — ED Triage Notes (Signed)
 Pt c/o left wrist pain since yesterday. Denies any injury

## 2024-01-07 NOTE — ED Provider Notes (Signed)
 [facility]    CSN: [id number] Arrival date & time: 01/07/24  1422      History   Chief Complaint Chief Complaint  Patient presents with   Hand Problem    Wrist hurting - Entered by patient    HPI Joyce Welch is a 53 y.o. female.   Subjective:   Joyce Welch is a 53 year old female who presents for evaluation and treatment of left wrist pain. The pain began acutely one day ago without any known injury or previous history of similar symptoms. She describes the pain as constant and notes that it worsens with certain movements. She denies any swelling, weakness, numbness, or difficulty bearing weight on the wrist. She is right-hand dominant. 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 She has not taken any medication or used other treatments for symptom relief prior to today's visit.  The following portions of the patient's history were reviewed and updated as appropriate: allergies, current medications, past family history, past medical history, past social history, past surgical history, and problem list.       Past Medical History:  Diagnosis Date   Asthma    Chronic cough    COVID-19 09/18/2021   Fibroids    GERD (gastroesophageal reflux disease)    Hypertension    OAB (overactive bladder)    UTI (urinary tract infection)     Patient Active Problem List   Diagnosis Date Noted   Pulmonary nodule 10/04/2022   Lumbar spondylosis 09/22/2022   Neuropathic pain 09/22/2022   Lumbar radiculopathy 07/11/2022   Lumbar pain 06/12/2022   Allergic conjunctivitis of both eyes 02/22/2022   Perennial allergic rhinitis 02/22/2022   Gastroesophageal reflux disease 02/22/2022   Mild persistent asthma without complication 02/22/2022   Pain in joint of right hip 02/06/2022   Chest pain of uncertain etiology 12/06/2021   Heart murmur 12/06/2021   Effusion of right knee joint 10/24/2021   Pain in joint of right knee 10/24/2021   Pain in right knee 09/20/2021    Post-nasal drainage 08/30/2021   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   Carpal tunnel syndrome of right wrist 07/19/2021   Paresthesia 10/17/2018   Weakness 10/17/2018   Acute sinusitis 09/24/2018   Chronic maxillary sinusitis 08/08/2018   Chronic cough 08/08/2018   Chronic throat clearing 06/20/2018   Post-nasal drip 06/20/2018   Globus sensation 06/20/2018   Asthma exacerbation 10/31/2017   Leukocytosis 10/31/2017   Strain of quadriceps tendon 02/14/2014   Symptomatic menopausal or female climacteric states 07/31/2013    Past Surgical History:  Procedure Laterality Date   ABDOMINAL HYSTERECTOMY  02/05/2006   partial   CESAREAN SECTION  1994, 2002, 2005,    x 3   MAXILLARY ANTROSTOMY Right 10/07/2018   Procedure: RIGHT MAXILLARY ANTROSTOMY WITH TISSUE REMOVAL.;  Surgeon: Christia Reading, MD;  Location: [facility];  Service: ENT;  Laterality: Right;    OB History     Gravida  3   Para  3   Term  1   Preterm      AB      Living  3      SAB      IAB      Ectopic      Multiple      Live Births  3            Home Medications    Prior to Admission medications   Medication Sig Start Date End Date  Taking? Authorizing Provider  DULERA 200-5 MCG/ACT AERO SMARTSIG:1 Puff(s) By Mouth Every 12 Hours 07/12/23  Yes [provider]  naproxen (NAPROSYN) 500 MG tablet Take 1 tablet (500 mg total) by mouth 2 (two) times daily for 10 days. 01/07/24 01/17/24 Yes Lurline Idol, FNP  predniSONE (DELTASONE) 20 MG tablet Take 2 tablets (40 mg total) by mouth daily with breakfast for 5 days. 01/07/24 01/12/24 Yes Lurline Idol, FNP  albuterol (PROVENTIL) (2.5 MG/3ML) 0.083% nebulizer solution Take 3 mLs (2.5 mg total) by nebulization every 6 (six) hours as needed for wheezing or shortness of breath. 03/31/22   Gustavus Bryant, FNP  albuterol (VENTOLIN HFA) 108 (90 Base) MCG/ACT inhaler Inhale 1 puff into the lungs every 6 (six) hours as needed  for wheezing or shortness of breath. 10/28/20   Wallis Bamberg, PA-C  ALPRAZolam Prudy Feeler) 0.25 MG tablet Take 0.25 mg by mouth as directed. 10/29/17   [provider]  amLODipine (NORVASC) 5 MG tablet Take 5 mg by mouth daily.    [provider]  azelastine (ASTELIN) 0.1 % nasal spray Place 1 spray into both nostrils 2 (two) times daily. Use in each nostril as directed Patient taking differently: Place 1 spray into both nostrils See admin instructions. Instill 1 spray into each nostril two times a day as directed 06/04/18   Coralyn Helling, MD  azithromycin (ZITHROMAX Z-PAK) 250 MG tablet Take 2 tablets on day 1 followed by 1 tablet on day 2-4 Patient not taking: Reported on 01/07/2024 09/23/23   Smoot, Shawn Route, PA-C  benzonatate (TESSALON) 100 MG capsule Take 1 capsule (100 mg total) by mouth every 8 (eight) hours. Patient not taking: Reported on 01/07/2024 09/23/23   Smoot, Shawn Route, PA-C  cetirizine (ZYRTEC ALLERGY) 10 MG tablet Take 1 tablet (10 mg total) by mouth daily. 08/11/23   Wallis Bamberg, PA-C  DULoxetine (CYMBALTA) 30 MG capsule Take 1 tablet by mouth daily.    [provider]  fluticasone (FLONASE) 50 MCG/ACT nasal spray Place 2 sprays into both nostrils daily. 06/09/23   [provider]  gabapentin (NEURONTIN) 100 MG capsule Take 100 mg by mouth daily at 6 (six) AM.    [provider]  ibuprofen (ADVIL) 600 MG tablet Take 1 tablet (600 mg total) by mouth every 6 (six) hours as needed. 12/03/22   Ellsworth Lennox, PA-C  ipratropium (ATROVENT) 0.03 % nasal spray Place 2 sprays into both nostrils 3 (three) times daily. 12/28/21   Verlee Monte, MD  loratadine (CLARITIN) 10 MG tablet Take 10 mg by mouth daily.    [provider]  meclizine (ANTIVERT) 12.5 MG tablet Take 1 tablet (12.5 mg total) by mouth 3 (three) times daily as needed for dizziness. 08/11/23   Wallis Bamberg, PA-C  methocarbamol (ROBAXIN) 500 MG tablet Take 1 tablet (500 mg total) by mouth 2 (two)  times daily as needed for muscle spasms. Patient not taking: Reported on 01/07/2024 06/22/23   Gustavus Bryant, FNP  mometasone-formoterol (DULERA) 100-5 MCG/ACT AERO Inhale 2 puffs into the lungs 2 (two) times daily. 05/31/22   Verlee Monte, MD  nitrofurantoin, macrocrystal-monohydrate, (MACROBID) 100 MG capsule Take 100 mg by mouth 2 (two) times daily. Patient not taking: Reported on 01/07/2024 06/26/23   [provider]  oxybutynin (DITROPAN) 5 MG tablet Take 5 mg by mouth 2 (two) times daily. 11/07/21   [provider]  pantoprazole (PROTONIX) 40 MG tablet Take 1 tablet (40 mg total) by mouth 2 (two)  times daily. 02/22/22   Verlee Monte, MD  sucralfate (CARAFATE) 1 GM/10ML suspension Take 1 g by mouth 3 (three) times daily. 08/23/23   [provider]  tiZANidine (ZANAFLEX) 4 MG tablet Take 8 mg by mouth at bedtime as needed. Patient not taking: Reported on 01/07/2024 08/13/23   [provider]  Vitamin D, Ergocalciferol, (DRISDOL) 1.25 MG (50000 UT) CAPS capsule Take 50,000 Units by mouth once a week. 05/16/19   [provider]    Family History Family History  Problem Relation Age of Onset   Hypertension Mother    Other Father        unsure of medical history   Breast cancer Maternal Aunt    Allergic rhinitis Neg Hx    Angioedema Neg Hx    Asthma Neg Hx    Atopy Neg Hx    Eczema Neg Hx    Immunodeficiency Neg Hx    Urticaria Neg Hx     Social History Social History   Tobacco Use   Smoking status: Never    Passive exposure: Never   Smokeless tobacco: Never  Vaping Use   Vaping status: Never Used  Substance Use Topics   Alcohol use: Yes    Comment: occ   Drug use: Never     Allergies   Patient has no known allergies.   Review of Systems Review of Systems  Musculoskeletal:  Positive for arthralgias. Negative for joint swelling.  Skin:  Negative for color change and wound.  Neurological:  Negative for weakness and numbness.   All other systems reviewed and are negative.    Physical Exam Triage Vital Signs ED Triage Vitals  Encounter Vitals Group     BP 01/07/24 1429 136/85     Systolic BP Percentile --      Diastolic BP Percentile --      Pulse Rate 01/07/24 1429 (!) 101     Resp 01/07/24 1429 17     Temp 01/07/24 1429 98.3 F (36.8 C)     Temp Source 01/07/24 1429 Oral     SpO2 --      Weight --      Height --      Head Circumference --      Peak Flow --      Pain Score 01/07/24 1432 8     Pain Loc --      Pain Education --      Exclude from Growth Chart --    No data found.  Updated Vital Signs BP 136/85 (BP Location: Right Arm)   Pulse (!) 101   Temp 98.3 F (36.8 C) (Oral)   Resp 17   Visual Acuity Right Eye Distance:   Left Eye Distance:   Bilateral Distance:    Right Eye Near:   Left Eye Near:    Bilateral Near:     Physical Exam Vitals reviewed.  Constitutional:      Appearance: Normal appearance.  Pulmonary:     Effort: Pulmonary effort is normal.  Musculoskeletal:        General: Normal range of motion.     Left forearm: Normal.     Left wrist: Tenderness present. No swelling, deformity, effusion, lacerations, bony tenderness, snuff box tenderness or crepitus. Normal range of motion. Normal pulse.     Left hand: Normal.     Cervical back: Normal range of motion and neck supple.  Skin:    General: Skin is warm and dry.  Neurological:  General: No focal deficit present.     Mental Status: She is alert and oriented to person, place, and time.      UC Treatments / Results  Labs (all labs ordered are listed, but only abnormal results are displayed) Labs Reviewed - No data to display  EKG   Radiology No results found.  Procedures Procedures (including critical care time)  Medications Ordered in UC Medications - No data to display  Initial Impression / Assessment and Plan / UC Course  I have reviewed the triage vital signs and the nursing  notes.  Pertinent labs & imaging results that were available during my care of the patient were reviewed by me and considered in my medical decision making (see chart for details).    53 year old female presenting with acute onset of left wrist pain without any history of trauma or injury. She denies swelling, weakness, numbness, or difficulty bearing weight, and has no prior history of similar symptoms. She is right-hand dominant. Physical exam is noted above and does not reveal any focal abnormalities. There are no red flag symptoms present to suggest a more concerning etiology. Imaging is currently unavailable but is not indicated at this time based on the clinical presentation. Supportive care is recommended, including a prescription for naproxen and a short course of prednisone, along with the use of a wrist splint for comfort and stabilization. The patient is advised to follow up with orthopedics if symptoms do not improve or if they resolve and then recur.  Today's evaluation has revealed no signs of a dangerous process. Discussed diagnosis with patient and/or guardian. Patient and/or guardian aware of their diagnosis, possible red flag symptoms to watch out for and need for close follow up. Patient and/or guardian understands verbal and written discharge instructions. Patient and/or guardian comfortable with plan and disposition.  Patient and/or guardian has a clear mental status at this time, good insight into illness (after discussion and teaching) and has clear judgment to make decisions regarding their care  Documentation was completed with the aid of voice recognition software. Transcription may contain typographical errors. Final Clinical Impressions(s) / UC Diagnoses   Final diagnoses:  Wrist pain, acute, left     Discharge Instructions      The pain you're feeling in your wrist is most likely from a repetitive strain injury. This type of injury happens when the same muscles,  tendons, or nerves are used too much or in the wrong way.   You've been given a better splint to help support your wrist. Please wear it all the time for the next week or so. You can take it off when you shower or wash your hands. It's also okay to take it off for a few minutes during the day to gently move your wrist and help prevent stiffness.  You have been prescribed medications to help with pain and inflammation. Make sure to take the medicine exactly as prescribed. Do not take over-the-counter aspirin, motrin, ibuprofen or aleve with these medications.   To reduce swelling, apply ice to your wrist at least three times a day for 20 minutes each time. Always place a towel or cloth between the ice and your skin. If your skin turns bright red or if you stop feeling the cold, remove the ice right away. This is very important to prevent injury to your skin.  Try to rest your wrist as much as possible for the next few days. Once you return to your usual activities or  work, take frequent breaks from anything repetitive. If possible, use your other hand more often to avoid putting too much stress on your injured wrist.  If your symptoms don't improve after following these steps, or if the pain comes back later, you should follow up with an orthopedic specialist.     ED Prescriptions     Medication Sig Dispense Auth. Provider   predniSONE (DELTASONE) 20 MG tablet Take 2 tablets (40 mg total) by mouth daily with breakfast for 5 days. 10 tablet Lurline Idol, FNP   naproxen (NAPROSYN) 500 MG tablet Take 1 tablet (500 mg total) by mouth 2 (two) times daily for 10 days. 20 tablet Lurline Idol, FNP      PDMP not reviewed this encounter.   Lurline Idol, [place] 01/07/24 1517

## 2024-01-16 ENCOUNTER — Ambulatory Visit
Admission: RE | Admit: 2024-01-16 | Discharge: 2024-01-16 | Disposition: A | Discharge status: Home / Self Care | Source: Ambulatory Visit | Attending: Internal Medicine | Admitting: Internal Medicine

## 2024-01-16 DIAGNOSIS — Z1231 Encounter for screening mammogram for malignant neoplasm of breast: Secondary | ICD-10-CM

## 2024-03-05 ENCOUNTER — Encounter: Payer: Self-pay | Admitting: Emergency Medicine

## 2024-03-05 ENCOUNTER — Ambulatory Visit
Admission: EM | Admit: 2024-03-05 | Discharge: 2024-03-05 | Disposition: A | Discharge status: Home / Self Care | Attending: Family Medicine | Admitting: Family Medicine

## 2024-03-05 DIAGNOSIS — R1013 Epigastric pain: Secondary | ICD-10-CM

## 2024-03-05 NOTE — ED Triage Notes (Signed)
 Pt presents c/o abdominal pain in center of abdominal area. Pt denies emesis and diarrhea.

## 2024-03-06 ENCOUNTER — Emergency Department (HOSPITAL_COMMUNITY)
Admission: EM | Admit: 2024-03-06 | Discharge: 2024-03-07 | Disposition: A | Discharge status: Home / Self Care | Attending: Emergency Medicine | Admitting: Emergency Medicine

## 2024-03-06 ENCOUNTER — Other Ambulatory Visit: Payer: Self-pay

## 2024-03-06 ENCOUNTER — Other Ambulatory Visit: Payer: Self-pay | Admitting: Gastroenterology

## 2024-03-06 DIAGNOSIS — Z7951 Long term (current) use of inhaled steroids: Secondary | ICD-10-CM | POA: Diagnosis not present

## 2024-03-06 DIAGNOSIS — R1084 Generalized abdominal pain: Secondary | ICD-10-CM | POA: Insufficient documentation

## 2024-03-06 DIAGNOSIS — Z79899 Other long term (current) drug therapy: Secondary | ICD-10-CM | POA: Diagnosis not present

## 2024-03-06 DIAGNOSIS — R109 Unspecified abdominal pain: Secondary | ICD-10-CM

## 2024-03-06 DIAGNOSIS — I10 Essential (primary) hypertension: Secondary | ICD-10-CM | POA: Insufficient documentation

## 2024-03-06 DIAGNOSIS — J45909 Unspecified asthma, uncomplicated: Secondary | ICD-10-CM | POA: Diagnosis not present

## 2024-03-06 LAB — CBC
HCT: 40.3 % (ref 36.0–46.0)
Hemoglobin: 12.3 g/dL (ref 12.0–15.0)
MCH: 24.8 pg — ABNORMAL LOW (ref 26.0–34.0)
MCHC: 30.5 g/dL (ref 30.0–36.0)
MCV: 81.3 fL (ref 80.0–100.0)
Platelets: 274 10*3/uL (ref 150–400)
RBC: 4.96 MIL/uL (ref 3.87–5.11)
RDW: 12.9 % (ref 11.5–15.5)
WBC: 9.1 10*3/uL (ref 4.0–10.5)
nRBC: 0 % (ref 0.0–0.2)

## 2024-03-06 LAB — COMPREHENSIVE METABOLIC PANEL WITH GFR
ALT: 19 U/L (ref 0–44)
AST: 26 U/L (ref 15–41)
Albumin: 3.4 g/dL — ABNORMAL LOW (ref 3.5–5.0)
Alkaline Phosphatase: 112 U/L (ref 38–126)
Anion gap: 8 (ref 5–15)
BUN: 12 mg/dL (ref 6–20)
CO2: 27 mmol/L (ref 22–32)
Calcium: 9.2 mg/dL (ref 8.9–10.3)
Chloride: 105 mmol/L (ref 98–111)
Creatinine, Ser: 0.99 mg/dL (ref 0.44–1.00)
GFR, Estimated: >60 mL/min (ref >60)
Glucose, Bld: 84 mg/dL (ref 70–99)
Potassium: 3.8 mmol/L (ref 3.5–5.1)
Sodium: 140 mmol/L (ref 135–145)
Total Bilirubin: 0.3 mg/dL (ref 0.0–1.2)
Total Protein: 7 g/dL (ref 6.5–8.1)

## 2024-03-06 LAB — LIPASE, BLOOD: Lipase: 27 U/L (ref 11–51)

## 2024-03-06 NOTE — ED Provider Notes (Signed)
 [facility]   [id number] 03/05/24 Arrival Time: 1802  ASSESSMENT & PLAN:  1. Epigastric pain    Unclear etiology. Reports worsening pain. Rec ED evaluation. Agrees. By POV. Stable upon discharge.     Follow-up Information     Go to  [facility] Emergency Department at [facility].   Specialty: Emergency Medicine Contact information: [address] [place]  [address] [phone]                 Reviewed expectations re: course of current medical issues. Questions answered. Outlined signs and symptoms indicating need for more acute intervention. Patient verbalized understanding. After Visit Summary given.   SUBJECTIVE: History from: patient. Joyce Welch is a 53 y.o. female who presents with complaint of non-radiating epigastric pain; over past week but mild symptoms over past monts. Has seen GI with endoscopy. "Told me it looked ok". Over past couple of days with sharp pain; worse than usu. Denies fever/n/v. No tx PTA. Is moving bowels. Does have h/o GERD; "but this feels different".   No LMP recorded. Patient has had a hysterectomy. Past Surgical History:  Procedure Laterality Date   ABDOMINAL HYSTERECTOMY  02/05/2006   partial   CESAREAN SECTION  1994, 2002, 2005,    x 3   MAXILLARY ANTROSTOMY Right 10/07/2018   Procedure: RIGHT MAXILLARY ANTROSTOMY WITH TISSUE REMOVAL.;  Surgeon: Virgina Grills, MD;  Location: [facility];  Service: ENT;  Laterality: Right;    OBJECTIVE:  Vitals:   03/05/24 1855 03/05/24 1856  BP:  126/80  Pulse:  88  Resp:  18  Temp:  97.6 F (36.4 C)  TempSrc:  Oral  SpO2:  96%  Weight: 108.8 kg     General appearance: alert, oriented, no acute distress HEENT: [place]; AT; oropharynx moist Lungs: unlabored respirations Abdomen: soft; without distention; does report TTP over epigastric abdomen;normal bowel sounds; without masses or organomegaly; without guarding or rebound  tenderness Back: without reported CVA tenderness; FROM at waist Extremities: without LE edema; symmetrical; without gross deformities Skin: warm and dry Neurologic: normal gait Psychological: alert and cooperative; normal mood and affect   No Known Allergies                                             Past Medical History:  Diagnosis Date   Asthma    Chronic cough    COVID-19 09/18/2021   Fibroids    GERD (gastroesophageal reflux disease)    Hypertension    OAB (overactive bladder)    UTI (urinary tract infection)     Social History   Socioeconomic History   Marital status: Single    Spouse name: Not on file   Number of children: 3   Years of education: 12   Highest education level: High school graduate  Occupational History   Occupation: Physiological scientist   Tobacco Use   Smoking status: Never    Passive exposure: Never   Smokeless tobacco: Never  Vaping Use   Vaping status: Never Used  Substance and Sexual Activity   Alcohol use: Yes    Comment: occ   Drug use: Never   Sexual activity: Not Currently    Partners: Male    Birth control/protection: Surgical  Other Topics Concern   Not on file  Social History Narrative   Lives at home with her  daughter.   Right-handed.   Caffeine  use:  3 cups daily.   Social Drivers of Corporate investment banker Strain: Not on file  Food Insecurity: Not on file  Transportation Needs: Not on file  Physical Activity: Not on file  Stress: Not on file  Social Connections: Unknown (09/04/2022)   Received from [facility]   Social Network    Social Network: Not on file  Intimate Partner Violence: Unknown (09/04/2022)   Received from [facility]   HITS    Physically Hurt: Not on file    Insult or Talk Down To: Not on file    Threaten Physical Harm: Not on file    Scream or Curse: Not on file    Family History  Problem Relation Age of Onset   Hypertension Mother    Other Father         unsure of medical history   Breast cancer Maternal Aunt    Allergic rhinitis Neg Hx    Angioedema Neg Hx    Asthma Neg Hx    Atopy Neg Hx    Eczema Neg Hx    Immunodeficiency Neg Hx    Urticaria Neg Hx      [name], MD 03/06/24 1037

## 2024-03-06 NOTE — ED Triage Notes (Signed)
 Patient reports mid/low abdominal and low back pain for several days , denies emesis or diarrhea , no dysuria or fever .

## 2024-03-07 ENCOUNTER — Emergency Department (HOSPITAL_COMMUNITY)

## 2024-03-07 ENCOUNTER — Encounter (HOSPITAL_COMMUNITY): Payer: Self-pay

## 2024-03-07 LAB — URINALYSIS, ROUTINE W REFLEX MICROSCOPIC
Bilirubin Urine: NEGATIVE
Glucose, UA: NEGATIVE mg/dL
Ketones, ur: NEGATIVE mg/dL
Leukocytes,Ua: NEGATIVE
Nitrite: NEGATIVE
Protein, ur: NEGATIVE mg/dL
Specific Gravity, Urine: 1.019 (ref 1.005–1.030)
pH: 5 (ref 5.0–8.0)

## 2024-03-07 LAB — TROPONIN I (HIGH SENSITIVITY): Troponin I (High Sensitivity): 8 ng/L (ref <18)

## 2024-03-07 MED ORDER — SUCRALFATE 1 G PO TABS
1.0000 g | ORAL_TABLET | Freq: Three times a day (TID) | ORAL | 0 refills | Status: AC
Start: 2024-03-07 — End: ?

## 2024-03-07 MED ORDER — POLYETHYLENE GLYCOL 3350 17 GM/SCOOP PO POWD: 17.0000 g | Freq: Every day | ORAL | 0 refills | Status: AC

## 2024-03-07 MED ORDER — ALUM & MAG HYDROXIDE-SIMETH 200-200-20 MG/5ML PO SUSP
30.0000 mL | Freq: Once | ORAL | Status: AC
  Administered 2024-03-07: 30 mL via ORAL
  Filled 2024-03-07: qty 30

## 2024-03-07 MED ORDER — IOHEXOL 350 MG/ML SOLN
75.0000 mL | Freq: Once | INTRAVENOUS | Status: AC | PRN
  Administered 2024-03-07: 75 mL via INTRAVENOUS

## 2024-03-07 MED ORDER — MORPHINE SULFATE (PF) 4 MG/ML IV SOLN
4.0000 mg | Freq: Once | INTRAVENOUS | Status: AC
  Administered 2024-03-07: 4 mg via INTRAVENOUS
  Filled 2024-03-07: qty 1

## 2024-03-07 NOTE — ED Provider Notes (Signed)
 [place] [facility] DE[facility]MENT AT [facility] Provider Note   CSN: [id number] Arrival date & time: 03/06/24  2210     History  Chief Complaint  Patient presents with   Abdominal Pain    Joyce Welch is a 53 y.o. female.  The history is provided by the patient and medical records. No language interpreter was used.  Abdominal Pain    53 year old female with history of asthma, GERD, hypertension, presenting with complaint of abdominal pain.  Patient report having recurrent upper abdominal pain ongoing for the past several months.  She described pain as a throbbing ache sensation to her upper abdomen usually brought on by eating but sometimes presents after eating.  For the past few days she also noticed some pain to her left lower back.  She does not endorse any significant fever or chills no worsening productive cough aside from her usual cough no urinary symptoms no change in her bowel movement her last bowel movement was yesterday.  She mention she has been seen and evaluated by her GI specialist in the past and has had endoscopy as well as colonoscopy.  She was told that her endoscopy that was done in March was very normal.  Previous colonoscopy shows 16 polyps that was benign.  She was told that she likely will need a abdominal pelvis CT scan done for further evaluation of her abdominal pain.  She does have 1 schedule 6 days from now.  She is here due to worsening pain despite taking medication that was previously prescribed.  Medication includes pantoprazole  as well as dicyclomine.  She is afraid of eating.  Patient however denies any significant history of alcohol abuse or regular NSAID use.  She denies any significant cardiac history.  She has had several surgeries in her abdomen in the past including 3 C-sections and a partial hysterectomy but otherwise she states she still has an intact appendix and intact gallbladder.  Currently she rates her pain as 8 out of 10.  Home  Medications Prior to Admission medications   Medication Sig Start Date End Date Taking? Authorizing Provider  AIRSUPRA 90-80 MCG/ACT AERO SMARTSIG:2 Puff(s) By Mouth Every 4 Hours PRN 10/05/23   [provider]  albuterol  (PROVENTIL ) (2.5 MG/3ML) 0.083% nebulizer solution Take 3 mLs (2.5 mg total) by nebulization every 6 (six) hours as needed for wheezing or shortness of breath. 03/31/22   Dodson Freestone, FNP  albuterol  (VENTOLIN  HFA) 108 (90 Base) MCG/ACT inhaler Inhale 1 puff into the lungs every 6 (six) hours as needed for wheezing or shortness of breath. 10/28/20   Adolph Hoop, PA-C  ALPRAZolam  (XANAX ) 0.25 MG tablet Take 0.25 mg by mouth as directed. 10/29/17   [provider]  amLODipine (NORVASC) 5 MG tablet Take 5 mg by mouth daily.    [provider]  azelastine  (ASTELIN ) 0.1 % nasal spray Place 1 spray into both nostrils 2 (two) times daily. Use in each nostril as directed Patient taking differently: Place 1 spray into both nostrils See admin instructions. Instill 1 spray into each nostril two times a day as directed 06/04/18   Sood, Vineet, MD  azithromycin  (ZITHROMAX  Z-PAK) 250 MG tablet Take 2 tablets on day 1 followed by 1 tablet on day 2-4 Patient not taking: Reported on 01/07/2024 09/23/23   Smoot, Genevive Ket, PA-C  benzonatate  (TESSALON ) 100 MG capsule Take 1 capsule (100 mg total) by mouth every 8 (eight) hours. Patient not taking: Reported on 01/07/2024 09/23/23  Smoot, Sarah A, PA-C  cetirizine  (ZYRTEC  ALLERGY ) 10 MG tablet Take 1 tablet (10 mg total) by mouth daily. 08/11/23   Adolph Hoop, PA-C  dicyclomine (BENTYL) 20 MG tablet Take 20 mg by mouth 3 (three) times daily as needed. 02/22/24   [provider]  DULERA  200-5 MCG/ACT AERO SMARTSIG:1 Puff(s) By Mouth Every 12 Hours 07/12/23   [provider]  DULoxetine (CYMBALTA) 30 MG capsule Take 1 tablet by mouth daily.    [provider]  fluticasone  (FLONASE ) 50 MCG/ACT nasal spray Place 2  sprays into both nostrils daily. 06/09/23   [provider]  gabapentin (NEURONTIN) 100 MG capsule Take 100 mg by mouth daily at 6 (six) AM.    [provider]  ibuprofen  (ADVIL ) 600 MG tablet Take 1 tablet (600 mg total) by mouth every 6 (six) hours as needed. 12/03/22   Gretel Leaven, PA-C  ipratropium (ATROVENT ) 0.03 % nasal spray Place 2 sprays into both nostrils 3 (three) times daily. 12/28/21   Sean Czar, MD  loratadine  (CLARITIN ) 10 MG tablet Take 10 mg by mouth daily.    [provider]  meclizine  (ANTIVERT ) 12.5 MG tablet Take 1 tablet (12.5 mg total) by mouth 3 (three) times daily as needed for dizziness. 08/11/23   Adolph Hoop, PA-C  methocarbamol  (ROBAXIN ) 500 MG tablet Take 1 tablet (500 mg total) by mouth 2 (two) times daily as needed for muscle spasms. Patient not taking: Reported on 01/07/2024 06/22/23   Dodson Freestone, FNP  mometasone -formoterol  (DULERA ) 100-5 MCG/ACT AERO Inhale 2 puffs into the lungs 2 (two) times daily. 05/31/22   Sean Czar, MD  montelukast  (SINGULAIR ) 10 MG tablet Take 10 mg by mouth at bedtime. 02/19/24   [provider]  nitrofurantoin , macrocrystal-monohydrate, (MACROBID ) 100 MG capsule Take 100 mg by mouth 2 (two) times daily. Patient not taking: Reported on 01/07/2024 06/26/23   [provider]  oxybutynin  (DITROPAN ) 5 MG tablet Take 5 mg by mouth 2 (two) times daily. 11/07/21   [provider]  oxybutynin  (DITROPAN -XL) 10 MG 24 hr tablet Take 10 mg by mouth daily. 10/15/23   [provider]  pantoprazole  (PROTONIX ) 40 MG tablet Take 1 tablet (40 mg total) by mouth 2 (two) times daily. 02/22/22   Sean Czar, MD  polyethylene glycol powder (GLYCOLAX /MIRALAX ) 17 GM/SCOOP powder SMARTSIG:17 Gram(s) By Mouth Daily PRN 11/08/23   [provider]  sucralfate  (CARAFATE ) 1 GM/10ML suspension Take 1 g by mouth 3 (three) times daily. 08/23/23   [provider]  tiZANidine (ZANAFLEX) 4 MG tablet  Take 8 mg by mouth at bedtime as needed. Patient not taking: Reported on 01/07/2024 08/13/23   [provider]  Vitamin D, Ergocalciferol, (DRISDOL) 1.25 MG (50000 UT) CAPS capsule Take 50,000 Units by mouth once a week. 05/16/19   [provider]      Allergies    Patient has no known allergies.    Review of Systems   Review of Systems  Gastrointestinal:  Positive for abdominal pain.  All other systems reviewed and are negative.   Physical Exam Updated Vital Signs BP (!) 152/64 (BP Location: Right Arm)   Pulse (!) 58   Temp 97.9 F (36.6 C)   Resp 16   SpO2 100%  Physical Exam Vitals and nursing note reviewed.  Constitutional:      General: She is not in acute distress.    Appearance: She is well-developed. She is obese.  HENT:  Head: Atraumatic.  Eyes:     Conjunctiva/sclera: Conjunctivae normal.  Cardiovascular:     Rate and Rhythm: Normal rate and regular rhythm.     Heart sounds: Normal heart sounds.  Pulmonary:     Effort: Pulmonary effort is normal.     Breath sounds: No wheezing, rhonchi or rales.  Abdominal:     Palpations: Abdomen is soft.     Tenderness: There is generalized abdominal tenderness and tenderness in the epigastric area. There is no right CVA tenderness, left CVA tenderness or guarding. Negative signs include Murphy's sign.     Hernia: No hernia is present.  Musculoskeletal:     Cervical back: Neck supple.  Skin:    Findings: No rash.  Neurological:     Mental Status: She is alert.  Psychiatric:        Mood and Affect: Mood normal.     ED Results / Procedures / Treatments   Labs (all labs ordered are listed, but only abnormal results are displayed) Labs Reviewed  COMPREHENSIVE METABOLIC PANEL WITH GFR - Abnormal; Notable for the following components:      Result Value   Albumin 3.4 (*)    All other components within normal limits  CBC - Abnormal; Notable for the following components:   MCH 24.8 (*)    All other  components within normal limits  URINALYSIS, ROUTINE W REFLEX MICROSCOPIC - Abnormal; Notable for the following components:   APPearance HAZY (*)    Hgb urine dipstick SMALL (*)    Bacteria, UA RARE (*)    All other components within normal limits  LIPASE, BLOOD  TROPONIN I (HIGH SENSITIVITY)  TROPONIN I (HIGH SENSITIVITY)    EKG None  Radiology CT ABDOMEN PELVIS W CONTRAST Result Date: 03/07/2024 CLINICAL DATA:  ABDOMINAL PAIN, ACUTE NONLOCALIZED EXAM: CT ABDOMEN AND PELVIS WITH CONTRAST TECHNIQUE: Multidetector CT imaging of the abdomen and pelvis was performed using the standard protocol following bolus administration of intravenous contrast. RADIATION DOSE REDUCTION: This exam was performed according to the departmental dose-optimization program which includes automated exposure control, adjustment of the mA and/or kV according to patient size and/or use of iterative reconstruction technique. CONTRAST:  75mL OMNIPAQUE  IOHEXOL  350 MG/ML SOLN COMPARISON:  August 14, 2013 FINDINGS: Lower chest: No infiltrates or consolidations, no pleural effusions Hepatobiliary: Liver normal size no masses no biliary dilatation. Gallbladder unremarkable. No gallstones. Pancreas: Pancreas normal size. No masses calcifications or inflammatory changes. Spleen: Spleen normal size.  No masses. Adrenals/Urinary Tract: Adrenal glands are normal size. Follow-up recommended. Kidneys are normal. No masses calcifications or hydronephrosis Stomach/Bowel: No small or large bowel obstruction or inflammatory changes. Moderate amount of residual fecal material throughout the colon without obstruction or constipation. Vascular/Lymphatic: No significant vascular findings are present. No enlarged abdominal or pelvic lymph nodes. Reproductive: .  No masses. Bladder unremarkable. Other: Anterior abdominal wall unremarkable without evidence of umbilical or inguinal hernias Musculoskeletal: Visualized portion of the thoracolumbar spine  and pelvic structures grossly unremarkable without evidence of fracture bony abnormalities or soft tissue masses. IMPRESSION: *No acute findings in the abdomen or pelvis. *Moderate amount of residual fecal material throughout the colon without obstruction or constipation. Electronically Signed   By: Fredrich Jefferson M.D.   On: 03/07/2024 11:22    Procedures Procedures    Medications Ordered in ED Medications  morphine  (PF) 4 MG/ML injection 4 mg (4 mg Intravenous Given 03/07/24 0917)  alum & mag hydroxide-simeth (MAALOX/MYLANTA) 200-200-20 MG/5ML suspension 30 mL (30 mLs Oral Given  03/07/24 0916)  iohexol  (OMNIPAQUE ) 350 MG/ML injection 75 mL (75 mLs Intravenous Contrast Given 03/07/24 1110)    ED Course/ Medical Decision Making/ A&P                                 Medical Decision Making Amount and/or Complexity of Data Reviewed Labs: ordered. Radiology: ordered. ECG/medicine tests: ordered.  Risk OTC drugs. Prescription drug management.   BP (!) 152/64 (BP Location: Right Arm)   Pulse (!) 58   Temp 97.9 F (36.6 C)   Resp 16   SpO2 100%   76:49 AM 53 year old female with history of asthma, GERD, hypertension, presenting with complaint of abdominal pain.  Patient report having recurrent upper abdominal pain ongoing for the past several months.  She described pain as a throbbing ache sensation to her upper abdomen usually brought on by eating but sometimes presents after eating.  For the past few days she also noticed some pain to her left lower back.  She does not endorse any significant fever or chills no worsening productive cough aside from her usual cough no urinary symptoms no change in her bowel movement her last bowel movement was yesterday.  She mention she has been seen and evaluated by her GI specialist in the past and has had endoscopy as well as colonoscopy.  She was told that her endoscopy that was done in March was very normal.  Previous colonoscopy shows 16 polyps that was  benign.  She was told that she likely will need a abdominal pelvis CT scan done for further evaluation of her abdominal pain.  She does have 1 schedule 6 days from now.  She is here due to worsening pain despite taking medication that was previously prescribed.  Medication includes pantoprazole  as well as dicyclomine.  She is afraid of eating.  Patient however denies any significant history of alcohol abuse or regular NSAID use.  She denies any significant cardiac history.  She has had several surgeries in her abdomen in the past including 3 C-sections and a partial hysterectomy but otherwise she states she still has an intact appendix and intact gallbladder.  Currently she rates her pain as 8 out of 10.  On exam this is an obese female laying in bed appears to be in no acute discomfort.  Heart with normal rate and rhythm, lungs are clear to auscultation bilaterally abdomen is diffusely tender without any focal point tenderness.  Bowel sounds are present.  No CVA tenderness.  Vital signs overall reassuring.  Blood pressure is 152/64.  Patient is mildly bradycardic with a heart rate of 58.  She is afebrile and no hypoxia.  Her BMI is 38.71.  -Labs ordered, independently viewed and interpreted by me.  Labs remarkable for unfortunately normal lipase, electrolyte panels are reassuring, normal WBC, normal H&H and a urinalysis does not show any signs of urinary tract infection -The patient was maintained on a cardiac monitor.  I personally viewed and interpreted the cardiac monitored which showed an underlying rhythm of: Sinus bradycardia -Imaging independently viewed and interpreted by me and I agree with radiologist's interpretation.  Result remarkable for abdominal pelvis CT scan obtained in ED reviewed interpreted by me which shows no concerning finding.  Patient does have some moderate stool burden therefore I will prescribe him MiraLAX  to help regulate.  Last bowel movement was yesterday. -This patient  presents to the ED for concern of abdominal pain,  this involves an extensive number of treatment options, and is a complaint that carries with it a high risk of complications and morbidity.  The differential diagnosis includes gastritis, GERD, PUD, pancreatitis, appendicitis, cholecystitis, bowel ischemia, colitis, diverticulitis -Co morbidities that complicate the patient evaluation includes GERD, asthma, hypertension -Treatment includes morphine  -Reevaluation of the patient after these medicines showed that the patient improved -PCP office notes or outside notes reviewed -Discussion with specialist GI specialist Dr. Cristina Donath -Escalation to admission/observation considered: patients feels much better, is comfortable with discharge, and will follow up with PCP -Prescription medication considered, patient comfortable with MiraLAX  and Carafate  -Social Determinant of Health considered          Final Clinical Impression(s) / ED Diagnoses Final diagnoses:  Generalized abdominal pain    Rx / DC Orders ED Discharge Orders          Ordered    polyethylene glycol powder (GLYCOLAX /MIRALAX ) 17 GM/SCOOP powder  Daily        03/07/24 1219    sucralfate  (CARAFATE ) 1 g tablet  3 times daily with meals & bedtime        03/07/24 1219              Debbra Fairy, PA-C 03/07/24 1221    Lind Repine, MD 03/08/24 [phone]

## 2024-03-07 NOTE — Discharge Instructions (Signed)
 You have been evaluated for your symptoms.  Fortunately CT scan today did not show any concerning finding.  Please take MiraLAX  to help regulate your bowel, take Carafate  as prescribed as it may soothe your stomach but most importantly follow-up closely with your GI specialist for outpatient evaluation and management.

## 2024-03-07 NOTE — ED Notes (Signed)
 Pt to CT scan.

## 2024-03-13 ENCOUNTER — Other Ambulatory Visit

## 2024-05-17 ENCOUNTER — Ambulatory Visit
Admission: EM | Admit: 2024-05-17 | Discharge: 2024-05-17 | Disposition: A | Discharge status: Home / Self Care | Attending: Family Medicine | Admitting: Family Medicine

## 2024-05-17 DIAGNOSIS — M545 Low back pain, unspecified: Secondary | ICD-10-CM | POA: Diagnosis present

## 2024-05-17 DIAGNOSIS — N3001 Acute cystitis with hematuria: Secondary | ICD-10-CM | POA: Diagnosis present

## 2024-05-17 DIAGNOSIS — R300 Dysuria: Secondary | ICD-10-CM | POA: Insufficient documentation

## 2024-05-17 LAB — POCT URINE DIPSTICK
Bilirubin, UA: NEGATIVE
Glucose, UA: NEGATIVE mg/dL
Ketones, POC UA: NEGATIVE mg/dL
Nitrite, UA: NEGATIVE
POC PROTEIN,UA: NEGATIVE
Spec Grav, UA: 1.025 (ref 1.010–1.025)
Urobilinogen, UA: 0.2 U/dL
pH, UA: 5.5 (ref 5.0–8.0)

## 2024-05-17 MED ORDER — CEPHALEXIN 500 MG PO CAPS: 500.0000 mg | ORAL_CAPSULE | Freq: Two times a day (BID) | ORAL | 0 refills | Status: DC

## 2024-05-17 NOTE — Discharge Instructions (Addendum)
Please start cephalexin to address an urinary tract infection. Make sure you hydrate very well with plain water and a quantity of 80 ounces of water a day.  Please limit drinks that are considered urinary irritants such as soda, sweet tea, coffee, energy drinks, alcohol.  These can worsen your urinary and genital symptoms but also be the source of them.  I will let you know about your urine culture results through MyChart to see if we need to prescribe or change your antibiotics based off of those results.  

## 2024-05-17 NOTE — ED Triage Notes (Signed)
 Pt reports low back pain x 1 week. Ibuprofen gives some relief. Pt think she may have an UTI.

## 2024-05-17 NOTE — ED Provider Notes (Signed)
 [facility]  Note:  This document was prepared using Conservation officer, historic buildings and may include unintentional dictation errors.  MRN: [id number] DOB: 05-08-1971  Subjective:   Joyce Welch is a 53 y.o. female presenting for 1 week history of urinary frequency, occasional dysuria, low back pain. Has been trying to hydrate better but she has not been drinking much recently. Has had UTIs in the past and this feels similar.  No falls, trauma, radicular symptoms, weakness, numbness or tingling.  No vaginal symptoms.  No current facility-administered medications for this encounter.  Current Outpatient Medications:    AIRSUPRA 90-80 MCG/ACT AERO, SMARTSIG:2 Puff(s) By Mouth Every 4 Hours PRN, Disp: , Rfl:    albuterol (PROVENTIL) (2.5 MG/3ML) 0.083% nebulizer solution, Take 3 mLs (2.5 mg total) by nebulization every 6 (six) hours as needed for wheezing or shortness of breath., Disp: 75 mL, Rfl: 0   albuterol (VENTOLIN HFA) 108 (90 Base) MCG/ACT inhaler, Inhale 1 puff into the lungs every 6 (six) hours as needed for wheezing or shortness of breath., Disp: 18 g, Rfl: 0   ALPRAZolam (XANAX) 0.25 MG tablet, Take 0.25 mg by mouth as directed., Disp: , Rfl:    amLODipine (NORVASC) 5 MG tablet, Take 5 mg by mouth daily., Disp: , Rfl:    azelastine (ASTELIN) 0.1 % nasal spray, Place 1 spray into both nostrils 2 (two) times daily. Use in each nostril as directed (Patient taking differently: Place 1 spray into both nostrils See admin instructions. Instill 1 spray into each nostril two times a day as directed), Disp: 30 mL, Rfl: 12   azithromycin (ZITHROMAX Z-PAK) 250 MG tablet, Take 2 tablets on day 1 followed by 1 tablet on day 2-4 (Patient not taking: Reported on 01/07/2024), Disp: 6 tablet, Rfl: 0   benzonatate (TESSALON) 100 MG capsule, Take 1 capsule (100 mg total) by mouth every 8 (eight) hours. (Patient not taking: Reported on 01/07/2024), Disp: 21 capsule, Rfl: 0    cetirizine (ZYRTEC ALLERGY) 10 MG tablet, Take 1 tablet (10 mg total) by mouth daily., Disp: 30 tablet, Rfl: 0   dicyclomine (BENTYL) 20 MG tablet, Take 20 mg by mouth 3 (three) times daily as needed., Disp: , Rfl:    DULERA 200-5 MCG/ACT AERO, SMARTSIG:1 Puff(s) By Mouth Every 12 Hours, Disp: , Rfl:    DULoxetine (CYMBALTA) 30 MG capsule, Take 1 tablet by mouth daily., Disp: , Rfl:    fluticasone (FLONASE) 50 MCG/ACT nasal spray, Place 2 sprays into both nostrils daily., Disp: , Rfl:    gabapentin (NEURONTIN) 100 MG capsule, Take 100 mg by mouth daily at 6 (six) AM., Disp: , Rfl:    ibuprofen (ADVIL) 600 MG tablet, Take 1 tablet (600 mg total) by mouth every 6 (six) hours as needed., Disp: 30 tablet, Rfl: 0   ipratropium (ATROVENT) 0.03 % nasal spray, Place 2 sprays into both nostrils 3 (three) times daily., Disp: 30 mL, Rfl: 12   loratadine (CLARITIN) 10 MG tablet, Take 10 mg by mouth daily., Disp: , Rfl:    meclizine (ANTIVERT) 12.5 MG tablet, Take 1 tablet (12.5 mg total) by mouth 3 (three) times daily as needed for dizziness., Disp: 30 tablet, Rfl: 0   methocarbamol (ROBAXIN) 500 MG tablet, Take 1 tablet (500 mg total) by mouth 2 (two) times daily as needed for muscle spasms. (Patient not taking: Reported on 01/07/2024), Disp: 20 tablet, Rfl: 0   mometasone-formoterol (DULERA) 100-5 MCG/ACT AERO, Inhale 2 puffs into the  lungs 2 (two) times daily., Disp: 13 g, Rfl: 4   montelukast (SINGULAIR) 10 MG tablet, Take 10 mg by mouth at bedtime., Disp: , Rfl:    nitrofurantoin, macrocrystal-monohydrate, (MACROBID) 100 MG capsule, Take 100 mg by mouth 2 (two) times daily. (Patient not taking: Reported on 01/07/2024), Disp: , Rfl:    oxybutynin (DITROPAN) 5 MG tablet, Take 5 mg by mouth 2 (two) times daily., Disp: , Rfl:    oxybutynin (DITROPAN-XL) 10 MG 24 hr tablet, Take 10 mg by mouth daily., Disp: , Rfl:    pantoprazole (PROTONIX) 40 MG tablet, Take 1 tablet (40 mg total) by mouth 2 (two) times daily.,  Disp: 60 tablet, Rfl: 3   polyethylene glycol powder (GLYCOLAX/MIRALAX) 17 GM/SCOOP powder, Take 17 g by mouth daily., Disp: 255 g, Rfl: 0   sucralfate (CARAFATE) 1 g tablet, Take 1 tablet (1 g total) by mouth 4 (four) times daily -  with meals and at bedtime., Disp: 30 tablet, Rfl: 0   tiZANidine (ZANAFLEX) 4 MG tablet, Take 8 mg by mouth at bedtime as needed. (Patient not taking: Reported on 01/07/2024), Disp: , Rfl:    Vitamin D, Ergocalciferol, (DRISDOL) 1.25 MG (50000 UT) CAPS capsule, Take 50,000 Units by mouth once a week., Disp: , Rfl:    No Known Allergies  Past Medical History:  Diagnosis Date   Asthma    Chronic cough    COVID-19 09/18/2021   Fibroids    GERD (gastroesophageal reflux disease)    Hypertension    OAB (overactive bladder)    UTI (urinary tract infection)      Past Surgical History:  Procedure Laterality Date   ABDOMINAL HYSTERECTOMY  02/05/2006   partial   CESAREAN SECTION  1994, 2002, 2005,    x 3   MAXILLARY ANTROSTOMY Right 10/07/2018   Procedure: RIGHT MAXILLARY ANTROSTOMY WITH TISSUE REMOVAL.;  Surgeon: Carlie Clark, MD;  Location: [facility];  Service: ENT;  Laterality: Right;    Family History  Problem Relation Age of Onset   Hypertension Mother    Other Father        unsure of medical history   Breast cancer Maternal Aunt    Allergic rhinitis Neg Hx    Angioedema Neg Hx    Asthma Neg Hx    Atopy Neg Hx    Eczema Neg Hx    Immunodeficiency Neg Hx    Urticaria Neg Hx     Social History   Tobacco Use   Smoking status: Never    Passive exposure: Never   Smokeless tobacco: Never  Vaping Use   Vaping status: Never Used  Substance Use Topics   Alcohol use: Yes    Comment: occ   Drug use: Never    ROS   Objective:   Vitals: BP 110/73 (BP Location: Left Wrist)   Pulse 82   Temp 97.6 F (36.4 C) (Oral)   Resp 18   SpO2 99%   Physical Exam Constitutional:      General: She is not in acute distress.     Appearance: Normal appearance. She is well-developed. She is not ill-appearing, toxic-appearing or diaphoretic.  HENT:     Head: Normocephalic and atraumatic.     Nose: Nose normal.     Mouth/Throat:     Mouth: Mucous membranes are moist.  Eyes:     General: No scleral icterus.       Right eye: No discharge.  Left eye: No discharge.     Extraocular Movements: Extraocular movements intact.     Conjunctiva/sclera: Conjunctivae normal.  Cardiovascular:     Rate and Rhythm: Normal rate.  Pulmonary:     Effort: Pulmonary effort is normal.  Abdominal:     General: Bowel sounds are normal. There is no distension.     Palpations: Abdomen is soft. There is no mass.     Tenderness: There is no abdominal tenderness. There is no right CVA tenderness, left CVA tenderness, guarding or rebound.  Musculoskeletal:     Lumbar back: Tenderness (mild lower paraspinal muscles of the lumbar region bilaterally) present. No swelling, edema, deformity, signs of trauma, lacerations, spasms or bony tenderness. Normal range of motion. Negative right straight leg raise test and negative left straight leg raise test. No scoliosis.  Skin:    General: Skin is warm and dry.  Neurological:     General: No focal deficit present.     Mental Status: She is alert and oriented to person, place, and time.  Psychiatric:        Mood and Affect: Mood normal.        Behavior: Behavior normal.        Thought Content: Thought content normal.        Judgment: Judgment normal.     Results for orders placed or performed during the hospital encounter of 05/17/24 (from the past 24 hours)  POCT URINE DIPSTICK     Status: Abnormal   Collection Time: 05/17/24 11:36 AM  Result Value Ref Range   Color, UA yellow yellow   Clarity, UA hazy (A) clear   Glucose, UA negative negative mg/dL   Bilirubin, UA negative negative   Ketones, POC UA negative negative mg/dL   Spec Grav, UA 8.974 8.989 - 1.025   Blood, UA small (A)  negative   pH, UA 5.5 5.0 - 8.0   POC PROTEIN,UA negative negative, trace   Urobilinogen, UA 0.2 0.2 or 1.0 E.U./dL   Nitrite, UA Negative Negative   Leukocytes, UA Trace (A) Negative    Assessment and Plan :   PDMP not reviewed this encounter.  1. Acute cystitis with hematuria   2. Dysuria   3. Acute bilateral low back pain without sciatica      Start Keflex to cover for acute cystitis, urine culture pending.  Recommended aggressive hydration, limiting urinary irritants.  Tylenol or ibuprofen for low back pains.  Counseled patient on potential for adverse effects with medications prescribed/recommended today, ER and return-to-clinic precautions discussed, patient verbalized understanding.    Christopher Savannah, [place] 05/17/24 [id number]

## 2024-05-18 LAB — URINE CULTURE

## 2024-05-21 ENCOUNTER — Ambulatory Visit (HOSPITAL_COMMUNITY): Payer: Self-pay

## 2024-06-15 ENCOUNTER — Ambulatory Visit (INDEPENDENT_AMBULATORY_CARE_PROVIDER_SITE_OTHER)

## 2024-06-15 ENCOUNTER — Ambulatory Visit: Payer: Self-pay | Admitting: Urgent Care

## 2024-06-15 ENCOUNTER — Ambulatory Visit
Admission: EM | Admit: 2024-06-15 | Discharge: 2024-06-15 | Disposition: A | Discharge status: Home / Self Care | Attending: Family Medicine | Admitting: Family Medicine

## 2024-06-15 DIAGNOSIS — R350 Frequency of micturition: Secondary | ICD-10-CM | POA: Diagnosis present

## 2024-06-15 DIAGNOSIS — M25561 Pain in right knee: Secondary | ICD-10-CM | POA: Diagnosis present

## 2024-06-15 DIAGNOSIS — M545 Low back pain, unspecified: Secondary | ICD-10-CM | POA: Insufficient documentation

## 2024-06-15 DIAGNOSIS — M51369 Other intervertebral disc degeneration, lumbar region without mention of lumbar back pain or lower extremity pain: Secondary | ICD-10-CM | POA: Insufficient documentation

## 2024-06-15 LAB — POCT URINE DIPSTICK
Bilirubin, UA: NEGATIVE
Glucose, UA: NEGATIVE mg/dL
Ketones, POC UA: NEGATIVE mg/dL
Leukocytes, UA: NEGATIVE
Nitrite, UA: NEGATIVE
POC PROTEIN,UA: NEGATIVE
Spec Grav, UA: 1.02 (ref 1.010–1.025)
Urobilinogen, UA: 0.2 U/dL
pH, UA: 5.5 (ref 5.0–8.0)

## 2024-06-15 MED ORDER — PREDNISONE 20 MG PO TABS
ORAL_TABLET | ORAL | 0 refills | Status: DC
Start: 2024-06-15 — End: 2024-06-27

## 2024-06-15 NOTE — ED Provider Notes (Signed)
 [facility]  Note:  This document was prepared using Conservation officer, historic buildings and may include unintentional dictation errors.  MRN: [id number] DOB: 26-Jul-1971  Subjective:   Joyce Welch is a 53 y.o. female presenting for 2-week history of urinary frequency, lower back pain, right knee pain with swelling and right ankle swelling.  No fall, trauma.  No dysuria, hematuria, vaginal discharge, pelvic pain.  Has well-established history of arthritis in her low back.  Has been using Tylenol  and ibuprofen  with minimal relief.  Takes oxybutynin .  Drinks ginger ale, fruit juices.  She is try to cut back on her coffee.  No current facility-administered medications for this encounter.  Current Outpatient Medications:  .  AIRSUPRA 90-80 MCG/ACT AERO, SMARTSIG:2 Puff(s) By Mouth Every 4 Hours PRN, Disp: , Rfl:  .  albuterol  (PROVENTIL ) (2.5 MG/3ML) 0.083% nebulizer solution, Take 3 mLs (2.5 mg total) by nebulization every 6 (six) hours as needed for wheezing or shortness of breath., Disp: 75 mL, Rfl: 0 .  albuterol  (VENTOLIN  HFA) 108 (90 Base) MCG/ACT inhaler, Inhale 1 puff into the lungs every 6 (six) hours as needed for wheezing or shortness of breath., Disp: 18 g, Rfl: 0 .  ALPRAZolam  (XANAX ) 0.25 MG tablet, Take 0.25 mg by mouth as directed., Disp: , Rfl:  .  amLODipine (NORVASC) 5 MG tablet, Take 5 mg by mouth daily., Disp: , Rfl:  .  azelastine  (ASTELIN ) 0.1 % nasal spray, Place 1 spray into both nostrils 2 (two) times daily. Use in each nostril as directed (Patient taking differently: Place 1 spray into both nostrils See admin instructions. Instill 1 spray into each nostril two times a day as directed), Disp: 30 mL, Rfl: 12 .  benzonatate  (TESSALON ) 100 MG capsule, Take 1 capsule (100 mg total) by mouth every 8 (eight) hours. (Patient not taking: Reported on 01/07/2024), Disp: 21 capsule, Rfl: 0 .  cephALEXin  (KEFLEX ) 500 MG capsule, Take 1 capsule (500 mg total)  by mouth 2 (two) times daily., Disp: 10 capsule, Rfl: 0 .  cetirizine  (ZYRTEC  ALLERGY ) 10 MG tablet, Take 1 tablet (10 mg total) by mouth daily., Disp: 30 tablet, Rfl: 0 .  dicyclomine (BENTYL) 20 MG tablet, Take 20 mg by mouth 3 (three) times daily as needed., Disp: , Rfl:  .  DULERA  200-5 MCG/ACT AERO, SMARTSIG:1 Puff(s) By Mouth Every 12 Hours, Disp: , Rfl:  .  DULoxetine (CYMBALTA) 30 MG capsule, Take 1 tablet by mouth daily., Disp: , Rfl:  .  fluticasone  (FLONASE ) 50 MCG/ACT nasal spray, Place 2 sprays into both nostrils daily., Disp: , Rfl:  .  gabapentin (NEURONTIN) 100 MG capsule, Take 100 mg by mouth daily at 6 (six) AM., Disp: , Rfl:  .  ibuprofen  (ADVIL ) 600 MG tablet, Take 1 tablet (600 mg total) by mouth every 6 (six) hours as needed., Disp: 30 tablet, Rfl: 0 .  ipratropium (ATROVENT ) 0.03 % nasal spray, Place 2 sprays into both nostrils 3 (three) times daily., Disp: 30 mL, Rfl: 12 .  loratadine  (CLARITIN ) 10 MG tablet, Take 10 mg by mouth daily., Disp: , Rfl:  .  meclizine  (ANTIVERT ) 12.5 MG tablet, Take 1 tablet (12.5 mg total) by mouth 3 (three) times daily as needed for dizziness., Disp: 30 tablet, Rfl: 0 .  methocarbamol  (ROBAXIN ) 500 MG tablet, Take 1 tablet (500 mg total) by mouth 2 (two) times daily as needed for muscle spasms. (Patient not taking: Reported on 01/07/2024), Disp: 20 tablet, Rfl: 0 .  mometasone -formoterol  (DULERA ) 100-5 MCG/ACT AERO, Inhale 2 puffs into the lungs 2 (two) times daily., Disp: 13 g, Rfl: 4 .  montelukast  (SINGULAIR ) 10 MG tablet, Take 10 mg by mouth at bedtime., Disp: , Rfl:  .  oxybutynin  (DITROPAN ) 5 MG tablet, Take 5 mg by mouth 2 (two) times daily., Disp: , Rfl:  .  oxybutynin  (DITROPAN -XL) 10 MG 24 hr tablet, Take 10 mg by mouth daily., Disp: , Rfl:  .  pantoprazole  (PROTONIX ) 40 MG tablet, Take 1 tablet (40 mg total) by mouth 2 (two) times daily., Disp: 60 tablet, Rfl: 3 .  polyethylene glycol powder (GLYCOLAX /MIRALAX ) 17 GM/SCOOP powder, Take  17 g by mouth daily., Disp: 255 g, Rfl: 0 .  sucralfate  (CARAFATE ) 1 g tablet, Take 1 tablet (1 g total) by mouth 4 (four) times daily -  with meals and at bedtime., Disp: 30 tablet, Rfl: 0 .  tiZANidine (ZANAFLEX) 4 MG tablet, Take 8 mg by mouth at bedtime as needed. (Patient not taking: Reported on 01/07/2024), Disp: , Rfl:  .  Vitamin D, Ergocalciferol, (DRISDOL) 1.25 MG (50000 UT) CAPS capsule, Take 50,000 Units by mouth once a week., Disp: , Rfl:    No Known Allergies  Past Medical History:  Diagnosis Date  . Asthma   . Chronic cough   . COVID-19 09/18/2021  . Fibroids   . GERD (gastroesophageal reflux disease)   . Hypertension   . OAB (overactive bladder)   . UTI (urinary tract infection)      Past Surgical History:  Procedure Laterality Date  . ABDOMINAL HYSTERECTOMY  02/05/2006   partial  . CESAREAN SECTION  1994, 2002, 2005,    x 3  . MAXILLARY ANTROSTOMY Right 10/07/2018   Procedure: RIGHT MAXILLARY ANTROSTOMY WITH TISSUE REMOVAL.;  Surgeon: Carlie Clark, MD;  Location: [facility];  Service: ENT;  Laterality: Right;    Family History  Problem Relation Age of Onset  . Hypertension Mother   . Other Father        unsure of medical history  . Breast cancer Maternal Aunt   . Allergic rhinitis Neg Hx   . Angioedema Neg Hx   . Asthma Neg Hx   . Atopy Neg Hx   . Eczema Neg Hx   . Immunodeficiency Neg Hx   . Urticaria Neg Hx     Social History   Tobacco Use  . Smoking status: Never    Passive exposure: Never  . Smokeless tobacco: Never  Vaping Use  . Vaping status: Never Used  Substance Use Topics  . Alcohol use: Yes    Comment: occ  . Drug use: Never    ROS   Objective:   Vitals: BP (!) 134/90 (BP Location: Right Arm)   Pulse 72   Temp 97.9 F (36.6 C) (Oral)   Resp 20   SpO2 98%   Physical Exam Constitutional:      General: She is not in acute distress.    Appearance: Normal appearance. She is well-developed. She is not  ill-appearing, toxic-appearing or diaphoretic.  HENT:     Head: Normocephalic and atraumatic.     Nose: Nose normal.     Mouth/Throat:     Mouth: Mucous membranes are moist.     Pharynx: Oropharynx is clear.  Eyes:     General: No scleral icterus.       Right eye: No discharge.        Left eye: No discharge.  Extraocular Movements: Extraocular movements intact.     Conjunctiva/sclera: Conjunctivae normal.  Cardiovascular:     Rate and Rhythm: Normal rate.  Pulmonary:     Effort: Pulmonary effort is normal.  Abdominal:     General: Bowel sounds are normal. There is no distension.     Palpations: Abdomen is soft. There is no mass.     Tenderness: There is no abdominal tenderness. There is no right CVA tenderness, left CVA tenderness, guarding or rebound.  Musculoskeletal:     Lumbar back: Spasms and tenderness present. No swelling, edema, deformity, signs of trauma, lacerations or bony tenderness. Normal range of motion. Negative right straight leg raise test and negative left straight leg raise test. No scoliosis.     Right knee: Swelling present. No deformity, effusion, erythema, ecchymosis, lacerations, bony tenderness or crepitus. Normal range of motion. No tenderness. Normal alignment and normal patellar mobility.  Skin:    General: Skin is warm and dry.  Neurological:     General: No focal deficit present.     Mental Status: She is alert and oriented to person, place, and time.  Psychiatric:        Mood and Affect: Mood normal.        Behavior: Behavior normal.        Thought Content: Thought content normal.        Judgment: Judgment normal.     Results for orders placed or performed during the hospital encounter of 06/15/24 (from the past 24 hours)  POCT URINE DIPSTICK     Status: Abnormal   Collection Time: 06/15/24 11:19 AM  Result Value Ref Range   Color, UA yellow yellow   Clarity, UA hazy (A) clear   Glucose, UA negative negative mg/dL   Bilirubin, UA negative  negative   Ketones, POC UA negative negative mg/dL   Spec Grav, UA 8.979 8.989 - 1.025   Blood, UA moderate (A) negative   pH, UA 5.5 5.0 - 8.0   POC PROTEIN,UA negative negative, trace   Urobilinogen, UA 0.2 0.2 or 1.0 E.U./dL   Nitrite, UA Negative Negative   Leukocytes, UA Negative Negative   DG Knee Complete 4 Views Right Result Date: 06/15/2024 CLINICAL DATA:  Right knee pain EXAM: RIGHT KNEE - COMPLETE 4+ VIEW COMPARISON:  09/20/2021 FINDINGS: Tricompartmental spurring with medial compartmental articular space narrowing compatible with moderate osteoarthritis. There is also spurring at the quadriceps and patellar tendon attachment sites to the patella. No knee effusion. No fracture identified. Chronic cortical thickening medially along the proximal tibial metadiaphysis, in the vicinity of the popliteus muscle insertion. IMPRESSION: 1. Moderate osteoarthritis. No acute findings. Electronically Signed   By: Ryan Salvage M.D.   On: 06/15/2024 11:55    Assessment and Plan :   PDMP not reviewed this encounter.  1. Acute pain of right knee   2. Urinary frequency   3. Acute bilateral low back pain without sciatica   4. Degeneration of intervertebral disc of lumbar region, unspecified whether pain present    Urine culture pending, recommended hydrating well and consistently, avoidance of urinary irritants.  Maintain oxybutynin .  For her low back pain, right knee pain and her underlying arthritis, recommended an oral prednisone  course.  Follow-up with an orthopedist soon as possible.  Counseled patient on potential for adverse effects with medications prescribed/recommended today, ER and return-to-clinic precautions discussed, patient verbalized understanding.    Christopher Savannah, [place] 06/15/24 [id number]

## 2024-06-15 NOTE — ED Triage Notes (Addendum)
 Pt c/o lower back pain and frequent urination. The patient reports she now has swelling to her right knee and ankle. The patient does not recall doing anything to cause the swelling.  Home interventions: tylenol , ibuprofen    Started: 2 weeks ago

## 2024-06-16 LAB — URINE CULTURE: Culture: NO GROWTH

## 2024-06-27 ENCOUNTER — Ambulatory Visit
Admission: RE | Admit: 2024-06-27 | Discharge: 2024-06-27 | Disposition: A | Discharge status: Home / Self Care | Source: Ambulatory Visit | Attending: Family Medicine | Admitting: Family Medicine

## 2024-06-27 ENCOUNTER — Ambulatory Visit (INDEPENDENT_AMBULATORY_CARE_PROVIDER_SITE_OTHER)

## 2024-06-27 ENCOUNTER — Ambulatory Visit: Payer: Self-pay | Admitting: Urgent Care

## 2024-06-27 DIAGNOSIS — M25571 Pain in right ankle and joints of right foot: Secondary | ICD-10-CM | POA: Diagnosis not present

## 2024-06-27 DIAGNOSIS — M25471 Effusion, right ankle: Secondary | ICD-10-CM | POA: Diagnosis not present

## 2024-06-27 MED ORDER — PREDNISONE 20 MG PO TABS: ORAL_TABLET | ORAL | 0 refills | Status: DC

## 2024-06-27 NOTE — ED Triage Notes (Signed)
 Pt c/o pain/swelling to right ankle x 2 weeks-denies injury-no pain meds PTA-NAD-slow gait

## 2024-06-27 NOTE — ED Provider Notes (Signed)
 [facility]  Note:  This document was prepared using Conservation officer, historic buildings and may include unintentional dictation errors.  MRN: [id number] DOB: Sep 14, 1971  Subjective:   Joyce Welch is a 53 y.o. female presenting for 2-week history of persistent and progressive worsening right ankle pain with swelling.  No fall, trauma, redness, warmth, wounds.  No history of gout.  Has not established with an orthopedist.  No current facility-administered medications for this encounter.  Current Outpatient Medications:    AIRSUPRA 90-80 MCG/ACT AERO, SMARTSIG:2 Puff(s) By Mouth Every 4 Hours PRN, Disp: , Rfl:    albuterol  (PROVENTIL ) (2.5 MG/3ML) 0.083% nebulizer solution, Take 3 mLs (2.5 mg total) by nebulization every 6 (six) hours as needed for wheezing or shortness of breath., Disp: 75 mL, Rfl: 0   albuterol  (VENTOLIN  HFA) 108 (90 Base) MCG/ACT inhaler, Inhale 1 puff into the lungs every 6 (six) hours as needed for wheezing or shortness of breath., Disp: 18 g, Rfl: 0   ALPRAZolam  (XANAX ) 0.25 MG tablet, Take 0.25 mg by mouth as directed., Disp: , Rfl:    amLODipine (NORVASC) 5 MG tablet, Take 5 mg by mouth daily., Disp: , Rfl:    azelastine  (ASTELIN ) 0.1 % nasal spray, Place 1 spray into both nostrils 2 (two) times daily. Use in each nostril as directed (Patient taking differently: Place 1 spray into both nostrils See admin instructions. Instill 1 spray into each nostril two times a day as directed), Disp: 30 mL, Rfl: 12   benzonatate  (TESSALON ) 100 MG capsule, Take 1 capsule (100 mg total) by mouth every 8 (eight) hours. (Patient not taking: Reported on 01/07/2024), Disp: 21 capsule, Rfl: 0   cephALEXin  (KEFLEX ) 500 MG capsule, Take 1 capsule (500 mg total) by mouth 2 (two) times daily., Disp: 10 capsule, Rfl: 0   cetirizine  (ZYRTEC  ALLERGY ) 10 MG tablet, Take 1 tablet (10 mg total) by mouth daily., Disp: 30 tablet, Rfl: 0   dicyclomine (BENTYL) 20 MG tablet,  Take 20 mg by mouth 3 (three) times daily as needed., Disp: , Rfl:    DULERA  200-5 MCG/ACT AERO, SMARTSIG:1 Puff(s) By Mouth Every 12 Hours, Disp: , Rfl:    DULoxetine (CYMBALTA) 30 MG capsule, Take 1 tablet by mouth daily., Disp: , Rfl:    fluticasone  (FLONASE ) 50 MCG/ACT nasal spray, Place 2 sprays into both nostrils daily., Disp: , Rfl:    gabapentin (NEURONTIN) 100 MG capsule, Take 100 mg by mouth daily at 6 (six) AM., Disp: , Rfl:    ibuprofen  (ADVIL ) 600 MG tablet, Take 1 tablet (600 mg total) by mouth every 6 (six) hours as needed., Disp: 30 tablet, Rfl: 0   ipratropium (ATROVENT ) 0.03 % nasal spray, Place 2 sprays into both nostrils 3 (three) times daily., Disp: 30 mL, Rfl: 12   loratadine  (CLARITIN ) 10 MG tablet, Take 10 mg by mouth daily., Disp: , Rfl:    meclizine  (ANTIVERT ) 12.5 MG tablet, Take 1 tablet (12.5 mg total) by mouth 3 (three) times daily as needed for dizziness., Disp: 30 tablet, Rfl: 0   methocarbamol  (ROBAXIN ) 500 MG tablet, Take 1 tablet (500 mg total) by mouth 2 (two) times daily as needed for muscle spasms. (Patient not taking: Reported on 01/07/2024), Disp: 20 tablet, Rfl: 0   mometasone -formoterol  (DULERA ) 100-5 MCG/ACT AERO, Inhale 2 puffs into the lungs 2 (two) times daily., Disp: 13 g, Rfl: 4   montelukast  (SINGULAIR ) 10 MG tablet, Take 10 mg by mouth at bedtime., Disp: ,  Rfl:    oxybutynin  (DITROPAN ) 5 MG tablet, Take 5 mg by mouth 2 (two) times daily., Disp: , Rfl:    oxybutynin  (DITROPAN -XL) 10 MG 24 hr tablet, Take 10 mg by mouth daily., Disp: , Rfl:    pantoprazole  (PROTONIX ) 40 MG tablet, Take 1 tablet (40 mg total) by mouth 2 (two) times daily., Disp: 60 tablet, Rfl: 3   polyethylene glycol powder (GLYCOLAX /MIRALAX ) 17 GM/SCOOP powder, Take 17 g by mouth daily., Disp: 255 g, Rfl: 0   predniSONE  (DELTASONE ) 20 MG tablet, Take 2 tablets daily with breakfast., Disp: 10 tablet, Rfl: 0   sucralfate  (CARAFATE ) 1 g tablet, Take 1 tablet (1 g total) by mouth 4 (four)  times daily -  with meals and at bedtime., Disp: 30 tablet, Rfl: 0   tiZANidine (ZANAFLEX) 4 MG tablet, Take 8 mg by mouth at bedtime as needed. (Patient not taking: Reported on 01/07/2024), Disp: , Rfl:    Vitamin D, Ergocalciferol, (DRISDOL) 1.25 MG (50000 UT) CAPS capsule, Take 50,000 Units by mouth once a week., Disp: , Rfl:    No Known Allergies  Past Medical History:  Diagnosis Date   Asthma    Chronic cough    COVID-19 09/18/2021   Fibroids    GERD (gastroesophageal reflux disease)    Hypertension    OAB (overactive bladder)    UTI (urinary tract infection)      Past Surgical History:  Procedure Laterality Date   ABDOMINAL HYSTERECTOMY  02/05/2006   partial   CESAREAN SECTION  1994, 2002, 2005,    x 3   MAXILLARY ANTROSTOMY Right 10/07/2018   Procedure: RIGHT MAXILLARY ANTROSTOMY WITH TISSUE REMOVAL.;  Surgeon: Carlie Clark, MD;  Location: [facility];  Service: ENT;  Laterality: Right;    Family History  Problem Relation Age of Onset   Hypertension Mother    Other Father        unsure of medical history   Breast cancer Maternal Aunt    Allergic rhinitis Neg Hx    Angioedema Neg Hx    Asthma Neg Hx    Atopy Neg Hx    Eczema Neg Hx    Immunodeficiency Neg Hx    Urticaria Neg Hx     Social History   Tobacco Use   Smoking status: Never    Passive exposure: Never   Smokeless tobacco: Never  Vaping Use   Vaping status: Never Used  Substance Use Topics   Alcohol use: Not Currently   Drug use: Never    ROS   Objective:   Vitals: BP (P) 131/77 (BP Location: Right Arm)   Pulse (P) 84   Temp (P) 98.3 F (36.8 C) (Oral)   Resp (P) 20   SpO2 (P) 96%   Physical Exam Constitutional:      General: She is not in acute distress.    Appearance: Normal appearance. She is well-developed. She is not ill-appearing, toxic-appearing or diaphoretic.  HENT:     Head: Normocephalic and atraumatic.     Nose: Nose normal.     Mouth/Throat:     Mouth:  Mucous membranes are moist.  Eyes:     General: No scleral icterus.       Right eye: No discharge.        Left eye: No discharge.     Extraocular Movements: Extraocular movements intact.  Cardiovascular:     Rate and Rhythm: Normal rate.  Pulmonary:     Effort: Pulmonary effort is  normal.  Musculoskeletal:     Right ankle: Swelling present. No deformity, ecchymosis or lacerations. Tenderness present over the lateral malleolus and medial malleolus. No ATF ligament, AITF ligament, CF ligament, posterior TF ligament, base of 5th metatarsal or proximal fibula tenderness. Decreased range of motion.     Right Achilles Tendon: No tenderness or defects. Thompson's test negative.  Skin:    General: Skin is warm and dry.  Neurological:     General: No focal deficit present.     Mental Status: She is alert and oriented to person, place, and time.  Psychiatric:        Mood and Affect: Mood normal.        Behavior: Behavior normal.     DG Ankle Complete Right Result Date: 06/27/2024 EXAM: 3 OR MORE VIEW(S) XRAY OF THE RIGHT ANKLE 06/27/2024 01:53:40 PM CLINICAL HISTORY: right ankle pain, swelling COMPARISON: None available. FINDINGS: BONES AND JOINTS: Well corticated osseous fragment at medial malleolus consistent with old trauma. Large plantar calcaneal spur and Achilles enthesophyte. Degenerative changes of midfoot and hindfoot. No joint dislocation. SOFT TISSUES: Soft tissue swelling of the ankle. IMPRESSION: 1. Soft tissue swelling of the ankle. 2. No acute osseous abnormality. Electronically signed by: Waddell Calk MD 06/27/2024 02:32 PM EDT RP Workstation: [id number]   Right ankle wrapped using 4 Ace wrap in figure-8 method.   Assessment and Plan :   PDMP not reviewed this encounter.  1. Pain and swelling of right ankle    Patient has significant degenerative changes, signs of an old trauma.  Advised that this is likely the source of her symptoms.  Recommended 1 more round of  prednisone .  Emphasized need for follow-up with an orthopedist.  Otherwise can use RICE method in the meantime.  Counseled patient on potential for adverse effects with medications prescribed/recommended today, ER and return-to-clinic precautions discussed, patient verbalized understanding.    Christopher Savannah, [place] 06/27/24 [id number]

## 2024-08-26 ENCOUNTER — Other Ambulatory Visit: Payer: Self-pay

## 2024-08-26 ENCOUNTER — Ambulatory Visit
Admission: RE | Admit: 2024-08-26 | Discharge: 2024-08-26 | Disposition: A | Discharge status: Home / Self Care | Attending: Emergency Medicine | Admitting: Emergency Medicine

## 2024-08-26 VITALS — BP 134/79 | HR 97 | Temp 98.4°F | Resp 19 | Ht 66.0 in | Wt 239.9 lb

## 2024-08-26 DIAGNOSIS — J45901 Unspecified asthma with (acute) exacerbation: Secondary | ICD-10-CM

## 2024-08-26 MED ORDER — PREDNISONE 20 MG PO TABS: 40.0000 mg | ORAL_TABLET | Freq: Every day | ORAL | 0 refills | Status: AC

## 2024-08-26 NOTE — ED Triage Notes (Signed)
 Pt presents to urgent care with complaints of SOB, wheezing, and feeling very winded with very little exertion. Believes it is her asthma flaring up. Day 3 of symptoms. No pain at this time. Two nebulizer's overnight. One was at 0300 and the other was at 0700 this morning. No improvement/relief with treatments. States this has happened in the past and the prednisone  prescribed helped her symptoms.

## 2024-08-26 NOTE — Discharge Instructions (Signed)
 Take the prednisone  today and then in the morning with breakfast.  Continue to use albuterol  as needed for wheezing and shortness of breath.  Ensure you are drinking at least 64 ounces of water daily and sleep with a humidifier at night to help loosen any secretions.  Symptoms should improve with the prednisone .  If no improvement or any changes please seek follow-up care.

## 2024-08-26 NOTE — ED Provider Notes (Signed)
 [facility]    CSN: [id number] Arrival date & time: 08/26/24  1439      History   Chief Complaint Chief Complaint  Patient presents with   Asthma     HPI Joyce Welch is a 53 y.o. female.   Patient presents to clinic over concern of shortness of breath and wheezing for the past 3 days.  Last week she did have a lot of nasal congestion, nosebleeds and rhinorrhea.  She is unsure if they are related.  Does have a history of asthma and has been using her albuterol  inhaler and albuterol  breathing treatment at home.  She used a breathing treatment at 3 AM and 7 AM this morning.  Has not had any fevers.  Is worried she is having an asthma exacerbation.  The history is provided by the patient and medical records.    Past Medical History:  Diagnosis Date   Asthma    Chronic cough    COVID-19 09/18/2021   Fibroids    GERD (gastroesophageal reflux disease)    Hypertension    OAB (overactive bladder)    UTI (urinary tract infection)     Patient Active Problem List   Diagnosis Date Noted   Pulmonary nodule 10/04/2022   Lumbar spondylosis 09/22/2022   Neuropathic pain 09/22/2022   Lumbar radiculopathy 07/11/2022   Pain of lumbar spine 06/12/2022   Allergic conjunctivitis of both eyes 02/22/2022   Perennial allergic rhinitis 02/22/2022   Gastroesophageal reflux disease 02/22/2022   Mild persistent asthma without complication 02/22/2022   Pain in joint of right hip 02/06/2022   Chest pain of uncertain etiology 12/06/2021   Heart murmur 12/06/2021   Effusion of right knee 10/24/2021   Pain of right hip joint 10/24/2021   Arthralgia of right knee 09/20/2021   Post-nasal drainage 08/30/2021   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   Carpal tunnel syndrome of right wrist 07/19/2021   Paresthesia 10/17/2018   Weakness 10/17/2018   Acute sinusitis 09/24/2018   Chronic maxillary sinusitis 08/08/2018   Chronic cough 08/08/2018   Chronic throat  clearing 06/20/2018   Post-nasal drip 06/20/2018   Globus sensation 06/20/2018   Asthma exacerbation 10/31/2017   Leukocytosis 10/31/2017   Strain of quadriceps tendon 02/14/2014   Symptomatic menopausal or female climacteric states 07/31/2013    Past Surgical History:  Procedure Laterality Date   ABDOMINAL HYSTERECTOMY  02/05/2006   partial   CESAREAN SECTION  1994, 2002, 2005,    x 3   MAXILLARY ANTROSTOMY Right 10/07/2018   Procedure: RIGHT MAXILLARY ANTROSTOMY WITH TISSUE REMOVAL.;  Surgeon: Carlie Clark, MD;  Location: [facility];  Service: ENT;  Laterality: Right;    OB History     Gravida  3   Para  3   Term  1   Preterm      AB      Living  3      SAB      IAB      Ectopic      Multiple      Live Births  3            Home Medications    Prior to Admission medications   Medication Sig Start Date End Date Taking? Authorizing Provider  predniSONE  (DELTASONE ) 20 MG tablet Take 2 tablets (40 mg total) by mouth daily with breakfast for 5 days. 08/26/24 08/31/24 Yes Marbeth Smedley  N, FNP  AIRSUPRA 90-80 MCG/ACT AERO SMARTSIG:2 Puff(s) By  Mouth Every 4 Hours PRN 10/05/23   [provider]  albuterol  (PROVENTIL ) (2.5 MG/3ML) 0.083% nebulizer solution Take 3 mLs (2.5 mg total) by nebulization every 6 (six) hours as needed for wheezing or shortness of breath. 03/31/22   Hazen Darryle BRAVO, FNP  albuterol  (VENTOLIN  HFA) 108 (90 Base) MCG/ACT inhaler Inhale 1 puff into the lungs every 6 (six) hours as needed for wheezing or shortness of breath. 10/28/20   Christopher Savannah, PA-C  ALPRAZolam  (XANAX ) 0.25 MG tablet Take 0.25 mg by mouth as directed. 10/29/17   [provider]  amLODipine (NORVASC) 5 MG tablet Take 5 mg by mouth daily.    [provider]  azelastine  (ASTELIN ) 0.1 % nasal spray Place 1 spray into both nostrils 2 (two) times daily. Use in each nostril as directed Patient taking differently: Place 1 spray into both  nostrils See admin instructions. Instill 1 spray into each nostril two times a day as directed 06/04/18   Sood, Vineet, MD  benzonatate  (TESSALON ) 100 MG capsule Take 1 capsule (100 mg total) by mouth every 8 (eight) hours. Patient not taking: Reported on 01/07/2024 09/23/23   Smoot, Lauraine LABOR, PA-C  cetirizine  (ZYRTEC  ALLERGY ) 10 MG tablet Take 1 tablet (10 mg total) by mouth daily. 08/11/23   Christopher Savannah, PA-C  dicyclomine (BENTYL) 20 MG tablet Take 20 mg by mouth 3 (three) times daily as needed. 02/22/24   [provider]  DULERA  200-5 MCG/ACT AERO SMARTSIG:1 Puff(s) By Mouth Every 12 Hours 07/12/23   [provider]  DULoxetine (CYMBALTA) 30 MG capsule Take 1 tablet by mouth daily.    [provider]  fluticasone  (FLONASE ) 50 MCG/ACT nasal spray Place 2 sprays into both nostrils daily. 06/09/23   [provider]  gabapentin (NEURONTIN) 100 MG capsule Take 100 mg by mouth daily at 6 (six) AM.    [provider]  ibuprofen  (ADVIL ) 600 MG tablet Take 1 tablet (600 mg total) by mouth every 6 (six) hours as needed. 12/03/22   Lynwood Lenis, PA-C  ipratropium (ATROVENT ) 0.03 % nasal spray Place 2 sprays into both nostrils 3 (three) times daily. 12/28/21   Marinda Rocky SAILOR, MD  loratadine  (CLARITIN ) 10 MG tablet Take 10 mg by mouth daily.    [provider]  meclizine  (ANTIVERT ) 12.5 MG tablet Take 1 tablet (12.5 mg total) by mouth 3 (three) times daily as needed for dizziness. 08/11/23   Christopher Savannah, PA-C  methocarbamol  (ROBAXIN ) 500 MG tablet Take 1 tablet (500 mg total) by mouth 2 (two) times daily as needed for muscle spasms. Patient not taking: Reported on 01/07/2024 06/22/23   Hazen Darryle BRAVO, FNP  mometasone -formoterol  (DULERA ) 100-5 MCG/ACT AERO Inhale 2 puffs into the lungs 2 (two) times daily. 05/31/22   Marinda Rocky SAILOR, MD  montelukast  (SINGULAIR ) 10 MG tablet Take 10 mg by mouth at bedtime. 02/19/24   [provider]  oxybutynin  (DITROPAN ) 5 MG tablet  Take 5 mg by mouth 2 (two) times daily. 11/07/21   [provider]  oxybutynin  (DITROPAN -XL) 10 MG 24 hr tablet Take 10 mg by mouth daily. 10/15/23   [provider]  pantoprazole  (PROTONIX ) 40 MG tablet Take 1 tablet (40 mg total) by mouth 2 (two) times daily. 02/22/22   Marinda Rocky SAILOR, MD  polyethylene glycol powder (GLYCOLAX /MIRALAX ) 17 GM/SCOOP powder Take 17 g by mouth daily. 03/07/24   Nivia Colon, PA-C  sucralfate  (CARAFATE ) 1 g tablet Take 1 tablet (1 g total) by mouth 4 (  four) times daily -  with meals and at bedtime. 03/07/24   Nivia Colon, PA-C  tiZANidine (ZANAFLEX) 4 MG tablet Take 8 mg by mouth at bedtime as needed. Patient not taking: Reported on 01/07/2024 08/13/23   [provider]  Vitamin D, Ergocalciferol, (DRISDOL) 1.25 MG (50000 UT) CAPS capsule Take 50,000 Units by mouth once a week. 05/16/19   [provider]    Family History Family History  Problem Relation Age of Onset   Hypertension Mother    Other Father        unsure of medical history   Breast cancer Maternal Aunt    Allergic rhinitis Neg Hx    Angioedema Neg Hx    Asthma Neg Hx    Atopy Neg Hx    Eczema Neg Hx    Immunodeficiency Neg Hx    Urticaria Neg Hx     Social History Social History   Tobacco Use   Smoking status: Never    Passive exposure: Never   Smokeless tobacco: Never  Vaping Use   Vaping status: Never Used  Substance Use Topics   Alcohol use: Not Currently   Drug use: Never     Allergies   Patient has no known allergies.   Review of Systems Review of Systems  Per HPI  Physical Exam Triage Vital Signs ED Triage Vitals  Encounter Vitals Group     BP 08/26/24 1454 134/79     Girls Systolic BP Percentile --      Girls Diastolic BP Percentile --      Boys Systolic BP Percentile --      Boys Diastolic BP Percentile --      Pulse Rate 08/26/24 1454 97     Resp 08/26/24 1454 19     Temp 08/26/24 1454 98.4 F (36.9 C)     Temp Source 08/26/24  1454 Oral     SpO2 08/26/24 1454 99 %     Weight 08/26/24 1454 239 lb 13.8 oz (108.8 kg)     Height 08/26/24 1454 5' 6 (1.676 m)     Head Circumference --      Peak Flow --      Pain Score 08/26/24 1455 0     Pain Loc --      Pain Education --      Exclude from Growth Chart --    No data found.  Updated Vital Signs BP 134/79 (BP Location: Right Arm)   Pulse 97   Temp 98.4 F (36.9 C) (Oral)   Resp 19   Ht 5' 6 (1.676 m)   Wt 239 lb 13.8 oz (108.8 kg)   SpO2 99%   BMI 38.71 kg/m   Visual Acuity Right Eye Distance:   Left Eye Distance:   Bilateral Distance:    Right Eye Near:   Left Eye Near:    Bilateral Near:     Physical Exam Vitals and nursing note reviewed.  Constitutional:      Appearance: Normal appearance.  HENT:     Head: Normocephalic and atraumatic.     Right Ear: External ear normal.     Left Ear: External ear normal.     Nose: Nose normal.     Mouth/Throat:     Mouth: Mucous membranes are moist.  Eyes:     Conjunctiva/sclera: Conjunctivae normal.  Cardiovascular:     Rate and Rhythm: Normal rate and regular rhythm.     Heart sounds: Normal heart sounds.  Pulmonary:  Effort: Pulmonary effort is normal. No respiratory distress.     Breath sounds: Normal breath sounds.  Skin:    General: Skin is warm and dry.  Neurological:     General: No focal deficit present.     Mental Status: She is alert and oriented to person, place, and time.  Psychiatric:        Mood and Affect: Mood normal.        Behavior: Behavior normal.      UC Treatments / Results  Labs (all labs ordered are listed, but only abnormal results are displayed) Labs Reviewed - No data to display  EKG   Radiology No results found.  Procedures Procedures (including critical care time)  Medications Ordered in UC Medications - No data to display  Initial Impression / Assessment and Plan / UC Course  I have reviewed the triage vital signs and the nursing  notes.  Pertinent labs & imaging results that were available during my care of the patient were reviewed by me and considered in my medical decision making (see chart for details).  Vitals and triage reviewed, patient is hemodynamically stable.  Lungs vesicular, heart with regular rate and rhythm.  Symptoms consistent with mild asthma exacerbation, will send in steroid burst.  Oxygenation stable and able to speak in full sentences.    Plan of care, follow-up care return precautions given, no questions at this time.  Work note provided.     Final Clinical Impressions(s) / UC Diagnoses   Final diagnoses:  Mild asthma with acute exacerbation, unspecified whether persistent     Discharge Instructions      Take the prednisone  today and then in the morning with breakfast.  Continue to use albuterol  as needed for wheezing and shortness of breath.  Ensure you are drinking at least 64 ounces of water daily and sleep with a humidifier at night to help loosen any secretions.  Symptoms should improve with the prednisone .  If no improvement or any changes please seek follow-up care.    ED Prescriptions     Medication Sig Dispense Auth. Provider   predniSONE  (DELTASONE ) 20 MG tablet Take 2 tablets (40 mg total) by mouth daily with breakfast for 5 days. 10 tablet Dreama Merrill SAILOR, FNP      PDMP not reviewed this encounter.   Dreama, Rigby Swamy  N, FNP 08/26/24 1517

## 2024-09-04 ENCOUNTER — Ambulatory Visit (INDEPENDENT_AMBULATORY_CARE_PROVIDER_SITE_OTHER): Admitting: Radiology

## 2024-09-04 ENCOUNTER — Ambulatory Visit
Admission: EM | Admit: 2024-09-04 | Discharge: 2024-09-04 | Disposition: A | Discharge status: Home / Self Care | Attending: Emergency Medicine | Admitting: Emergency Medicine

## 2024-09-04 ENCOUNTER — Other Ambulatory Visit: Payer: Self-pay

## 2024-09-04 DIAGNOSIS — R0789 Other chest pain: Secondary | ICD-10-CM | POA: Diagnosis not present

## 2024-09-04 DIAGNOSIS — R0981 Nasal congestion: Secondary | ICD-10-CM

## 2024-09-04 MED ORDER — BENZONATATE 100 MG PO CAPS: 100.0000 mg | ORAL_CAPSULE | Freq: Three times a day (TID) | ORAL | 0 refills | Status: DC

## 2024-09-04 MED ORDER — PSEUDOEPHEDRINE HCL 30 MG PO TABS: 30.0000 mg | ORAL_TABLET | ORAL | 0 refills | Status: AC | PRN

## 2024-09-04 NOTE — ED Triage Notes (Addendum)
 Pt presents to urgent care for follow-up visit. States she is feeling pain in the left side of her chest with deep breaths and with coughing. Was able to finish the prescribed prednisone . Feels like this improved her symptoms. However, four days ago, symptoms returned. Has been using nebulizer and inhaler as well. Only used the inhaler two times today. Does endorse SOB + nasal congestion at this time. 98% O2 stat on room air in triage.

## 2024-09-04 NOTE — ED Provider Notes (Signed)
 [facility]    CSN: [id number] Arrival date & time: 09/04/24  1457      History   Chief Complaint Chief Complaint  Patient presents with   Chest Pain   Nasal Congestion    HPI Joyce Welch is a 53 y.o. female.     Patient was seen on 11/25 in clinic and started on steroids for asthma exacerbation. Took the steroids and was feeling better. Finished these on Sunday. Monday / Tuesday patient started to have nasal congestion, cough and left sided chest pain with coughing. Cough is productive with green mucus. Has not had wheezing or shortness of breath.   Last had a breathing treatment yesterday, has not used nebulizer today, has not felt like she needed it.   Denies fevers. Works in a school so assumes multiple sick contacts.   The history is provided by the patient and medical records.  Chest Pain   Past Medical History:  Diagnosis Date   Asthma    Chronic cough    COVID-19 09/18/2021   Fibroids    GERD (gastroesophageal reflux disease)    Hypertension    OAB (overactive bladder)    UTI (urinary tract infection)     Patient Active Problem List   Diagnosis Date Noted   Pulmonary nodule 10/04/2022   Lumbar spondylosis 09/22/2022   Neuropathic pain 09/22/2022   Lumbar radiculopathy 07/11/2022   Pain of lumbar spine 06/12/2022   Allergic conjunctivitis of both eyes 02/22/2022   Perennial allergic rhinitis 02/22/2022   Gastroesophageal reflux disease 02/22/2022   Mild persistent asthma without complication 02/22/2022   Pain in joint of right hip 02/06/2022   Chest pain of uncertain etiology 12/06/2021   Heart murmur 12/06/2021   Effusion of right knee 10/24/2021   Pain of right hip joint 10/24/2021   Arthralgia of right knee 09/20/2021   Post-nasal drainage 08/30/2021   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   Carpal tunnel syndrome of right wrist 07/19/2021   Paresthesia 10/17/2018   Weakness 10/17/2018   Acute sinusitis  09/24/2018   Chronic maxillary sinusitis 08/08/2018   Chronic cough 08/08/2018   Chronic throat clearing 06/20/2018   Post-nasal drip 06/20/2018   Globus sensation 06/20/2018   Asthma exacerbation 10/31/2017   Leukocytosis 10/31/2017   Strain of quadriceps tendon 02/14/2014   Symptomatic menopausal or female climacteric states 07/31/2013    Past Surgical History:  Procedure Laterality Date   ABDOMINAL HYSTERECTOMY  02/05/2006   partial   CESAREAN SECTION  1994, 2002, 2005,    x 3   MAXILLARY ANTROSTOMY Right 10/07/2018   Procedure: RIGHT MAXILLARY ANTROSTOMY WITH TISSUE REMOVAL.;  Surgeon: Carlie Clark, MD;  Location: [facility];  Service: ENT;  Laterality: Right;    OB History     Gravida  3   Para  3   Term  1   Preterm      AB      Living  3      SAB      IAB      Ectopic      Multiple      Live Births  3            Home Medications    Prior to Admission medications   Medication Sig Start Date End Date Taking? Authorizing Provider  AIRSUPRA 90-80 MCG/ACT AERO SMARTSIG:2 Puff(s) By Mouth Every 4 Hours PRN 10/05/23   [provider]  albuterol  (PROVENTIL ) (2.5 MG/3ML) 0.083%  nebulizer solution Take 3 mLs (2.5 mg total) by nebulization every 6 (six) hours as needed for wheezing or shortness of breath. 03/31/22   Hazen Darryle BRAVO, FNP  albuterol  (VENTOLIN  HFA) 108 (90 Base) MCG/ACT inhaler Inhale 1 puff into the lungs every 6 (six) hours as needed for wheezing or shortness of breath. 10/28/20   Christopher Savannah, PA-C  ALPRAZolam  (XANAX ) 0.25 MG tablet Take 0.25 mg by mouth as directed. 10/29/17   [provider]  amLODipine (NORVASC) 5 MG tablet Take 5 mg by mouth daily.    [provider]  azelastine  (ASTELIN ) 0.1 % nasal spray Place 1 spray into both nostrils 2 (two) times daily. Use in each nostril as directed Patient taking differently: Place 1 spray into both nostrils See admin instructions. Instill 1 spray into each  nostril two times a day as directed 06/04/18   Sood, Vineet, MD  benzonatate  (TESSALON ) 100 MG capsule Take 1 capsule (100 mg total) by mouth every 8 (eight) hours. 09/04/24  Yes Gaila Engebretsen  N, FNP  cetirizine  (ZYRTEC  ALLERGY ) 10 MG tablet Take 1 tablet (10 mg total) by mouth daily. 08/11/23   Christopher Savannah, PA-C  dicyclomine (BENTYL) 20 MG tablet Take 20 mg by mouth 3 (three) times daily as needed. 02/22/24   [provider]  DULERA  200-5 MCG/ACT AERO SMARTSIG:1 Puff(s) By Mouth Every 12 Hours 07/12/23   [provider]  DULoxetine (CYMBALTA) 30 MG capsule Take 1 tablet by mouth daily.    [provider]  fluticasone  (FLONASE ) 50 MCG/ACT nasal spray Place 2 sprays into both nostrils daily. 06/09/23   [provider]  gabapentin (NEURONTIN) 100 MG capsule Take 100 mg by mouth daily at 6 (six) AM.    [provider]  ibuprofen  (ADVIL ) 600 MG tablet Take 1 tablet (600 mg total) by mouth every 6 (six) hours as needed. 12/03/22   Lynwood Lenis, PA-C  ipratropium (ATROVENT ) 0.03 % nasal spray Place 2 sprays into both nostrils 3 (three) times daily. 12/28/21   Marinda Rocky SAILOR, MD  loratadine  (CLARITIN ) 10 MG tablet Take 10 mg by mouth daily.    [provider]  meclizine  (ANTIVERT ) 12.5 MG tablet Take 1 tablet (12.5 mg total) by mouth 3 (three) times daily as needed for dizziness. 08/11/23   Christopher Savannah, PA-C  methocarbamol  (ROBAXIN ) 500 MG tablet Take 1 tablet (500 mg total) by mouth 2 (two) times daily as needed for muscle spasms. Patient not taking: Reported on 01/07/2024 06/22/23   Hazen Darryle BRAVO, FNP  mometasone -formoterol  (DULERA ) 100-5 MCG/ACT AERO Inhale 2 puffs into the lungs 2 (two) times daily. 05/31/22   Marinda Rocky SAILOR, MD  montelukast  (SINGULAIR ) 10 MG tablet Take 10 mg by mouth at bedtime. 02/19/24   [provider]  oxybutynin  (DITROPAN ) 5 MG tablet Take 5 mg by mouth 2 (two) times daily. 11/07/21   [provider]  oxybutynin   (DITROPAN -XL) 10 MG 24 hr tablet Take 10 mg by mouth daily. 10/15/23   [provider]  pantoprazole  (PROTONIX ) 40 MG tablet Take 1 tablet (40 mg total) by mouth 2 (two) times daily. 02/22/22   Marinda Rocky SAILOR, MD  polyethylene glycol powder (GLYCOLAX /MIRALAX ) 17 GM/SCOOP powder Take 17 g by mouth daily. 03/07/24   Nivia Colon, PA-C  pseudoephedrine (SUDAFED) 30 MG tablet Take 1 tablet (30 mg total) by mouth every 4 (four) hours as needed for congestion. 09/04/24  Yes Weber Monnier  N, FNP  sucralfate  (CARAFATE ) 1 g tablet Take 1 tablet (  1 g total) by mouth 4 (four) times daily -  with meals and at bedtime. 03/07/24   Nivia Colon, PA-C  tiZANidine (ZANAFLEX) 4 MG tablet Take 8 mg by mouth at bedtime as needed. Patient not taking: Reported on 01/07/2024 08/13/23   [provider]  Vitamin D, Ergocalciferol, (DRISDOL) 1.25 MG (50000 UT) CAPS capsule Take 50,000 Units by mouth once a week. 05/16/19   [provider]    Family History Family History  Problem Relation Age of Onset   Hypertension Mother    Other Father        unsure of medical history   Breast cancer Maternal Aunt    Allergic rhinitis Neg Hx    Angioedema Neg Hx    Asthma Neg Hx    Atopy Neg Hx    Eczema Neg Hx    Immunodeficiency Neg Hx    Urticaria Neg Hx     Social History Social History   Tobacco Use   Smoking status: Never    Passive exposure: Never   Smokeless tobacco: Never  Vaping Use   Vaping status: Never Used  Substance Use Topics   Alcohol use: Not Currently   Drug use: Never     Allergies   Patient has no known allergies.   Review of Systems Review of Systems  Per HPI  Physical Exam Triage Vital Signs ED Triage Vitals  Encounter Vitals Group     BP 09/04/24 1511 111/68     Girls Systolic BP Percentile --      Girls Diastolic BP Percentile --      Boys Systolic BP Percentile --      Boys Diastolic BP Percentile --      Pulse Rate 09/04/24 1511 (!) 101     Resp  09/04/24 1511 20     Temp 09/04/24 1511 98.1 F (36.7 C)     Temp Source 09/04/24 1511 Oral     SpO2 09/04/24 1511 98 %     Weight 09/04/24 1519 239 lb 13.8 oz (108.8 kg)     Height 09/04/24 1519 5' 6 (1.676 m)     Head Circumference --      Peak Flow --      Pain Score 09/04/24 1516 7     Pain Loc --      Pain Education --      Exclude from Growth Chart --    No data found.  Updated Vital Signs BP 111/68 (BP Location: Right Arm)   Pulse (!) 101   Temp 98.1 F (36.7 C) (Oral)   Resp 20   Ht 5' 6 (1.676 m)   Wt 239 lb 13.8 oz (108.8 kg)   SpO2 98%   BMI 38.71 kg/m   Visual Acuity Right Eye Distance:   Left Eye Distance:   Bilateral Distance:    Right Eye Near:   Left Eye Near:    Bilateral Near:     Physical Exam Vitals and nursing note reviewed.  Constitutional:      Appearance: Normal appearance. She is well-developed.  HENT:     Head: Normocephalic and atraumatic.     Right Ear: External ear normal.     Left Ear: External ear normal.     Nose: Nose normal.     Mouth/Throat:     Mouth: Mucous membranes are moist.  Eyes:     Conjunctiva/sclera: Conjunctivae normal.  Cardiovascular:     Rate and Rhythm: Normal rate and regular rhythm.  Heart sounds: Normal heart sounds.  Pulmonary:     Effort: Pulmonary effort is normal.     Breath sounds: Normal breath sounds.  Chest:     Chest wall: Tenderness present.  Skin:    General: Skin is warm and dry.  Neurological:     General: No focal deficit present.     Mental Status: She is alert and oriented to person, place, and time.  Psychiatric:        Mood and Affect: Mood normal.        Behavior: Behavior normal. Behavior is cooperative.      UC Treatments / Results  Labs (all labs ordered are listed, but only abnormal results are displayed) Labs Reviewed - No data to display  EKG   Radiology No results found.  Procedures Procedures (including critical care time)  Medications Ordered in  UC Medications - No data to display  Initial Impression / Assessment and Plan / UC Course  I have reviewed the triage vital signs and the nursing notes.  Pertinent labs & imaging results that were available during my care of the patient were reviewed by me and considered in my medical decision making (see chart for details).  Notes and triage reviewed, patient is hemodynamically stable.  Lungs are vesicular, heart with regular rate and rhythm.  Left-sided chest wall pain is evoked when coughing, reproducible to palpation.  Reassuring, less likely cardiac etiology.  Chest x-ray by my interpretation does not show infiltrate or obvious abnormality.  With nasal congestion and rhinorrhea suspect recent viral URI.  Symptom management discussed.  Plan of care, follow-up care return precautions given, no questions at this time.  Work note provided.    Final Clinical Impressions(s) / UC Diagnoses   Final diagnoses:  Chest wall pain  Nasal congestion     Discharge Instructions      I did not see pneumonia on your chest x-ray.  I believe your chest wall pain is due to coughing.  You can heat or ice the area.  Alternate between 600 mg of ibuprofen  and 500 mg of Tylenol  every 4-6 hours to help with pain.  Symptoms are consistent with a viral illness.  To help break up congestion you can use Sudafed.  Consider sleeping with a humidifier to moisten the air at night and continue to blow out your secretions.  I suggest using an over-the-counter saline nasal sinus rinse, use filtered water for this.  Viral illnesses typically last 5 to 7 days.  If you develop any new concerning symptoms or prolonged symptoms follow-up with your primary care provider or return to clinic for reevaluation.      ED Prescriptions     Medication Sig Dispense Auth. Provider   pseudoephedrine (SUDAFED) 30 MG tablet Take 1 tablet (30 mg total) by mouth every 4 (four) hours as needed for congestion. 30 tablet  Dreama, Yarah Fuente  N, FNP   benzonatate  (TESSALON ) 100 MG capsule Take 1 capsule (100 mg total) by mouth every 8 (eight) hours. 21 capsule Dreama, Aldan Camey  N, FNP      PDMP not reviewed this encounter.   Dreama Jenet SAILOR, [place] 09/04/24 [phone]

## 2024-09-04 NOTE — Discharge Instructions (Signed)
 I did not see pneumonia on your chest x-ray.  I believe your chest wall pain is due to coughing.  You can heat or ice the area.  Alternate between 600 mg of ibuprofen  and 500 mg of Tylenol  every 4-6 hours to help with pain.  Symptoms are consistent with a viral illness.  To help break up congestion you can use Sudafed.  Consider sleeping with a humidifier to moisten the air at night and continue to blow out your secretions.  I suggest using an over-the-counter saline nasal sinus rinse, use filtered water for this.  Viral illnesses typically last 5 to 7 days.  If you develop any new concerning symptoms or prolonged symptoms follow-up with your primary care provider or return to clinic for reevaluation.

## 2024-10-02 ENCOUNTER — Ambulatory Visit

## 2024-10-03 ENCOUNTER — Emergency Department (HOSPITAL_COMMUNITY)

## 2024-10-03 ENCOUNTER — Emergency Department (HOSPITAL_COMMUNITY)
Admission: EM | Admit: 2024-10-03 | Discharge: 2024-10-04 | Disposition: A | Discharge status: Home / Self Care | Attending: Emergency Medicine | Admitting: Emergency Medicine

## 2024-10-03 ENCOUNTER — Encounter (HOSPITAL_COMMUNITY): Payer: Self-pay

## 2024-10-03 DIAGNOSIS — R079 Chest pain, unspecified: Secondary | ICD-10-CM | POA: Insufficient documentation

## 2024-10-03 DIAGNOSIS — K029 Dental caries, unspecified: Secondary | ICD-10-CM | POA: Diagnosis not present

## 2024-10-03 DIAGNOSIS — K0889 Other specified disorders of teeth and supporting structures: Secondary | ICD-10-CM

## 2024-10-03 LAB — BASIC METABOLIC PANEL WITH GFR
Anion gap: 10 (ref 5–15)
BUN: 14 mg/dL (ref 6–20)
CO2: 26 mmol/L (ref 22–32)
Calcium: 9.2 mg/dL (ref 8.9–10.3)
Chloride: 105 mmol/L (ref 98–111)
Creatinine, Ser: 0.89 mg/dL (ref 0.44–1.00)
GFR, Estimated: >60 mL/min
Glucose, Bld: 86 mg/dL (ref 70–99)
Potassium: 4.4 mmol/L (ref 3.5–5.1)
Sodium: 140 mmol/L (ref 135–145)

## 2024-10-03 LAB — CBC
HCT: 39.8 % (ref 36.0–46.0)
Hemoglobin: 12.4 g/dL (ref 12.0–15.0)
MCH: 25.2 pg — ABNORMAL LOW (ref 26.0–34.0)
MCHC: 31.2 g/dL (ref 30.0–36.0)
MCV: 80.7 fL (ref 80.0–100.0)
Platelets: 253 K/uL (ref 150–400)
RBC: 4.93 MIL/uL (ref 3.87–5.11)
RDW: 13 % (ref 11.5–15.5)
WBC: 8.8 K/uL (ref 4.0–10.5)
nRBC: 0 % (ref 0.0–0.2)

## 2024-10-03 LAB — TROPONIN T, HIGH SENSITIVITY
Troponin T High Sensitivity: <15 ng/L (ref 0–19)
Troponin T High Sensitivity: <15 ng/L (ref 0–19)

## 2024-10-03 NOTE — ED Provider Triage Note (Signed)
 Emergency Medicine Provider Triage Evaluation Note  Joyce Welch , a 54 y.o. female  was evaluated in triage.  Pt complains of CP onset 09/30/24, left shoulder and neck, leg pain, described as running. Had top right tooth pulled on Tuesday, no problems with the extraction, had sutures placed, swelling to left side of face. Not currently on abx. Taking tylenol .  Hx of HTN. No hx of HLD or DM. Review of Systems  Positive: CP, dental pain, SHOB (attributes to her asthma) Negative: Vomiting  Physical Exam  BP 136/72 (BP Location: Left Arm)   Pulse 77   Temp 98.9 F (37.2 C)   Resp 18   SpO2 100%  Gen:   Awake, no distress   Resp:  Normal effort  MSK:   Moves extremities without difficulty  Other:    Medical Decision Making  Medically screening exam initiated at 6:25 PM.  Appropriate orders placed.  Joyce Welch was informed that the remainder of the evaluation will be completed by another provider, this initial triage assessment does not replace that evaluation, and the importance of remaining in the ED until their evaluation is complete.     Beverley Leita LABOR, PA-C 10/03/24 1827

## 2024-10-03 NOTE — ED Triage Notes (Signed)
 Pt c/o L upper dental pain and swelling x3 days and intermittent L chest discomfort x3 weeks.  Pt reports she had a tooth removed x3 days ago.  Pain score 8/10.

## 2024-10-04 ENCOUNTER — Other Ambulatory Visit: Payer: Self-pay

## 2024-10-04 MED ORDER — AMOXICILLIN 500 MG PO CAPS: 500.0000 mg | ORAL_CAPSULE | Freq: Three times a day (TID) | ORAL | 0 refills | Status: AC

## 2024-10-04 MED ORDER — AMOXICILLIN 500 MG PO CAPS
500.0000 mg | ORAL_CAPSULE | Freq: Once | ORAL | Status: AC
  Administered 2024-10-04: 500 mg via ORAL
  Filled 2024-10-04: qty 1

## 2024-10-04 MED ORDER — KETOROLAC TROMETHAMINE 15 MG/ML IJ SOLN
15.0000 mg | Freq: Once | INTRAMUSCULAR | Status: AC
  Administered 2024-10-04: 15 mg via INTRAMUSCULAR
  Filled 2024-10-04: qty 1

## 2024-10-04 NOTE — ED Provider Notes (Signed)
 " [place] [facility] Provider Note   CSN: [id number] Arrival date & time: 10/03/24  1701     Patient presents with: Dental Pain and Chest Pain   Joyce Welch is a 54 y.o. female.    Dental Pain Chest Pain  54 year old female presenting with chest pain and dental pain.  Patient had a recent tooth extraction about 3 days ago.  Over the past few days she has noticed an increase in pain and swelling in her jaw and cheek.  She was not prescribed any antibiotics or pain medication from her dentist.  She denies any difficulty swallowing, difficulty breathing, difficulty speaking.  Patient also has been having chest pain for the past 3 weeks.  She has been trying to get into see her primary care however has not been able to get an appointment.  She reports that this is a constant pain that does not go away.  She feels it into her arm, neck, and leg.  Nothing makes it better or worse.  She does not complain of any exertional chest pain.  She denies any shortness of breath.     Prior to Admission medications  Medication Sig Start Date End Date Taking? Authorizing Provider  AIRSUPRA 90-80 MCG/ACT AERO SMARTSIG:2 Puff(s) By Mouth Every 4 Hours PRN 10/05/23   [provider]  albuterol  (PROVENTIL ) (2.5 MG/3ML) 0.083% nebulizer solution Take 3 mLs (2.5 mg total) by nebulization every 6 (six) hours as needed for wheezing or shortness of breath. 03/31/22   Hazen Darryle BRAVO, FNP  albuterol  (VENTOLIN  HFA) 108 (90 Base) MCG/ACT inhaler Inhale 1 puff into the lungs every 6 (six) hours as needed for wheezing or shortness of breath. 10/28/20   Christopher Savannah, PA-C  ALPRAZolam  (XANAX ) 0.25 MG tablet Take 0.25 mg by mouth as directed. 10/29/17   [provider]  amLODipine (NORVASC) 5 MG tablet Take 5 mg by mouth daily.    [provider]  azelastine  (ASTELIN ) 0.1 % nasal spray Place 1 spray into both nostrils 2 (two) times daily. Use in each nostril as  directed Patient taking differently: Place 1 spray into both nostrils See admin instructions. Instill 1 spray into each nostril two times a day as directed 06/04/18   Sood, Vineet, MD  benzonatate  (TESSALON ) 100 MG capsule Take 1 capsule (100 mg total) by mouth every 8 (eight) hours. 09/04/24   Dreama, [place]  N, FNP  cetirizine  (ZYRTEC  ALLERGY ) 10 MG tablet Take 1 tablet (10 mg total) by mouth daily. 08/11/23   Christopher Savannah, PA-C  dicyclomine (BENTYL) 20 MG tablet Take 20 mg by mouth 3 (three) times daily as needed. 02/22/24   [provider]  DULERA  200-5 MCG/ACT AERO SMARTSIG:1 Puff(s) By Mouth Every 12 Hours 07/12/23   [provider]  DULoxetine (CYMBALTA) 30 MG capsule Take 1 tablet by mouth daily.    [provider]  fluticasone  (FLONASE ) 50 MCG/ACT nasal spray Place 2 sprays into both nostrils daily. 06/09/23   [provider]  gabapentin (NEURONTIN) 100 MG capsule Take 100 mg by mouth daily at 6 (six) AM.    [provider]  ibuprofen  (ADVIL ) 600 MG tablet Take 1 tablet (600 mg total) by mouth every 6 (six) hours as needed. 12/03/22   Lynwood Lenis, PA-C  ipratropium (ATROVENT ) 0.03 % nasal spray Place 2 sprays into both nostrils 3 (three) times daily. 12/28/21   Marinda Rocky SAILOR, MD  loratadine  (CLARITIN ) 10 MG tablet Take 10 mg  by mouth daily.    [provider]  meclizine  (ANTIVERT ) 12.5 MG tablet Take 1 tablet (12.5 mg total) by mouth 3 (three) times daily as needed for dizziness. 08/11/23   Christopher Savannah, PA-C  methocarbamol  (ROBAXIN ) 500 MG tablet Take 1 tablet (500 mg total) by mouth 2 (two) times daily as needed for muscle spasms. Patient not taking: Reported on 01/07/2024 06/22/23   Hazen Darryle BRAVO, FNP  mometasone -formoterol  (DULERA ) 100-5 MCG/ACT AERO Inhale 2 puffs into the lungs 2 (two) times daily. 05/31/22   Marinda Rocky SAILOR, MD  montelukast  (SINGULAIR ) 10 MG tablet Take 10 mg by mouth at bedtime. 02/19/24   [provider]  oxybutynin   (DITROPAN ) 5 MG tablet Take 5 mg by mouth 2 (two) times daily. 11/07/21   [provider]  oxybutynin  (DITROPAN -XL) 10 MG 24 hr tablet Take 10 mg by mouth daily. 10/15/23   [provider]  pantoprazole  (PROTONIX ) 40 MG tablet Take 1 tablet (40 mg total) by mouth 2 (two) times daily. 02/22/22   Marinda Rocky SAILOR, MD  polyethylene glycol powder (GLYCOLAX /MIRALAX ) 17 GM/SCOOP powder Take 17 g by mouth daily. 03/07/24   Nivia Colon, PA-C  pseudoephedrine  (SUDAFED) 30 MG tablet Take 1 tablet (30 mg total) by mouth every 4 (four) hours as needed for congestion. 09/04/24   Dreama, [place]  N, FNP  sucralfate  (CARAFATE ) 1 g tablet Take 1 tablet (1 g total) by mouth 4 (four) times daily -  with meals and at bedtime. 03/07/24   Nivia Colon, PA-C  tiZANidine (ZANAFLEX) 4 MG tablet Take 8 mg by mouth at bedtime as needed. Patient not taking: Reported on 01/07/2024 08/13/23   [provider]  Vitamin D, Ergocalciferol, (DRISDOL) 1.25 MG (50000 UT) CAPS capsule Take 50,000 Units by mouth once a week. 05/16/19   [provider]    Allergies: Patient has no known allergies.    Review of Systems  Cardiovascular:  Positive for chest pain.  All other systems reviewed and are negative.   Updated Vital Signs BP (!) 140/77 (BP Location: Right Arm)   Pulse 71   Temp 98.7 F (37.1 C) (Oral)   Resp 20   Ht 5' 6 (1.676 m)   Wt 108.8 kg   SpO2 100%   BMI 38.71 kg/m   Physical Exam Vitals and nursing note reviewed.  HENT:     Mouth/Throat:     Mouth: Mucous membranes are moist.     Dentition: Does not have dentures. Dental tenderness, gingival swelling and dental caries present. No dental abscesses or gum lesions.     Pharynx: Oropharynx is clear. Uvula midline. No oropharyngeal exudate, posterior oropharyngeal erythema, uvula swelling or postnasal drip.     Tonsils: No tonsillar exudate or tonsillar abscesses.      Comments: Multiple missing teeth on both the upper and lower.   Tenderness to palpation over the left jaw.  Some cheek swelling noted.  No abscesses noted.  No signs of infection. Cardiovascular:     Rate and Rhythm: Normal rate.     Pulses: Normal pulses.  Pulmonary:     Effort: Pulmonary effort is normal.     Breath sounds: Normal breath sounds.  Chest:     Comments: No tenderness to palpation anywhere on the chest. Abdominal:     General: Abdomen is flat. Bowel sounds are normal.     Palpations: Abdomen is soft.  Skin:    General: Skin is warm and dry.  Neurological:  General: No focal deficit present.     Mental Status: She is alert.     (all labs ordered are listed, but only abnormal results are displayed) Labs Reviewed  CBC - Abnormal; Notable for the following components:      Result Value   MCH 25.2 (*)    All other components within normal limits  BASIC METABOLIC PANEL WITH GFR  TROPONIN T, HIGH SENSITIVITY  TROPONIN T, HIGH SENSITIVITY    EKG: EKG Interpretation Date/Time:  Friday October 03 2024 18:32:22 EST Ventricular Rate:  68 PR Interval:  168 QRS Duration:  74 QT Interval:  386 QTC Calculation: 410 R Axis:   2  Text Interpretation: Normal sinus rhythm Normal ECG When compared with ECG of 23-Sep-2023 13:01, PREVIOUS ECG IS PRESENT Confirmed by Lorette Mayo [phone]) on 10/03/2024 11:11:37 PM  Radiology: [facility] Chest 2 View Result Date: 10/03/2024 EXAM: 2 VIEW(S) XRAY OF THE CHEST 10/03/2024 06:54:00 PM COMPARISON: 09/04/2024 CLINICAL HISTORY: chest pain FINDINGS: LUNGS AND PLEURA: No focal pulmonary opacity. No pleural effusion. No pneumothorax. HEART AND MEDIASTINUM: No acute abnormality of the cardiac and mediastinal silhouettes. BONES AND SOFT TISSUES: Mild multilevel degenerative changes of thoracic spine. No acute osseous abnormality. IMPRESSION: 1. No acute cardiopulmonary pathology. Electronically signed by: Franky Stanford MD 10/03/2024 07:28 PM EST RP Workstation: [id number]     Procedures   Medications Ordered  in the ED - No data to display                                  Medical Decision Making Amount and/or Complexity of Data Reviewed Labs: ordered. Radiology: ordered.   Impression: 54 year old female presenting with dental pain and chest pain.  Differential diagnoses include dental abscess, dental caries, endocarditis, PE, costochondritis MI, pneumothorax, pneumonia  Additional History: Patient was able to provide history.  I also reviewed other outpatient notes including cardiology visits from 2023.  Labs: CBC showed no signs of anemia or infection.  BMP showed no signs of electrolyte abnormalities, kidney dysfunction.  Troponin was less than 15 both times.  Imaging: Chest x-ray showed no signs of acute abnormalities.  EKG showed normal sinus rhythm.  I reviewed both images and agree with the interpretations.  ED Course/Meds: 54 year old female presenting to the ER for dental pain and chest pain.  Her chest pain started a few weeks before her dental surgery.  Because of this makes endocarditis less likely.  Patient was also seen back in 2023 for the same chest pain.  She had multiple test including a stress test and a calcium score which were all normal.  Her chest x-ray showed no signs of pneumonia or pneumothorax making these less likely.  Patient's pain was not reproducible and she has not had any recent URI symptoms making costochondritis less likely.  Patient is being referred to cardiology to follow-up for this constant chest pain.  Patient was also told to follow-up with her primary care if the pain continue. On physical exam there were no signs of any dental abscesses.  Patient still had stitches from dental surgery.  Patient is not currently on any antibiotics post dental surgery.  Because of this I started her on amoxicillin  for 7 days.  Patient was told to follow-up with her dentist if his pain continued.  Patient was given her first dose of amoxicillin  while in the ER.  Patient was  also given a shot of Toradol  to help  with the pain.  Patient was told when she gets home she can continue with Tylenol  and ibuprofen  as needed for pain.  Patient remained stable while in the ER and at discharge.       Final diagnoses:  None    ED Discharge Orders     None          Joyce Welch 10/04/24 0246    Haze Lonni PARAS, MD 10/04/24 [phone]  "

## 2024-10-04 NOTE — Discharge Instructions (Signed)
 Follow-up with your cardiology for your chest pain.  I also have provided information on a cardiologist with [facility].  You may also follow-up with your primary care if the symptoms continue.  If your dental pain continues follow-up with your dentist.  I have sent a antibiotic into your pharmacy.  Begin taking this whenever you pick it up.  You may also take Tylenol  and ibuprofen  for pain.  If you start to develop any worsening chest pain or shortness of breath return to the ER.  Alternating between 650 mg Tylenol  and 400 mg Advil : The best way to alternate taking Acetaminophen  (example Tylenol ) and Ibuprofen  (example Advil /Motrin ) is to take them 3 hours apart. For example, if you take ibuprofen  at 6 am you can then take Tylenol  at 9 am. You can continue this regimen throughout the day, making sure you do not exceed the recommended maximum dose for each drug.

## 2024-10-13 ENCOUNTER — Ambulatory Visit

## 2024-10-29 ENCOUNTER — Ambulatory Visit

## 2024-10-29 ENCOUNTER — Ambulatory Visit: Attending: Cardiology | Admitting: Cardiology

## 2024-10-29 VITALS — BP 112/68 | HR 97 | Ht 66.0 in | Wt 248.0 lb

## 2024-10-29 DIAGNOSIS — R079 Chest pain, unspecified: Secondary | ICD-10-CM | POA: Insufficient documentation

## 2024-10-29 DIAGNOSIS — R002 Palpitations: Secondary | ICD-10-CM

## 2024-10-29 NOTE — Progress Notes (Unsigned)
 Enrolled patient for a 14 day Zio XT  monitor to be mailed to patients home

## 2024-10-29 NOTE — Patient Instructions (Signed)
 Medication Instructions:  The current medical regimen is effective;  continue present plan and medications.  *If you need a refill on your cardiac medications before your next appointment, please call your pharmacy*  Testing/Procedures: ZIO XT- Long Term Monitor Instructions  Your physician has requested you wear a ZIO patch monitor for 14 days.  This is a single patch monitor. [employer] supplies one patch monitor per enrollment. Additional stickers are not available. Please do not apply patch if you will be having a Nuclear Stress Test,  Echocardiogram, Cardiac CT, MRI, or Chest Xray during the period you would be wearing the  monitor. The patch cannot be worn during these tests. You cannot remove and re-apply the  ZIO XT patch monitor.  Your ZIO patch monitor will be mailed 3 day USPS to your address on file. It may take 3-5 days  to receive your monitor after you have been enrolled.  Once you have received your monitor, please review the enclosed instructions. Your monitor  has already been registered assigning a specific monitor serial # to you.  Billing and Patient Assistance Program Information  We have supplied [employer] with any of your insurance information on file for billing purposes. [employer] offers a sliding scale Patient Assistance Program for patients that do not have  insurance, or whose insurance does not completely cover the cost of the ZIO monitor.  You must apply for the Patient Assistance Program to qualify for this discounted rate.  To apply, please call [employer] at [phone], select option 4, select option 2, ask to apply for  Patient Assistance Program. Meredeth will ask your household income, and how many people  are in your household. They will quote your out-of-pocket cost based on that information.  [employer] will also be able to set up a 34-month, interest-free payment plan if needed.  Applying the monitor   Shave hair from upper left chest.  Hold abrader disc  by orange tab. Rub abrader in 40 strokes over the upper left chest as  indicated in your monitor instructions.  Clean area with 4 enclosed alcohol pads. Let dry.  Apply patch as indicated in monitor instructions. Patch will be placed under collarbone on left  side of chest with arrow pointing upward.  Rub patch adhesive wings for 2 minutes. Remove white label marked 1. Remove the white  label marked 2. Rub patch adhesive wings for 2 additional minutes.  While looking in a mirror, press and release button in center of patch. A small green light will  flash 3-4 times. This will be your only indicator that the monitor has been turned on.  Do not shower for the first 24 hours. You may shower after the first 24 hours.  Press the button if you feel a symptom. You will hear a small click. Record Date, Time and  Symptom in the Patient Logbook.  When you are ready to remove the patch, follow instructions on the last 2 pages of Patient  Logbook. Stick patch monitor onto the last page of Patient Logbook.  Place Patient Logbook in the blue and white box. Use locking tab on box and tape box closed  securely. The blue and white box has prepaid postage on it. Please place it in the mailbox as  soon as possible. Your physician should have your test results approximately 7 days after the  monitor has been mailed back to [facility].  Call [facility] Customer Care at [phone] if you have questions regarding  your ZIO XT patch  monitor. Call them immediately if you see an orange light blinking on your  monitor.  If your monitor falls off in less than 4 days, contact our Monitor department at [phone].  If your monitor becomes loose or falls off after 4 days call [employer] at [phone] for  suggestions on securing your monitor   Follow-Up: At [facility], you and your health needs are our priority.  As part of our continuing mission to provide you with exceptional heart care,  our providers are all part of one team.  This team includes your primary Cardiologist (physician) and Advanced Practice Providers or APPs (Physician Assistants and Nurse Practitioners) who all work together to provide you with the care you need, when you need it.  Your next appointment:   Follow up will be based on the results of the above testing.  We recommend signing up for the patient portal called MyChart.  Sign up information is provided on this After Visit Summary.  MyChart is used to connect with patients for Virtual Visits (Telemedicine).  Patients are able to view lab/test results, encounter notes, upcoming appointments, etc.  Non-urgent messages can be sent to your provider as well.   To learn more about what you can do with MyChart, go to [email or web address].

## 2024-10-29 NOTE — Progress Notes (Signed)
" °  Cardiology Office Note:  .   Date:  10/29/2024  ID:  Joyce Welch, DOB 10/28/1970, MRN [id number] PCP: Shelda Atlas, MD  [facility] Providers Cardiologist:  None     History of Present Illness: .   Joyce Welch is a 54 y.o. female Discussed the use of AI scribe  History of Present Illness Joyce Welch is a 54 year old female who presents with recent chest pain.  Chest pain - Intermittent, squeezing chest pain with radiation to arm and leg, particularly when lying down. Felt like a palpitation - Pain was atypical for her, prompting emergency department visit on October 03, 2024 - No recurrence of the same level of discomfort since the emergency visit  Palpitations - Experiences palpitations described as 'little flutters or skips' in her chest - Finds these sensations concerning - Sensations were unusual for her  Emergency department evaluation - Troponin levels were normal - EKG showed no abnormalities  Cardiac imaging and laboratory findings - Coronary CT scan on December 20, 2021 showed calcium score of zero and normal coronary arteries - Echocardiogram on December 22, 2021 revealed mild systolic anterior motion of the mitral valve, ejection fraction of 75%, moderate concentric left ventricular hypertrophy, and mild mitral valve regurgitation - Creatinine was 0.89 and hemoglobin was 12.4 at that time     Studies Reviewed: .        Results Labs Creatinine: 0.89 Troponin: Less than 15 x2, Within normal limits Hemoglobin: 12.4  Radiology Coronary CT angiography (12/20/2021): Calcium score zero, normal coronary arteries  Diagnostic Echocardiogram (12/22/2021): Mild systolic anterior motion of mitral valve due to hyperdynamic contraction, ejection fraction 75%, moderate concentric left ventricular hypertrophy, mild mitral regurgitation EKG (10/03/2024): Normal, no ST segment changes, heart rate 68 bpm Risk Assessment/Calculations:             Physical Exam:   VS:  BP 112/68 (BP Location: Left Arm, Patient Position: Sitting, Cuff Size: Large)   Pulse 97   Ht 5' 6 (1.676 m)   Wt 248 lb (112.5 kg)   BMI 40.03 kg/m    Wt Readings from Last 3 Encounters:  10/29/24 248 lb (112.5 kg)  10/04/24 239 lb 13.8 oz (108.8 kg)  09/04/24 239 lb 13.8 oz (108.8 kg)    GEN: Well nourished, well developed in no acute distress NECK: No JVD; No carotid bruits CARDIAC: RRR, soft systolic murmurs, no rubs, no gallops RESPIRATORY:  Clear to auscultation without rales, wheezing or rhonchi  ABDOMEN: Soft, non-tender, non-distended EXTREMITIES:  No edema; No deformity   ASSESSMENT AND PLAN: .    Assessment and Plan Assessment & Plan Chest pain evaluation Intermittent chest pain radiating to the arm and leg, with palpitations. Recent emergency department visit ruled out myocardial infarction with normal troponin levels and EKG. Previous coronary CT scan showed no stenosis or calcium. Differential includes arrhythmia or benign premature ventricular contractions (PVCs). - Ordered Holter monitor for two weeks to evaluate for arrhythmias or PVCs.  Mitral regurgitation with left ventricular hypertrophy Mild mitral valve regurgitation and moderate concentric left ventricular hypertrophy noted on previous echocardiogram. No acute changes or symptoms suggestive of worsening condition.  Morbid obesity Continued encouragement for weight loss and adherence to a Mediterranean diet. - Encouraged weight loss and adherence to a Mediterranean diet.         Dispo: Will follow up with results of testing  Signed, Oneil Parchment, MD  "
